# Patient Record
Sex: Female | Born: 1945 | Race: White | Hispanic: No | Marital: Married | Smoking: Never smoker
Health system: Southern US, Community
[De-identification: ages and names within clinical notes are randomized; demographics above are authoritative.]

## PROBLEM LIST (undated history)

## (undated) DIAGNOSIS — M810 Age-related osteoporosis without current pathological fracture: Secondary | ICD-10-CM

## (undated) DIAGNOSIS — E1165 Type 2 diabetes mellitus with hyperglycemia: Secondary | ICD-10-CM

## (undated) DIAGNOSIS — E559 Vitamin D deficiency, unspecified: Secondary | ICD-10-CM

## (undated) DIAGNOSIS — I272 Pulmonary hypertension, unspecified: Secondary | ICD-10-CM

## (undated) DIAGNOSIS — C679 Malignant neoplasm of bladder, unspecified: Secondary | ICD-10-CM

## (undated) DIAGNOSIS — S0990XA Unspecified injury of head, initial encounter: Secondary | ICD-10-CM

## (undated) DIAGNOSIS — I10 Essential (primary) hypertension: Secondary | ICD-10-CM

## (undated) DIAGNOSIS — IMO0001 Reserved for inherently not codable concepts without codable children: Secondary | ICD-10-CM

## (undated) DIAGNOSIS — R413 Other amnesia: Secondary | ICD-10-CM

## (undated) DIAGNOSIS — E538 Deficiency of other specified B group vitamins: Secondary | ICD-10-CM

## (undated) DIAGNOSIS — M87180 Osteonecrosis due to drugs, jaw: Secondary | ICD-10-CM

## (undated) DIAGNOSIS — I480 Paroxysmal atrial fibrillation: Secondary | ICD-10-CM

## (undated) DIAGNOSIS — K219 Gastro-esophageal reflux disease without esophagitis: Secondary | ICD-10-CM

## (undated) DIAGNOSIS — I5031 Acute diastolic (congestive) heart failure: Secondary | ICD-10-CM

## (undated) DIAGNOSIS — E7800 Pure hypercholesterolemia, unspecified: Secondary | ICD-10-CM

## (undated) HISTORY — DX: Reserved for inherently not codable concepts without codable children: IMO0001

## (undated) HISTORY — DX: Type 2 diabetes mellitus with hyperglycemia: E11.65

## (undated) HISTORY — PX: CARDIAC CATHETERIZATION: SHX172

## (undated) HISTORY — DX: Deficiency of other specified B group vitamins: E53.8

## (undated) HISTORY — DX: Vitamin D deficiency, unspecified: E55.9

## (undated) HISTORY — DX: Osteonecrosis due to drugs, jaw: M87.180

## (undated) HISTORY — DX: Unspecified injury of head, initial encounter: S09.90XA

## (undated) HISTORY — DX: Other amnesia: R41.3

## (undated) HISTORY — DX: Malignant neoplasm of bladder, unspecified: C67.9

## (undated) HISTORY — DX: Essential (primary) hypertension: I10

## (undated) HISTORY — DX: Pure hypercholesterolemia, unspecified: E78.00

## (undated) HISTORY — DX: Age-related osteoporosis without current pathological fracture: M81.0

## (undated) HISTORY — PX: MASTECTOMY PARTIAL / LUMPECTOMY: SUR851

## (undated) HISTORY — PX: TONSILLECTOMY: SUR1361

## (undated) HISTORY — DX: Gastro-esophageal reflux disease without esophagitis: K21.9

---

## 1979-04-13 HISTORY — PX: APPENDECTOMY: SHX54

## 1979-04-13 HISTORY — PX: TOTAL ABDOMINAL HYSTERECTOMY: SHX209

## 1998-09-27 ENCOUNTER — Emergency Department (HOSPITAL_COMMUNITY): Admission: EM | Admit: 1998-09-27 | Discharge: 1998-09-27 | Payer: Self-pay | Admitting: Emergency Medicine

## 2010-11-08 ENCOUNTER — Encounter: Payer: Self-pay | Admitting: Cardiovascular Disease

## 2010-11-11 ENCOUNTER — Encounter: Payer: Self-pay | Admitting: Cardiovascular Disease

## 2010-11-17 LAB — PROTIME-INR

## 2010-11-27 LAB — PROTIME-INR

## 2010-12-04 LAB — PROTIME-INR

## 2010-12-13 ENCOUNTER — Encounter: Payer: Self-pay | Admitting: Cardiovascular Disease

## 2010-12-16 ENCOUNTER — Encounter: Payer: Self-pay | Admitting: Cardiovascular Disease

## 2010-12-17 ENCOUNTER — Ambulatory Visit (INDEPENDENT_AMBULATORY_CARE_PROVIDER_SITE_OTHER): Payer: Medicare HMO | Admitting: Cardiovascular Disease

## 2010-12-17 ENCOUNTER — Encounter: Payer: Self-pay | Admitting: Cardiovascular Disease

## 2010-12-17 DIAGNOSIS — I480 Paroxysmal atrial fibrillation: Secondary | ICD-10-CM

## 2010-12-17 DIAGNOSIS — I4891 Unspecified atrial fibrillation: Secondary | ICD-10-CM

## 2010-12-17 DIAGNOSIS — I10 Essential (primary) hypertension: Secondary | ICD-10-CM

## 2010-12-17 DIAGNOSIS — E782 Mixed hyperlipidemia: Secondary | ICD-10-CM

## 2010-12-17 DIAGNOSIS — E119 Type 2 diabetes mellitus without complications: Secondary | ICD-10-CM

## 2010-12-17 NOTE — Patient Instructions (Signed)
Your physician recommends that you schedule a follow-up appointment in: AS NEEDED  Your physician recommends that you continue on your current medications as directed. Please refer to the Current Medication list given to you today.  

## 2010-12-18 DIAGNOSIS — I480 Paroxysmal atrial fibrillation: Secondary | ICD-10-CM | POA: Insufficient documentation

## 2010-12-18 DIAGNOSIS — E782 Mixed hyperlipidemia: Secondary | ICD-10-CM | POA: Insufficient documentation

## 2010-12-18 DIAGNOSIS — E119 Type 2 diabetes mellitus without complications: Secondary | ICD-10-CM | POA: Insufficient documentation

## 2010-12-18 NOTE — Assessment & Plan Note (Signed)
Low carb diet  Target A1c 6.5 or less

## 2010-12-18 NOTE — Assessment & Plan Note (Signed)
F/U labs with Dr Tomasa Blase  Target LDL under 130

## 2010-12-18 NOTE — Assessment & Plan Note (Signed)
Resolved  Continue ASA and beta blocker Stop coumadin

## 2010-12-18 NOTE — Assessment & Plan Note (Signed)
Well controlled.  Continue current medications and low sodium Dash type diet.    

## 2010-12-18 NOTE — Progress Notes (Signed)
65 yo referred from [facility] in [place].  Reviewed records from [facility]  Long hospitalization in July and August for urosepsis with respiratory failure requiring intubation.  Had self limited episode aftrial fibrillation during this time.  Not clear if she converted in hospital or post D/C.  She has been on coumadin since.  [place] score 1.  No palpitations dyspnea or syncope.  Prior to hospitalization had no cardiac history.  Wants to get off coumadin.  Given low [place] score and NSR I think she should be off coumadin.  Arrhythmia clearly ppt by svere sepsis and multisystem failure.    ROS: Denies fever, malais, weight loss, blurry vision, decreased visual acuity, cough, sputum, SOB, hemoptysis, pleuritic pain, palpitaitons, heartburn, abdominal pain, melena, lower extremity edema, claudication, or rash.  All other systems reviewed and negative   General: Affect appropriate Healthy:  appears stated age HEENT: normal Neck supple with no adenopathy JVP normal no bruits no thyromegaly Lungs clear with no wheezing and good diaphragmatic motion Heart:  S1/S2 no murmur,rub, gallop or click PMI normal Abdomen: benighn, BS positve, no tenderness, no AAA no bruit.  No HSM or HJR Distal pulses intact with no bruits No edema Neuro non-focal Skin warm and dry No muscular weakness  Medications Current Outpatient Prescriptions  Medication Sig Dispense Refill  . amLODipine (NORVASC) 10 MG tablet Take 10 mg by mouth daily.        Marland Kitchen atorvastatin (LIPITOR) 10 MG tablet Take 10 mg by mouth daily.        Marland Kitchen glimepiride (AMARYL) 4 MG tablet Take 4 mg by mouth daily before breakfast.        . losartan (COZAAR) 100 MG tablet Take 100 mg by mouth daily.        . metFORMIN (GLUCOPHAGE) 1000 MG tablet Take 1,000 mg by mouth 2 (two) times daily with a meal.        . metoprolol (LOPRESSOR) 50 MG tablet Take 50 mg by mouth as directed. 1 1/2 po bid       . warfarin (COUMADIN) 3 MG tablet Take 3 mg by mouth as directed.           Allergies Ace inhibitors; Codeine; and Januvia  Family History: Family History  Problem Relation Age of Onset  . Coronary artery disease Mother   . Heart disease Father     CHF  . Diabetes Sister   . Thyroid disease Sister   . Arthritis Sister   . Diabetes Sister   . Diabetes Sister     Social History: History   Social History  . Marital Status: Married    Spouse Name: N/A    Number of Children: N/A  . Years of Education: N/A   Occupational History  . Not on file.   Social History Main Topics  . Smoking status: Never Smoker   . Smokeless tobacco: Not on file  . Alcohol Use: No  . Drug Use: No  . Sexually Active:    Other Topics Concern  . Not on file   Social History Narrative   Married 3 sonsCurrent work status : Unemployed, not looking for [employer] illicit drug useAlcohol use : No alcohol useTobacco use: Never smoke    Electrocardiogram:  NSR normal ECG  Assessment and Plan

## 2011-12-29 LAB — PULMONARY FUNCTION TEST

## 2012-01-20 ENCOUNTER — Encounter: Payer: Self-pay | Admitting: *Deleted

## 2012-01-21 ENCOUNTER — Encounter: Payer: Self-pay | Admitting: Internal Medicine

## 2012-01-21 ENCOUNTER — Ambulatory Visit (INDEPENDENT_AMBULATORY_CARE_PROVIDER_SITE_OTHER): Payer: Medicare Other | Admitting: Internal Medicine

## 2012-01-21 VITALS — BP 140/82 | HR 73 | Temp 97.8°F | Ht 64.5 in | Wt 177.2 lb

## 2012-01-21 DIAGNOSIS — R0989 Other specified symptoms and signs involving the circulatory and respiratory systems: Secondary | ICD-10-CM

## 2012-01-21 DIAGNOSIS — R0609 Other forms of dyspnea: Secondary | ICD-10-CM

## 2012-01-21 DIAGNOSIS — R942 Abnormal results of pulmonary function studies: Secondary | ICD-10-CM

## 2012-01-21 DIAGNOSIS — R0600 Dyspnea, unspecified: Secondary | ICD-10-CM

## 2012-01-21 NOTE — Progress Notes (Signed)
Subjective:    Patient ID: Ann Hunter, female    DOB: 1945/04/14, 66 y.o.   MRN: [id number]  HPI  PCP is HAMRICK,MAURA L, MD  Estimated Body mass index is 29.95 kg/(m^2) .  reports that she has never smoked. She does not have any smokeless tobacco history on file.   IOV 01/21/2012  Referred for dyspnea. Hx by her and husband; both not great historian  A year ago was hopsitalized with Acute vent dependent resp failure due [facility] was on coumadin but ? Due to A Fib. Saw [facility] cards and coumadin dc'ed. Been doing well since then but husband thinks she might have had insidious onset mild baseline dyspea (she denies this). Then a month ago, she ate some food she was allergic to at [employer] several weeks ago. Rushed to ER, found to hav angioedema of tongue (they are not sure if she was on ace inhibitor at that time but they do not think so). BP was 200s sbp. She was discharged on 7 day pednisone taper. Since then dyspneic. Walking in a grocery story makes her dyspneic. When dyspneic, symptoms are severe and she holds her chest due to discomfort that they deny is pain. Symptoms certainly relieved by rest. However, past week started on hydralazine and they think since then dyspnea might be better but not sure. PFTs (below) at PMD office show restriction with low diffusion . CXR at [facility] reported as low lung volumes (N/A for my review). However, walking desat test here no desaturations    FVC fev1 ratio BD fev1 TLC DLCO comment  12/29/2011 2.1L/69% 1.6L/69% 77/100% Noone 3L/59% 14/25% Loop & effort ok     Past Medical History  Diagnosis Date  . Osteoporosis, unspecified   . Pure hypercholesterolemia   . Essential hypertension, benign   . Unspecified vitamin D deficiency   . Head injury, unspecified   . Type II or unspecified type diabetes mellitus without mention of complication, uncontrolled   . Atrial fibrillation   . B12 deficiency   . Closed  head injury   . Memory difficulty     d/t head injury     Family History  Problem Relation Age of Onset  . Coronary artery disease Mother   . Heart disease Father     CHF  . Diabetes Sister   . Thyroid disease Sister   . Arthritis Sister   . Diabetes Sister   . Diabetes Sister      History   Social History  . Marital Status: Married    Spouse Name: N/A    Number of Children: N/A  . Years of Education: N/A   Occupational History  . Not on file.   Social History Main Topics  . Smoking status: Never Smoker   . Smokeless tobacco: Not on file  . Alcohol Use: No  . Drug Use: No  . Sexually Active:    Other Topics Concern  . Not on file   Social History Narrative   Married 3 sonsCurrent work status : Unemployed, not looking for [employer] illicit drug useAlcohol use : No alcohol useTobacco use: Never smoke     Allergies  Allergen Reactions  . Codeine Shortness Of Breath  . Ace Inhibitors   . Januvia (Sitagliptin Phosphate)      Outpatient Prescriptions Prior to Visit  Medication Sig Dispense Refill  . glimepiride (AMARYL) 4 MG tablet Take 4 mg by mouth daily before breakfast.        .  losartan (COZAAR) 100 MG tablet Take 100 mg by mouth daily.        . metFORMIN (GLUCOPHAGE) 1000 MG tablet Take 1,000 mg by mouth 2 (two) times daily with a meal.        . amLODipine (NORVASC) 10 MG tablet Take 10 mg by mouth daily.        Marland Kitchen atorvastatin (LIPITOR) 10 MG tablet Take 10 mg by mouth daily.        . metoprolol (LOPRESSOR) 50 MG tablet Take 50 mg by mouth as directed. 1 1/2 po bid       . warfarin (COUMADIN) 3 MG tablet Take 3 mg by mouth as directed.             Review of Systems  Constitutional: Negative for fever and unexpected weight change.  HENT: Negative for ear pain, nosebleeds, congestion, sore throat, rhinorrhea, sneezing, trouble swallowing, dental problem, postnasal drip and sinus pressure.   Eyes: Negative for redness and itching.  Respiratory: Positive  for shortness of breath. Negative for cough, chest tightness and wheezing.   Cardiovascular: Negative for palpitations and leg swelling.  Gastrointestinal: Negative for nausea and vomiting.  Genitourinary: Negative for dysuria.  Musculoskeletal: Negative for joint swelling.  Skin: Negative for rash.  Neurological: Negative for headaches.  Hematological: Does not bruise/bleed easily.  Psychiatric/Behavioral: Negative for dysphoric mood. The patient is not nervous/anxious.    Filed Vitals:   01/21/12 1330  Height: 5' 4.5" (1.638 m)  Weight: 80.377 kg (177 lb 3.2 oz)   Filed Vitals:   01/21/12 1330  BP: 140/82  Pulse: 73  Temp: 97.8 F (36.6 C)  TempSrc: Oral  Height: 5' 4.5" (1.638 m)  Weight: 80.377 kg (177 lb 3.2 oz)  SpO2: 95%       Objective:   Physical Exam  Vitals reviewed. Constitutional: She is oriented to person, place, and time. She appears well-developed and well-nourished. No distress.       Body mass index is 29.95 kg/(m^2).   HENT:  Head: Normocephalic and atraumatic.  Right Ear: External ear normal.  Left Ear: External ear normal.  Mouth/Throat: Oropharynx is clear and moist. No oropharyngeal exudate.  Eyes: Conjunctivae normal and EOM are normal. Pupils are equal, round, and reactive to light. Right eye exhibits no discharge. Left eye exhibits no discharge. No scleral icterus.  Neck: Normal range of motion. Neck supple. No JVD present. No tracheal deviation present. No thyromegaly present.       Post nasal drip +  Cardiovascular: Normal rate, regular rhythm, normal heart sounds and intact distal pulses.  Exam reveals no gallop and no friction rub.   No murmur heard. Pulmonary/Chest: Effort normal and breath sounds normal. No respiratory distress. She has no wheezes. She has no rales. She exhibits no tenderness.  Abdominal: Soft. Bowel sounds are normal. She exhibits no distension and no mass. There is no tenderness. There is no rebound and no guarding.    Musculoskeletal: Normal range of motion. She exhibits no edema and no tenderness.       OA +  Lymphadenopathy:    She has no cervical adenopathy.  Neurological: She is alert and oriented to person, place, and time. She has normal reflexes. No cranial nerve deficit. She exhibits normal muscle tone. Coordination normal.  Skin: Skin is warm and dry. No rash noted. She is not diaphoretic. No erythema. No pallor.  Psychiatric: She has a normal mood and affect. Her behavior is normal. Judgment and thought content  normal.          Assessment & Plan:

## 2012-01-21 NOTE — Assessment & Plan Note (Signed)
Poor historian but has recent onset class 2 doe with ? Chest pain. PFTs show low dlco and restriction suggestive of ILD but she did not desaturate nor did I hear crackles. Will get CT chest ILD protocol. If negative, will ensure no coronar issues. IF that too is negative, will need pulmnary stress test with EIB challenge. Will call with CT results

## 2012-01-21 NOTE — Patient Instructions (Addendum)
pleaes have CT scan chest Will call you with results If these are negative, you might have to see cardiology or do pulmonary stress test

## 2012-01-27 ENCOUNTER — Ambulatory Visit (INDEPENDENT_AMBULATORY_CARE_PROVIDER_SITE_OTHER)
Admission: RE | Admit: 2012-01-27 | Discharge: 2012-01-27 | Disposition: A | Discharge status: Home / Self Care | Payer: Medicare Other | Source: Ambulatory Visit | Attending: Internal Medicine | Admitting: Internal Medicine

## 2012-01-27 DIAGNOSIS — R0600 Dyspnea, unspecified: Secondary | ICD-10-CM

## 2012-01-27 DIAGNOSIS — R942 Abnormal results of pulmonary function studies: Secondary | ICD-10-CM

## 2012-01-27 DIAGNOSIS — R0609 Other forms of dyspnea: Secondary | ICD-10-CM

## 2012-01-28 ENCOUNTER — Telehealth: Payer: Self-pay | Admitting: Internal Medicine

## 2012-01-28 DIAGNOSIS — R0600 Dyspnea, unspecified: Secondary | ICD-10-CM

## 2012-01-28 DIAGNOSIS — J849 Interstitial pulmonary disease, unspecified: Secondary | ICD-10-CM

## 2012-01-28 DIAGNOSIS — R079 Chest pain, unspecified: Secondary | ICD-10-CM

## 2012-01-28 NOTE — Telephone Encounter (Signed)
Ct chest 10/17.13 shows slight scarring in lungs  - so do autoimmine blood work; will call with results  Also, the ct and pft findings do not explain her symptoms completely. She has been clutching chest so refer [facility] cardiology  Thanks  MR (orders done)   Ct Chest Wo Contrast  01/27/2012  *RADIOLOGY REPORT*  Clinical Data: SOB, abnormal PFTs, evaluate for interstitial lung disease  CT CHEST WITHOUT CONTRAST  Technique:  Multidetector CT imaging of the chest was performed following the standard protocol without IV contrast.  Comparison: Chest radiographs dated 12/23/2011.  Findings: Very mild subpleural reticulation in the bilateral lower lobes.  No associated traction bronchiectasis or honeycombing.  No focal consolidation.  No suspicious pulmonary nodules. No pleural effusion or pneumothorax.  On expiratory / expiratory imaging, there is very mild air trapping.  Heart is normal in size.  No pericardial effusion.  Coronary atherosclerosis.  Atherosclerotic calcifications of the aortic arch.  No suspicious mediastinal or axillary lymphadenopathy.  Visualized upper abdomen is unremarkable.  Mild degenerative changes of the visualized thoracolumbar spine.  IMPRESSION: Very mild subpleural reticulation in the bilateral lower lobes, possibly reflecting very early chronic interstitial lung disease, nonspecific.  Associated mild air trapping.  No findings to suggest end-stage lung disease/advanced idiopathic pulmonary fibrosis.   Original Report Authenticated By: Charline Bills, M.D.

## 2012-02-01 NOTE — Telephone Encounter (Signed)
Pt advised. Jennifer Castillo, CMA  

## 2012-02-01 NOTE — Telephone Encounter (Signed)
[id number].Jennifer Castillo, CMA  

## 2012-02-02 ENCOUNTER — Other Ambulatory Visit (INDEPENDENT_AMBULATORY_CARE_PROVIDER_SITE_OTHER): Payer: Medicare Other

## 2012-02-02 DIAGNOSIS — R0600 Dyspnea, unspecified: Secondary | ICD-10-CM

## 2012-02-02 DIAGNOSIS — R0989 Other specified symptoms and signs involving the circulatory and respiratory systems: Secondary | ICD-10-CM

## 2012-02-02 DIAGNOSIS — R079 Chest pain, unspecified: Secondary | ICD-10-CM

## 2012-02-02 DIAGNOSIS — J8410 Pulmonary fibrosis, unspecified: Secondary | ICD-10-CM

## 2012-02-02 DIAGNOSIS — J849 Interstitial pulmonary disease, unspecified: Secondary | ICD-10-CM

## 2012-02-02 DIAGNOSIS — R0609 Other forms of dyspnea: Secondary | ICD-10-CM

## 2012-02-02 LAB — CK: Total CK: 35 U/L (ref 7–177)

## 2012-02-02 LAB — SEDIMENTATION RATE: Sed Rate: 21 mm/hr (ref 0–22)

## 2012-02-03 LAB — ANCA SCREEN W REFLEX TITER
Atypical p-ANCA Screen: NEGATIVE
p-ANCA Screen: NEGATIVE

## 2012-02-03 LAB — CYCLIC CITRUL PEPTIDE ANTIBODY, IGG: Cyclic Citrullin Peptide Ab: <2 U/mL (ref 0.0–5.0)

## 2012-02-03 LAB — SJOGRENS SYNDROME-B EXTRACTABLE NUCLEAR ANTIBODY: SSB (La) (ENA) Antibody, IgG: <1 AU/mL (ref <30)

## 2012-02-07 ENCOUNTER — Encounter: Payer: Self-pay | Admitting: Cardiology

## 2012-02-08 LAB — HYPERSENSITIVITY PNUEMONITIS PROFILE

## 2012-02-15 ENCOUNTER — Telehealth: Payer: Self-pay | Admitting: Internal Medicine

## 2012-02-15 NOTE — Telephone Encounter (Signed)
appt set for 03-07-12.Carron Curie, CMA

## 2012-02-15 NOTE — Telephone Encounter (Signed)
Autoimmune panel 02/02/12 normal. They should see me after they see cards on 02/25/12. Give them appt

## 2012-02-25 ENCOUNTER — Ambulatory Visit (INDEPENDENT_AMBULATORY_CARE_PROVIDER_SITE_OTHER): Payer: Medicare Other | Admitting: Cardiology

## 2012-02-25 ENCOUNTER — Encounter: Payer: Self-pay | Admitting: Cardiology

## 2012-02-25 VITALS — BP 134/62 | HR 78 | Ht 64.0 in | Wt 159.0 lb

## 2012-02-25 DIAGNOSIS — R0609 Other forms of dyspnea: Secondary | ICD-10-CM

## 2012-02-25 DIAGNOSIS — I480 Paroxysmal atrial fibrillation: Secondary | ICD-10-CM

## 2012-02-25 DIAGNOSIS — I4891 Unspecified atrial fibrillation: Secondary | ICD-10-CM

## 2012-02-25 DIAGNOSIS — R0600 Dyspnea, unspecified: Secondary | ICD-10-CM

## 2012-02-25 DIAGNOSIS — E782 Mixed hyperlipidemia: Secondary | ICD-10-CM

## 2012-02-25 DIAGNOSIS — I10 Essential (primary) hypertension: Secondary | ICD-10-CM

## 2012-02-25 NOTE — Progress Notes (Signed)
HPI: pleasant female previously seen by Dr. Eden Emms for followup of dyspnea. Patient apparently had an episode of atrial fibrillation in the setting of urosepsis previously. Dr. Eden Emms discontinued her Coumadin as he felt her atrial fibrillation was due to her acute illness and her CHADS score was 1. Chest CT in October 2013 showed possible early interstitial lung disease. Pulmonary function tests showed low DLCO and restriction suggestive of interstitial lung disease. Patient recently had episodes of dyspnea that she attributes to a blood pressure medication. Her symptoms have improved. She denies dyspnea on exertion unless extreme activities. No orthopnea, PND, pedal edema, palpitations, syncope or chest pain. We were asked to evaluate for her dyspnea.  Current Outpatient Prescriptions  Medication Sig Dispense Refill  . carvedilol (COREG) 25 MG tablet Take 25 mg by mouth 2 (two) times daily with a meal.      . glimepiride (AMARYL) 4 MG tablet Take 4 mg by mouth 2 (two) times daily.       . hydrALAZINE (APRESOLINE) 25 MG tablet Take 1 tablet by mouth Twice daily.      Marland Kitchen losartan (COZAAR) 100 MG tablet Take 100 mg by mouth daily.        . magnesium oxide (MAG-OX) 400 MG tablet Take 400 mg by mouth daily.      . metFORMIN (GLUCOPHAGE) 1000 MG tablet Take 1,000 mg by mouth 2 (two) times daily with a meal.        . pravastatin (PRAVACHOL) 20 MG tablet Take 1 tablet by mouth Daily.      . vitamin B-12 (CYANOCOBALAMIN) 1000 MCG tablet Take 1,000 mcg by mouth 2 (two) times daily.         Past Medical History  Diagnosis Date  . Osteoporosis, unspecified   . Pure hypercholesterolemia   . Essential hypertension, benign   . Unspecified vitamin D deficiency   . Head injury, unspecified   . Type II or unspecified type diabetes mellitus without mention of complication, uncontrolled   . Atrial fibrillation   . B12 deficiency   . Closed head injury   . Memory difficulty     d/t head injury    Past  Surgical History  Procedure Date  . Appendectomy 1981  . Tonsillectomy   . Total abdominal hysterectomy     History   Social History  . Marital Status: Married    Spouse Name: N/A    Number of Children: N/A  . Years of Education: N/A   Occupational History  . Not on file.   Social History Main Topics  . Smoking status: Never Smoker   . Smokeless tobacco: Not on file  . Alcohol Use: No  . Drug Use: No  . Sexually Active:    Other Topics Concern  . Not on file   Social History Narrative   Married 3 sonsCurrent work status : Unemployed, not looking for [employer] illicit drug useAlcohol use : No alcohol useTobacco use: Never smoke    ROS: no fevers or chills, productive cough, hemoptysis, dysphasia, odynophagia, melena, hematochezia, dysuria, hematuria, rash, seizure activity, orthopnea, PND, pedal edema, claudication. Remaining systems are negative.  Physical Exam: Well-developed well-nourished in no acute distress.  Skin is warm and dry.  HEENT is normal.  Neck is supple.  Chest is clear to auscultation with normal expansion.  Cardiovascular exam is regular rate and rhythm.  Abdominal exam nontender or distended. No masses palpated. Extremities show no edema. neuro grossly intact  ECG 12/22/2011-sinus rhythm with nonspecific  lateral T-wave changes.

## 2012-02-25 NOTE — Patient Instructions (Addendum)
Your physician recommends that you schedule a follow-up appointment in: AS NEEDED PENDING TEST RESULTS  Your physician has requested that you have en exercise stress myoview. For further information please visit [email or web address]. Please follow instruction sheet, as given.    

## 2012-02-25 NOTE — Assessment & Plan Note (Signed)
Patient had only one episode that occurred in the setting of urosepsis.

## 2012-02-25 NOTE — Assessment & Plan Note (Signed)
Blood pressure controlled. Continue present medications. 

## 2012-02-25 NOTE — Assessment & Plan Note (Signed)
Continue statin. Management per primary care. 

## 2012-02-25 NOTE — Assessment & Plan Note (Signed)
Symptoms appear to be improving after changing blood pressure medications. However she has 20 years of diabetes mellitus. Plan stress Myoview to quantify LV function and to exclude ischemia.

## 2012-02-29 ENCOUNTER — Telehealth: Payer: Self-pay | Admitting: Cardiology

## 2012-02-29 NOTE — Telephone Encounter (Signed)
Spoke with pt, not sure who called. Her questions answered.

## 2012-02-29 NOTE — Telephone Encounter (Signed)
New Problem:    Patient returned a call.  Please call back.

## 2012-03-07 ENCOUNTER — Ambulatory Visit: Payer: Medicare Other | Admitting: Internal Medicine

## 2012-03-13 ENCOUNTER — Ambulatory Visit (HOSPITAL_COMMUNITY): Payer: Medicare Other | Attending: Cardiology | Admitting: Radiology

## 2012-03-13 VITALS — BP 200/90 | Ht 64.0 in | Wt 157.0 lb

## 2012-03-13 DIAGNOSIS — Z8249 Family history of ischemic heart disease and other diseases of the circulatory system: Secondary | ICD-10-CM | POA: Insufficient documentation

## 2012-03-13 DIAGNOSIS — I10 Essential (primary) hypertension: Secondary | ICD-10-CM | POA: Insufficient documentation

## 2012-03-13 DIAGNOSIS — I4891 Unspecified atrial fibrillation: Secondary | ICD-10-CM | POA: Insufficient documentation

## 2012-03-13 DIAGNOSIS — R0600 Dyspnea, unspecified: Secondary | ICD-10-CM

## 2012-03-13 DIAGNOSIS — R0602 Shortness of breath: Secondary | ICD-10-CM

## 2012-03-13 DIAGNOSIS — E119 Type 2 diabetes mellitus without complications: Secondary | ICD-10-CM | POA: Insufficient documentation

## 2012-03-13 MED ORDER — TECHNETIUM TC 99M SESTAMIBI GENERIC - CARDIOLITE
33.0000 | Freq: Once | INTRAVENOUS | Status: AC | PRN
  Administered 2012-03-13: 33 via INTRAVENOUS

## 2012-03-13 MED ORDER — REGADENOSON 0.4 MG/5ML IV SOLN
0.4000 mg | Freq: Once | INTRAVENOUS | Status: AC
  Administered 2012-03-13: 0.4 mg via INTRAVENOUS

## 2012-03-13 MED ORDER — TECHNETIUM TC 99M SESTAMIBI GENERIC - CARDIOLITE
11.0000 | Freq: Once | INTRAVENOUS | Status: AC | PRN
  Administered 2012-03-13: 11 via INTRAVENOUS

## 2012-03-13 NOTE — Progress Notes (Signed)
[facility] SITE 3 NUCLEAR MED [id number] [place] [address] [phone]  Cardiology Nuclear Med Study  Ann Hunter is a 66 y.o. female     MRN : [id number]     DOB: 05-03-1945  Procedure Date: 03/13/2012  Nuclear Med Background Indication for Stress Test:  Evaluation for Ischemia and Abnormal EKG:12/22/11 with nonspecific lateral waves changes History:  AFIB Cardiac Risk Factors: Family History - CAD, Hypertension, Lipids and NIDDM  Symptoms:  SOB   Nuclear Pre-Procedure Caffeine/Decaff Intake:  None NPO After: 7:00pm   Lungs:  clear O2 Sat: 95% on room air. IV 0.9% NS with Angio Cath:  22g  IV Site: R Wrist  IV Started by:  Cathlyn Parsons, RN  Chest Size (in):  42 Cup Size: D  Height: 5\' 4"  (1.626 m)  Weight:  157 lb (71.215 kg)  BMI:  Body mass index is 26.95 kg/(m^2). Tech Comments:  Coreg held x 14 hrs    Nuclear Med Study 1 or 2 day study: 1 day  Stress Test Type:  Lexiscan  Reading MD: Willa Rough, MD  Order Authorizing Provider:  Ripley Fraise  Resting Radionuclide: Technetium 79m Sestamibi  Resting Radionuclide Dose: 11.0 mCi   Stress Radionuclide:  Technetium 26m Sestamibi  Stress Radionuclide Dose: 33.0 mCi           Stress Protocol Rest HR: 72 Stress HR: 94  Rest BP: 200/90 Stress BP: 180/76  Exercise Time (min): n/a METS: n/a   Predicted Max HR: 153 bpm % Max HR: 61.44 bpm Rate Pressure Product: 62952   Dose of Adenosine (mg):  n/a Dose of Lexiscan: 0.4 mg  Dose of Atropine (mg): n/a Dose of Dobutamine: n/a mcg/kg/min (at max HR)  Stress Test Technologist: Frederick Peers, EMT-P  Nuclear Technologist:  Domenic Polite, CNMT     Rest Procedure:  Myocardial perfusion imaging was performed at rest 45 minutes following the intravenous administration of Technetium 20m Sestamibi. Rest ECG: NSR - Normal EKG  Stress Procedure:  The patient received IV Lexiscan 0.4 mg over 15-seconds.  Technetium 69m  Tetrofosmin injected at 30-seconds.  There were no significant changes with Lexiscan.  Quantitative spect images were obtained after a 45 minute delay. Stress ECG: No significant change from baseline ECG  QPS Raw Data Images:  Normal; no motion artifact; normal heart/lung ratio. Stress Images:  Normal homogeneous uptake in all areas of the myocardium. Rest Images:  Normal homogeneous uptake in all areas of the myocardium. Subtraction (SDS):  No evidence of ischemia. Transient Ischemic Dilatation (Normal <1.22):  0.93 Lung/Heart Ratio (Normal <0.45):  0.20  Quantitative Gated Spect Images QGS EDV:  80 ml QGS ESV:  22 ml  Impression Exercise Capacity:  Lexiscan with no exercise. BP Response:  Normal blood pressure response. Clinical Symptoms:  nausea ECG Impression:  No significant ST segment change suggestive of ischemia. Comparison with Prior Nuclear Study: No images to compare  Overall Impression:  Normal stress nuclear study.  LV Ejection Fraction: 73%.  LV Wall Motion:  Normal Wall Motion.  Willa Rough, MD

## 2012-03-15 ENCOUNTER — Telehealth: Payer: Self-pay | Admitting: Cardiology

## 2012-03-15 NOTE — Telephone Encounter (Signed)
New Problem:    Patient returned your call about her test results.  Please call back.

## 2012-03-15 NOTE — Telephone Encounter (Signed)
Spoke with pt, aware of nuclear results 

## 2012-03-27 ENCOUNTER — Encounter: Payer: Self-pay | Admitting: Internal Medicine

## 2012-03-27 ENCOUNTER — Ambulatory Visit (INDEPENDENT_AMBULATORY_CARE_PROVIDER_SITE_OTHER): Payer: Medicare Other | Admitting: Internal Medicine

## 2012-03-27 VITALS — BP 140/80 | HR 70 | Temp 97.7°F | Ht 64.5 in | Wt 162.8 lb

## 2012-03-27 DIAGNOSIS — R0609 Other forms of dyspnea: Secondary | ICD-10-CM

## 2012-03-27 DIAGNOSIS — R0600 Dyspnea, unspecified: Secondary | ICD-10-CM

## 2012-03-27 NOTE — Assessment & Plan Note (Signed)
Was probably related to ace inhibitor allergy. Basal mild scarring on CT oct 2013 probably due to VDRF in 2012. Autoimmune negative. STress test negative cardiac in 2013.  Currently dyspnea resolved  Plan Dc from activ followup

## 2012-03-27 NOTE — Progress Notes (Signed)
Subjective:    Patient ID: Ann Hunter, female    DOB: 05/21/1945, 66 y.o.   MRN: [id number]  HPI PCP is HAMRICK,MAURA L, MD  Estimated Body mass index is 29.95 kg/(m^2) .  reports that she has never smoked. She does not have any smokeless tobacco history on file.   IOV 01/21/2012  Referred for dyspnea. Hx by her and husband; both not great historian  A year ago was hopsitalized with Acute vent dependent resp failure due to [facility]. Post dc was on coumadin but ? Due to A Fib. Saw [facility] cards and coumadin dc'ed. Been doing well since then but husband thinks she might have had insidious onset mild baseline dyspea (she denies this). Then a month ago, she ate some food she was allergic to at [employer] several weeks ago. Rushed to ER, found to hav angioedema of tongue (they are not sure if she was on ace inhibitor at that time but they do not think so). BP was 200s sbp. She was discharged on 7 day pednisone taper. Since then dyspneic. Walking in a grocery story makes her dyspneic. When dyspneic, symptoms are severe and she holds her chest due to discomfort that they deny is pain. Symptoms certainly relieved by rest. However, past week started on hydralazine and they think since then dyspnea might be better but not sure. PFTs (below) at PMD office show restriction with low diffusion . CXR at [place] reported as low lung volumes (N/A for my review). However, walking desat test here no desaturations    FVC fev1 ratio BD fev1 TLC DLCO comment  12/29/2011 2.1L/69% 1.6L/69% 77/100% Noone 3L/59% 14/25% Loop & effort ok   TElephone call 01/28/12 REC Ct chest 10/17.13 shows slight scarring in lungs  - so do autoimmine blood work; will call with results  Also, the ct and pft findings do not explain her symptoms completely. She has been clutching chest so refer [place] cardiology  Thanks  MR   OV 03/27/2012   FU dyspnea   Since last visit: had PFT - showed  isolated low diffusion. So we did CT- showed basal scarring. Autoimmune tests negative. So, scarring likely due to VDRF a year ago. Then referred to cardiology - had stress test earlier this month - reportedly normal "heart of a 66 year old".  Currently dyspnea is resolved.  She feels PMD stopping ace inibitor just before her first visit here in October made the difference. She denies associatd cough, wheeze, edema, orthopnea, paroxysmal nocturnal dyspnea.   Past, Family, Social reviewed: new right ankle strain - thanksgiving 2013  Review of Systems  Constitutional: Negative for fever and unexpected weight change.  HENT: Negative for ear pain, nosebleeds, congestion, sore throat, rhinorrhea, sneezing, trouble swallowing, dental problem, postnasal drip and sinus pressure.   Eyes: Negative for redness and itching.  Respiratory: Negative for cough, chest tightness, shortness of breath and wheezing.   Cardiovascular: Negative for palpitations and leg swelling.  Gastrointestinal: Negative for nausea and vomiting.  Genitourinary: Negative for dysuria.  Musculoskeletal: Negative for joint swelling.  Skin: Negative for rash.  Neurological: Negative for headaches.  Hematological: Does not bruise/bleed easily.  Psychiatric/Behavioral: Negative for dysphoric mood. The patient is not nervous/anxious.        Objective:   Physical Exam Vitals reviewed. Constitutional: She is oriented to person, place, and time. She appears well-developed and well-nourished. No distress.      Body mass index is 27.51 kg/(m^2).  HENT:  Head: Normocephalic and atraumatic.  Right Ear: External ear normal.  Left Ear: External ear normal.  Mouth/Throat: Oropharynx is clear and moist. No oropharyngeal exudate.  Eyes: Conjunctivae normal and EOM are normal. Pupils are equal, round, and reactive to light. Right eye exhibits no discharge. Left eye exhibits no discharge. No scleral icterus.  Neck: Normal range of motion.  Neck supple. No JVD present. No tracheal deviation present. No thyromegaly present.       Post nasal drip resolved +  Cardiovascular: Normal rate, regular rhythm, normal heart sounds and intact distal pulses.  Exam reveals no gallop and no friction rub.   No murmur heard. Pulmonary/Chest: Effort normal and breath sounds normal. No respiratory distress. She has no wheezes. She has no rales. She exhibits no tenderness.  Abdominal: Soft. Bowel sounds are normal. She exhibits no distension and no mass. There is no tenderness. There is no rebound and no guarding.  Musculoskeletal: Normal range of motion. She exhibits no edema and no tenderness.  RT ANKLE IN CAST +      OA +  Lymphadenopathy:    She has no cervical adenopathy.  Neurological: She is alert and oriented to person, place, and time. She has normal reflexes. No cranial nerve deficit. She exhibits normal muscle tone. Coordination normal.  Skin: Skin is warm and dry. No rash noted. She is not diaphoretic. No erythema. No pallor.  Psychiatric: She has a normal mood and affect. Her behavior is normal. Judgment and thought content normal.          Assessment & Plan:

## 2012-03-27 NOTE — Patient Instructions (Addendum)
Glad your shortness of breath resolved after stopping ace inhibitor medications CT chest shows some scarring probably related to the pneumonia you had in 2012 THere is no need for followup here unless you get breathing problems again

## 2013-10-14 ENCOUNTER — Inpatient Hospital Stay (HOSPITAL_COMMUNITY)
Admission: EM | Admit: 2013-10-14 | Discharge: 2013-10-16 | DRG: 872 | Disposition: A | Discharge status: Home / Self Care | Payer: Medicare Other | Attending: Internal Medicine | Admitting: Internal Medicine

## 2013-10-14 ENCOUNTER — Encounter (HOSPITAL_COMMUNITY): Payer: Self-pay | Admitting: Emergency Medicine

## 2013-10-14 ENCOUNTER — Emergency Department (HOSPITAL_COMMUNITY): Payer: Medicare Other

## 2013-10-14 DIAGNOSIS — Z833 Family history of diabetes mellitus: Secondary | ICD-10-CM

## 2013-10-14 DIAGNOSIS — E538 Deficiency of other specified B group vitamins: Secondary | ICD-10-CM | POA: Diagnosis present

## 2013-10-14 DIAGNOSIS — I4891 Unspecified atrial fibrillation: Secondary | ICD-10-CM | POA: Diagnosis present

## 2013-10-14 DIAGNOSIS — E559 Vitamin D deficiency, unspecified: Secondary | ICD-10-CM | POA: Diagnosis present

## 2013-10-14 DIAGNOSIS — Z888 Allergy status to other drugs, medicaments and biological substances status: Secondary | ICD-10-CM

## 2013-10-14 DIAGNOSIS — N10 Acute tubulo-interstitial nephritis: Secondary | ICD-10-CM | POA: Diagnosis present

## 2013-10-14 DIAGNOSIS — Z8249 Family history of ischemic heart disease and other diseases of the circulatory system: Secondary | ICD-10-CM

## 2013-10-14 DIAGNOSIS — M810 Age-related osteoporosis without current pathological fracture: Secondary | ICD-10-CM | POA: Diagnosis present

## 2013-10-14 DIAGNOSIS — K219 Gastro-esophageal reflux disease without esophagitis: Secondary | ICD-10-CM | POA: Diagnosis present

## 2013-10-14 DIAGNOSIS — I10 Essential (primary) hypertension: Secondary | ICD-10-CM

## 2013-10-14 DIAGNOSIS — Z880 Allergy status to penicillin: Secondary | ICD-10-CM

## 2013-10-14 DIAGNOSIS — Z8261 Family history of arthritis: Secondary | ICD-10-CM

## 2013-10-14 DIAGNOSIS — I480 Paroxysmal atrial fibrillation: Secondary | ICD-10-CM

## 2013-10-14 DIAGNOSIS — I129 Hypertensive chronic kidney disease with stage 1 through stage 4 chronic kidney disease, or unspecified chronic kidney disease: Secondary | ICD-10-CM | POA: Diagnosis present

## 2013-10-14 DIAGNOSIS — E7800 Pure hypercholesterolemia, unspecified: Secondary | ICD-10-CM | POA: Diagnosis present

## 2013-10-14 DIAGNOSIS — A419 Sepsis, unspecified organism: Secondary | ICD-10-CM | POA: Diagnosis present

## 2013-10-14 DIAGNOSIS — Z9089 Acquired absence of other organs: Secondary | ICD-10-CM

## 2013-10-14 DIAGNOSIS — A4151 Sepsis due to Escherichia coli [E. coli]: Principal | ICD-10-CM | POA: Diagnosis present

## 2013-10-14 DIAGNOSIS — E119 Type 2 diabetes mellitus without complications: Secondary | ICD-10-CM | POA: Diagnosis present

## 2013-10-14 DIAGNOSIS — E782 Mixed hyperlipidemia: Secondary | ICD-10-CM

## 2013-10-14 DIAGNOSIS — E871 Hypo-osmolality and hyponatremia: Secondary | ICD-10-CM | POA: Diagnosis present

## 2013-10-14 DIAGNOSIS — E11319 Type 2 diabetes mellitus with unspecified diabetic retinopathy without macular edema: Secondary | ICD-10-CM

## 2013-10-14 DIAGNOSIS — N182 Chronic kidney disease, stage 2 (mild): Secondary | ICD-10-CM | POA: Diagnosis present

## 2013-10-14 DIAGNOSIS — Q619 Cystic kidney disease, unspecified: Secondary | ICD-10-CM

## 2013-10-14 DIAGNOSIS — R0600 Dyspnea, unspecified: Secondary | ICD-10-CM

## 2013-10-14 DIAGNOSIS — E785 Hyperlipidemia, unspecified: Secondary | ICD-10-CM | POA: Diagnosis present

## 2013-10-14 DIAGNOSIS — Z79899 Other long term (current) drug therapy: Secondary | ICD-10-CM

## 2013-10-14 DIAGNOSIS — N179 Acute kidney failure, unspecified: Secondary | ICD-10-CM | POA: Diagnosis present

## 2013-10-14 DIAGNOSIS — N200 Calculus of kidney: Secondary | ICD-10-CM | POA: Diagnosis present

## 2013-10-14 DIAGNOSIS — R6510 Systemic inflammatory response syndrome (SIRS) of non-infectious origin without acute organ dysfunction: Secondary | ICD-10-CM | POA: Diagnosis present

## 2013-10-14 DIAGNOSIS — Z882 Allergy status to sulfonamides status: Secondary | ICD-10-CM

## 2013-10-14 DIAGNOSIS — N12 Tubulo-interstitial nephritis, not specified as acute or chronic: Secondary | ICD-10-CM | POA: Diagnosis present

## 2013-10-14 HISTORY — DX: Paroxysmal atrial fibrillation: I48.0

## 2013-10-14 LAB — URINE MICROSCOPIC-ADD ON

## 2013-10-14 LAB — COMPREHENSIVE METABOLIC PANEL
ALBUMIN: 3.3 g/dL — AB (ref 3.5–5.2)
ALT: 9 U/L (ref 0–35)
ANION GAP: 19 — AB (ref 5–15)
AST: 16 U/L (ref 0–37)
Alkaline Phosphatase: 63 U/L (ref 39–117)
BUN: 32 mg/dL — AB (ref 6–23)
CALCIUM: 9.6 mg/dL (ref 8.4–10.5)
CO2: 21 mEq/L (ref 19–32)
CREATININE: 1.51 mg/dL — AB (ref 0.50–1.10)
Chloride: 95 mEq/L — ABNORMAL LOW (ref 96–112)
GFR calc Af Amer: 40 mL/min — ABNORMAL LOW (ref >90)
GFR calc non Af Amer: 34 mL/min — ABNORMAL LOW (ref >90)
Glucose, Bld: 260 mg/dL — ABNORMAL HIGH (ref 70–99)
Potassium: 4.9 mEq/L (ref 3.7–5.3)
Sodium: 135 mEq/L — ABNORMAL LOW (ref 137–147)
TOTAL PROTEIN: 6.7 g/dL (ref 6.0–8.3)
Total Bilirubin: 0.6 mg/dL (ref 0.3–1.2)

## 2013-10-14 LAB — URINALYSIS, ROUTINE W REFLEX MICROSCOPIC
Bilirubin Urine: NEGATIVE
Hgb urine dipstick: NEGATIVE
KETONES UR: 15 mg/dL — AB
NITRITE: NEGATIVE
PH: 6 (ref 5.0–8.0)
Protein, ur: 30 mg/dL — AB
SPECIFIC GRAVITY, URINE: 1.023 (ref 1.005–1.030)
Urobilinogen, UA: 0.2 mg/dL (ref 0.0–1.0)

## 2013-10-14 LAB — I-STAT CG4 LACTIC ACID, ED: Lactic Acid, Venous: 1.82 mmol/L (ref 0.5–2.2)

## 2013-10-14 LAB — CBC WITH DIFFERENTIAL/PLATELET
BASOS ABS: 0 10*3/uL (ref 0.0–0.1)
BASOS PCT: 0 % (ref 0–1)
EOS ABS: 0 10*3/uL (ref 0.0–0.7)
EOS PCT: 0 % (ref 0–5)
HEMATOCRIT: 34.9 % — AB (ref 36.0–46.0)
Hemoglobin: 11.3 g/dL — ABNORMAL LOW (ref 12.0–15.0)
Lymphocytes Relative: 5 % — ABNORMAL LOW (ref 12–46)
Lymphs Abs: 0.7 10*3/uL (ref 0.7–4.0)
MCH: 26.8 pg (ref 26.0–34.0)
MCHC: 32.4 g/dL (ref 30.0–36.0)
MCV: 82.7 fL (ref 78.0–100.0)
MONO ABS: 2.2 10*3/uL — AB (ref 0.1–1.0)
Monocytes Relative: 17 % — ABNORMAL HIGH (ref 3–12)
Neutro Abs: 10 10*3/uL — ABNORMAL HIGH (ref 1.7–7.7)
Neutrophils Relative %: 78 % — ABNORMAL HIGH (ref 43–77)
PLATELETS: 270 10*3/uL (ref 150–400)
RBC: 4.22 MIL/uL (ref 3.87–5.11)
RDW: 15 % (ref 11.5–15.5)
WBC: 12.9 10*3/uL — AB (ref 4.0–10.5)

## 2013-10-14 LAB — GLUCOSE, CAPILLARY
GLUCOSE-CAPILLARY: 230 mg/dL — AB (ref 70–99)
Glucose-Capillary: 203 mg/dL — ABNORMAL HIGH (ref 70–99)

## 2013-10-14 MED ORDER — ENOXAPARIN SODIUM 40 MG/0.4ML ~~LOC~~ SOLN
40.0000 mg | SUBCUTANEOUS | Status: DC
  Administered 2013-10-14 – 2013-10-15 (×2): 40 mg via SUBCUTANEOUS
  Filled 2013-10-14 (×3): qty 0.4

## 2013-10-14 MED ORDER — SACCHAROMYCES BOULARDII 250 MG PO CAPS
250.0000 mg | ORAL_CAPSULE | Freq: Two times a day (BID) | ORAL | Status: DC
  Administered 2013-10-14 – 2013-10-16 (×4): 250 mg via ORAL
  Filled 2013-10-14 (×5): qty 1

## 2013-10-14 MED ORDER — ACETAMINOPHEN 325 MG PO TABS: 650.0000 mg | ORAL_TABLET | Freq: Four times a day (QID) | ORAL | Status: DC | PRN

## 2013-10-14 MED ORDER — SIMVASTATIN 10 MG PO TABS
10.0000 mg | ORAL_TABLET | Freq: Every day | ORAL | Status: DC
  Administered 2013-10-14 – 2013-10-15 (×2): 10 mg via ORAL
  Filled 2013-10-14 (×3): qty 1

## 2013-10-14 MED ORDER — ACETAMINOPHEN 650 MG RE SUPP: 650.0000 mg | Freq: Four times a day (QID) | RECTAL | Status: DC | PRN

## 2013-10-14 MED ORDER — INSULIN ASPART 100 UNIT/ML ~~LOC~~ SOLN
0.0000 [IU] | Freq: Every day | SUBCUTANEOUS | Status: DC
  Administered 2013-10-14: 2 [IU] via SUBCUTANEOUS
  Administered 2013-10-15: 3 [IU] via SUBCUTANEOUS

## 2013-10-14 MED ORDER — ONDANSETRON HCL 4 MG/2ML IJ SOLN: 4.0000 mg | Freq: Four times a day (QID) | INTRAMUSCULAR | Status: DC | PRN

## 2013-10-14 MED ORDER — ONDANSETRON HCL 4 MG PO TABS: 4.0000 mg | ORAL_TABLET | Freq: Four times a day (QID) | ORAL | Status: DC | PRN

## 2013-10-14 MED ORDER — SODIUM CHLORIDE 0.9 % IV SOLN
INTRAVENOUS | Status: DC
  Administered 2013-10-14 – 2013-10-16 (×3): via INTRAVENOUS

## 2013-10-14 MED ORDER — INSULIN ASPART 100 UNIT/ML ~~LOC~~ SOLN
0.0000 [IU] | Freq: Three times a day (TID) | SUBCUTANEOUS | Status: DC
  Administered 2013-10-14: 3 [IU] via SUBCUTANEOUS
  Administered 2013-10-15: 7 [IU] via SUBCUTANEOUS
  Administered 2013-10-15: 3 [IU] via SUBCUTANEOUS
  Administered 2013-10-15: 2 [IU] via SUBCUTANEOUS
  Administered 2013-10-16: 3 [IU] via SUBCUTANEOUS

## 2013-10-14 MED ORDER — DEXTROSE 5 % IV SOLN: 1.0000 g | INTRAVENOUS | Status: DC

## 2013-10-14 MED ORDER — SODIUM CHLORIDE 0.9 % IV BOLUS (SEPSIS)
1000.0000 mL | Freq: Once | INTRAVENOUS | Status: AC
  Administered 2013-10-14: 1000 mL via INTRAVENOUS

## 2013-10-14 MED ORDER — CIPROFLOXACIN IN D5W 400 MG/200ML IV SOLN
400.0000 mg | Freq: Two times a day (BID) | INTRAVENOUS | Status: DC
  Administered 2013-10-14 – 2013-10-16 (×4): 400 mg via INTRAVENOUS
  Filled 2013-10-14 (×5): qty 200

## 2013-10-14 MED ORDER — DEXTROSE 5 % IV SOLN
1.0000 g | Freq: Once | INTRAVENOUS | Status: AC
  Administered 2013-10-14: 1 g via INTRAVENOUS
  Filled 2013-10-14: qty 10

## 2013-10-14 MED ORDER — ONDANSETRON HCL 4 MG/2ML IJ SOLN
4.0000 mg | Freq: Once | INTRAMUSCULAR | Status: AC
  Administered 2013-10-14: 4 mg via INTRAVENOUS
  Filled 2013-10-14: qty 2

## 2013-10-14 MED ORDER — CARVEDILOL 25 MG PO TABS
25.0000 mg | ORAL_TABLET | Freq: Two times a day (BID) | ORAL | Status: DC
  Administered 2013-10-14 – 2013-10-16 (×4): 25 mg via ORAL
  Filled 2013-10-14 (×6): qty 1

## 2013-10-14 MED ORDER — GI COCKTAIL ~~LOC~~
30.0000 mL | Freq: Three times a day (TID) | ORAL | Status: DC | PRN
  Filled 2013-10-14: qty 30

## 2013-10-14 MED ORDER — PANTOPRAZOLE SODIUM 40 MG PO TBEC
40.0000 mg | DELAYED_RELEASE_TABLET | Freq: Two times a day (BID) | ORAL | Status: DC
  Administered 2013-10-14 – 2013-10-16 (×4): 40 mg via ORAL
  Filled 2013-10-14 (×4): qty 1

## 2013-10-14 NOTE — ED Notes (Signed)
Pt c/o right sided flank pain x 2 weeks, went to PCP last Wednesday and was dx with a UTI and put on doxycyline, pt sts she has taken a few doses but doesn't feel any better yet. sts that she went to the doctor because the pain had increased and she noticed urine in her blood. Pt sts the flank pain and dysuria is intermittent. sts she had a fever at home of 100.1 earlier, denies taking tylenol today, but sts she had taken 1 tylenol a day for the past couple of days for pain. Hx of urosepsis 2 years ago. Nad, skin warm and dry, resp e/u.

## 2013-10-14 NOTE — H&P (Addendum)
[facility] History and Physical  Ann Hunter TDV:[id number] DOB: [birth year] DOA: 10/14/2013  Referring physician:  Blanchie Dessert PCP:  Lillard Anes, MD   Chief Complaint:  Flank pain  HPI:  The patient is a 68 y.o. 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  The patient was last at their baseline health 5 days ago.  She developed hematuria with 8/10 lower abdominal pain which was constant and crampy and was seen the same day by her PCP.  In clinic her urine dipstick was positive and she was started on doxycycline which she started taking immediately.  Despite her antibiotic, she developed nausea, fevers to 101.27F (this AM) and chills, worsening right flank pain with radiation to the right side and RLQ, bladder pain.  Her hematuria resolved.  At baseline, she ambulates without difficulty but she has become progressively weak and was not able to walk without assistance today so her husband convinced her to come to the ER.  Today, she has had two episodes of watery diarrhea.  She continues to have intermittent urgency/frequency and dysuria. Two years ago, she was hospitalized at [facility] with septic shock secondary to pyelonephritis and almost died.  In the emergency department, her vital signs were notable for mildly low blood pressure of 109/48, temperature 99, other vital signs stable. Labs notable for white blood cell count of 12.9, hemoglobin 11.3, sodium 135, BUN 32, creatinine 1.51, glucose 260. Lactic acid 1.82.  Assistant is to count WBCs, moderate leukocyte esterase, few bacteria, greater than 1000 glucose, 15 ketones.  CT scan demonstrated bilateral nonobstructing renal calculi up to 12 mm on the left and 2 mm on the right without hydronephrosis. Chest had a 2.5 cm mildly hyperdense anterior interpolar left renal cysts that  may have some solid component. Radiologist is recommending followup ultrasound.  Review of Systems:  General:  + fevers, chills, denies weight loss or gain HEENT:  Denies changes to hearing and vision, rhinorrhea, sinus congestion, sore throat CV:  Denies chest pain and palpitations, lower extremity edema.  PULM:  Denies SOB, wheezing, cough.   GI:  Per HPI   GU:  Per HPI ENDO:  Denies polyuria, polydipsia.   HEME:  Denies hematemesis, blood in stools, melena, abnormal bruising or bleeding.  LYMPH:  Denies lymphadenopathy.   MSK:  Denies arthralgias, myalgias.   DERM:  Denies skin rash or ulcer.   NEURO:  Denies focal numbness, weakness, slurred speech, confusion, facial droop.  PSYCH:  Denies anxiety and depression.    Past Medical History  Diagnosis Date  . Osteoporosis, unspecified   . Pure hypercholesterolemia   . Essential hypertension, benign   . Unspecified vitamin D deficiency   . Head injury, unspecified   . Type II or unspecified type diabetes mellitus without mention of complication, uncontrolled   . Paroxysmal atrial fibrillation     during septic shock, none since  . B12 deficiency   . Closed head injury   . Memory difficulty     d/t head injury   Past Surgical History  Procedure Laterality Date  . Appendectomy  1981  . Tonsillectomy    . Total abdominal hysterectomy  1981  . Cardiac catheterization     Social History:  reports that she has never smoked. She does not have any smokeless tobacco history on file. She reports that she does not drink alcohol or use illicit drugs.  Lives with husband, no assist devices.   Allergies  Allergen Reactions  . Codeine Shortness Of Breath  . Ace Inhibitors   . Advil [Ibuprofen]   . Aleve [Naproxen Sodium]   . Boniva [Ibandronic Acid]     Swelling   . Clonidine Derivatives   . Januvia [Sitagliptin Phosphate]   . Penicillins     Swelling   . Sulfa Antibiotics     Throat swells    Family History  Problem  Relation Age of Onset  . Coronary artery disease Mother     MI  . Heart disease Father     CHF  . Diabetes Sister   . Thyroid disease Sister   . Arthritis Sister   . Diabetes Sister   . Diabetes Sister      Prior to Admission medications   Medication Sig Start Date End Date Taking? Authorizing Provider  Canagliflozin (INVOKANA) 100 MG TABS Take 100 mg by mouth daily.   Yes Historical Provider, MD  carvedilol (COREG) 25 MG tablet Take 25 mg by mouth 2 (two) times daily with a meal.   Yes Historical Provider, MD  doxycycline (VIBRAMYCIN) 100 MG capsule Take 100 mg by mouth 2 (two) times daily. For 10 days. Started 10/10/2013   Yes Historical Provider, MD  glimepiride (AMARYL) 4 MG tablet Take 4 mg by mouth 2 (two) times daily.    Yes Historical Provider, MD  hydrALAZINE (APRESOLINE) 25 MG tablet Take 1 tablet by mouth Twice daily. 01/18/12  Yes Historical Provider, MD  losartan (COZAAR) 100 MG tablet Take 100 mg by mouth daily.     Yes Historical Provider, MD  magnesium oxide (MAG-OX) 400 MG tablet Take 400 mg by mouth daily.   Yes Historical Provider, MD  metFORMIN (GLUCOPHAGE) 1000 MG tablet Take 1,000 mg by mouth 2 (two) times daily with a meal.     Yes Historical Provider, MD  omeprazole (PRILOSEC) 20 MG capsule Take 20 mg by mouth daily.   Yes Historical Provider, MD  pravastatin (PRAVACHOL) 20 MG tablet Take 1 tablet by mouth Daily. 11/29/11  Yes Historical Provider, MD   Physical Exam: Filed Vitals:   10/14/13 1515 10/14/13 1614 10/14/13 1615 10/14/13 1645  BP: 109/48 118/53 107/57   Pulse: 81 80 80 83  Temp:      TempSrc:      Resp: 20 17 12 17   SpO2: 95% 93%  90%     General:  CF, NAD  Eyes:  PERRL, anicteric, non-injected.  ENT:  Nares clear.  OP clear, non-erythematous without plaques or exudates.  MMM.  Neck:  Supple without TM or JVD.    Lymph:  No cervical, supraclavicular, or submandibular LAD.  Cardiovascular:  RRR, normal S1, S2, without m/r/g.  2+ pulses,  warm extremities  Respiratory:  CTA bilaterally without increased WOB.  Abdomen:  NABS.  Soft, nondistended, currently nontender, + right flank TTP.  Skin:  No rashes or focal lesions.  Musculoskeletal:  Normal bulk and tone.  No LE edema.  Psychiatric:  A & O x 4.  Appropriate affect although poor insight into medical conditions  Neurologic:  CN 3-12 intact.  5/5 strength.  Sensation intact.  Labs on Admission:  Basic Metabolic Panel:  Recent Labs Lab 10/14/13 1410  NA 135*  K 4.9  CL 95*  CO2 21  GLUCOSE 260*  BUN 32*  CREATININE 1.51*  CALCIUM 9.6   Liver Function Tests:  Recent Labs Lab 10/14/13 1410  AST 16  ALT 9  ALKPHOS 63  BILITOT 0.6  PROT 6.7  ALBUMIN 3.3*   No results found for this basename: LIPASE, AMYLASE,  in the last 168 hours No results found for this basename: AMMONIA,  in the last 168 hours CBC:  Recent Labs Lab 10/14/13 1410  WBC 12.9*  NEUTROABS 10.0*  HGB 11.3*  HCT 34.9*  MCV 82.7  PLT 270   Cardiac Enzymes: No results found for this basename: CKTOTAL, CKMB, CKMBINDEX, TROPONINI,  in the last 168 hours  BNP (last 3 results) No results found for this basename: PROBNP,  in the last 8760 hours CBG: No results found for this basename: GLUCAP,  in the last 168 hours  Radiological Exams on Admission: Ct Abdomen Pelvis Wo Contrast  10/14/2013   CLINICAL DATA:  Right flank pain  EXAM: CT ABDOMEN AND PELVIS WITHOUT CONTRAST  TECHNIQUE: Multidetector CT imaging of the abdomen and pelvis was performed following the standard protocol without IV contrast.  COMPARISON:  None.  FINDINGS: Lung bases are essentially clear.  Liver is notable for a 14 mm probable cyst in the posterior segment right hepatic lobe (series 201/image 27).  Spleen, pancreas, adrenal glands are within normal limits.  Gallbladder is unremarkable. No intrahepatic or extrahepatic ductal dilatation.  2 mm nonobstructing right lower pole renal calculus (series 201/ image 46).  12 mm nonobstructing left lower pole renal calculus (series 201/image 40). 2.5 cm mildly hyperdense anterior interpolar probable left renal cyst (series 201/ image 38), although technically indeterminate. No hydronephrosis.  No evidence of bowel obstruction. Prior appendectomy. Colonic diverticulosis, without associated inflammatory changes.  Atherosclerotic calcifications of the abdominal aorta and branch vessels.  No abdominopelvic ascites.  No suspicious abdominopelvic lymphadenopathy.  No ureteral or bladder calculi.  Bladder is within normal limits.  Mild degenerative changes of the lower thoracic spine.  IMPRESSION: Bilateral nonobstructing renal calculi, measuring 12 mm on the left and 2 mm on the right. No hydronephrosis.  2.5 cm mildly hyperdense anterior interpolar probable left renal cyst, technically indeterminate. Given size, consider renal ultrasound for initial evaluation to assess for cystic versus solid mass.   Electronically Signed   By: Julian Hy M.D.   On: 10/14/2013 16:08    EKG: pending  Assessment/Plan Active Problems:   Pyelonephritis   Acute pyelonephritis  ---  Acute pyelonephritis, already received ceftriaxone but blood cultures not obtained prior to antibiotics.  Given lack of response to doxy, may have MDR organism.   -  Ciprofloxacin  -  If not improving, escalate to aztreonam -  Start florastor -  Followup urine culture -  Blood cultures x 2 -  Hold invokana in part b/c of increased risk of UTI due to increased glucose excretion in urine  Acute vs. Chronic kidney disease, likely AKI from dehydration  -  IVF -  Hold ARB -  Repeat in AM  Nephroliths without obstruction, monitor clinically for sx of passing kidney stone  Left renal cyst with possible solid component -  Recommend MRI after infection treated  PAF, occurred during septic shock a few years ago and has not recurred.  Currently sinus rhythm.  Followed by cardiology who recommended against  long-term anticoagulation since a-fib was felt to be caused by acute severe illness.  HTN/HLD, blood pressures low normal -  Hold ARB and hydralazine -  Continue carvedilol  T2DM, stable -  Recent A1c from PCP a few weeks ago and was told "very well controlled" -  Hold metformin and glimeperide  due to kidney function -  Hold invokana due to UTI -  Start sliding scale insulin  GERD, uncontrolled but some nausea may be secondary to pyelonephritis or from doxy. -  Start BID protonix -  GI cocktail  Watery diarrhea -  C. Diff  -  Enteric precautions  Leukocytosis and mild normocytic anemia due to pyelo, repeat CBC in AM  Hyponatremia may be due to dehydration  -  IVF and repeat Na in AM  Diet:  diabetic Access:  PIV IVF:  yes Proph:  lovenox  Code Status: full Family Communication: patient and her husband Disposition Plan: Admit to med-surg  Time spent: 60 min Janece Canterbury [facility] Pager [phone]  If 7PM-7AM, please contact night-coverage [email or web address] Password [facility] 10/14/2013, 5:37 PM

## 2013-10-14 NOTE — Progress Notes (Signed)
RECEIVED PT TO ROOM [id number] FROM ED, DENIES NAUSEA/PAIN AT THIS TIME, INSTRUCTED ON USAGE OF CALL LIGHT AND ROOM SURROUNDINGS, SON AT BEDSIDE.

## 2013-10-14 NOTE — ED Provider Notes (Signed)
CSN: [id number]     Arrival date & time 10/14/13  1329 History   First MD Initiated Contact with Patient 10/14/13 1359     Chief Complaint  Patient presents with  . Flank Pain     (Consider location/radiation/quality/duration/timing/severity/associated sxs/prior Treatment) HPI Comments: Pt with painless hematuria and flank pain on Wednesday of this week and saw PCP and dx with UTI. Pt started on doxycycline and has been taking that for 5 days however no improvement in sx.  Hematuria stopped but now for the last 2 days pt is having fever and chills, nausea without vomiting, right flank and suprapubic pain and decreased appetite  Patient is a 68 y.o. female presenting with flank pain. The history is provided by the patient and the spouse.  Flank Pain This is a new problem. Episode onset: 5 days ago. The problem occurs constantly. The problem has not changed since onset.Associated symptoms include abdominal pain. Pertinent negatives include no chest pain and no shortness of breath. Associated symptoms comments: Fever, hematuria, nausea and decreased appetite. Nothing aggravates the symptoms. Nothing relieves the symptoms. She has tried nothing for the symptoms.    Past Medical History  Diagnosis Date  . Osteoporosis, unspecified   . Pure hypercholesterolemia   . Essential hypertension, benign   . Unspecified vitamin D deficiency   . Head injury, unspecified   . Type II or unspecified type diabetes mellitus without mention of complication, uncontrolled   . Atrial fibrillation   . B12 deficiency   . Closed head injury   . Memory difficulty     d/t head injury   Past Surgical History  Procedure Laterality Date  . Appendectomy  1981  . Tonsillectomy    . Total abdominal hysterectomy     Family History  Problem Relation Age of Onset  . Coronary artery disease Mother   . Heart disease Father     CHF  . Diabetes Sister   . Thyroid disease Sister   . Arthritis Sister   . Diabetes  Sister   . Diabetes Sister    History  Substance Use Topics  . Smoking status: Never Smoker   . Smokeless tobacco: Not on file  . Alcohol Use: No   OB History   Grav Para Term Preterm Abortions TAB SAB Ect Mult Living                 Review of Systems  Constitutional: Positive for fever, chills and appetite change.  Respiratory: Negative for cough and shortness of breath.   Cardiovascular: Negative for chest pain.  Gastrointestinal: Positive for nausea and abdominal pain. Negative for vomiting and diarrhea.  Genitourinary: Positive for frequency and flank pain. Negative for dysuria.  All other systems reviewed and are negative.     Allergies  Codeine; Ace inhibitors; Advil; Aleve; Boniva; Clonidine derivatives; Januvia; and Penicillins  Home Medications   Prior to Admission medications   Medication Sig Start Date End Date Taking? Authorizing Provider  carvedilol (COREG) 25 MG tablet Take 25 mg by mouth 2 (two) times daily with a meal.    Historical Provider, MD  glimepiride (AMARYL) 4 MG tablet Take 4 mg by mouth 2 (two) times daily.     Historical Provider, MD  hydrALAZINE (APRESOLINE) 25 MG tablet Take 1 tablet by mouth Twice daily. 01/18/12   Historical Provider, MD  losartan (COZAAR) 100 MG tablet Take 100 mg by mouth daily.      Historical Provider, MD  magnesium oxide (MAG-OX) 400  MG tablet Take 400 mg by mouth daily.    Historical Provider, MD  metFORMIN (GLUCOPHAGE) 1000 MG tablet Take 1,000 mg by mouth 2 (two) times daily with a meal.      Historical Provider, MD  pravastatin (PRAVACHOL) 20 MG tablet Take 1 tablet by mouth Daily. 11/29/11   Historical Provider, MD  vitamin B-12 (CYANOCOBALAMIN) 1000 MCG tablet Take 1,000 mcg by mouth daily.     Historical Provider, MD   BP 105/47  Pulse 88  Temp(Src) 99 F (37.2 C) (Oral)  Resp 17  SpO2 92% Physical Exam  Nursing note and vitals reviewed. Constitutional: She is oriented to person, place, and time. She appears  well-developed and well-nourished. No distress.  HENT:  Head: Normocephalic and atraumatic.  Mouth/Throat: Oropharynx is clear and moist.  Eyes: Conjunctivae and EOM are normal. Pupils are equal, round, and reactive to light.  Neck: Normal range of motion. Neck supple.  Cardiovascular: Normal rate, regular rhythm and intact distal pulses.   No murmur heard. Pulmonary/Chest: Effort normal and breath sounds normal. No respiratory distress. She has no wheezes. She has no rales.  Abdominal: Soft. She exhibits no distension. There is no tenderness. There is CVA tenderness. There is no rebound and no guarding.  Right flank tenderness  Musculoskeletal: Normal range of motion. She exhibits no edema and no tenderness.  Neurological: She is alert and oriented to person, place, and time.  Skin: Skin is warm and dry. No rash noted. No erythema.  Psychiatric: She has a normal mood and affect. Her behavior is normal.    ED Course  Procedures (including critical care time) Labs Review Labs Reviewed  CBC WITH DIFFERENTIAL - Abnormal; Notable for the following:    WBC 12.9 (*)    Hemoglobin 11.3 (*)    HCT 34.9 (*)    Neutrophils Relative % 78 (*)    Neutro Abs 10.0 (*)    Lymphocytes Relative 5 (*)    Monocytes Relative 17 (*)    Monocytes Absolute 2.2 (*)    All other components within normal limits  COMPREHENSIVE METABOLIC PANEL - Abnormal; Notable for the following:    Sodium 135 (*)    Chloride 95 (*)    Glucose, Bld 260 (*)    BUN 32 (*)    Creatinine, Ser 1.51 (*)    Albumin 3.3 (*)    GFR calc non Af Amer 34 (*)    GFR calc Af Amer 40 (*)    Anion gap 19 (*)    All other components within normal limits  URINALYSIS, ROUTINE W REFLEX MICROSCOPIC - Abnormal; Notable for the following:    APPearance CLOUDY (*)    Glucose, UA >1000 (*)    Ketones, ur 15 (*)    Protein, ur 30 (*)    Leukocytes, UA MODERATE (*)    All other components within normal limits  URINE MICROSCOPIC-ADD ON -  Abnormal; Notable for the following:    Bacteria, UA FEW (*)    All other components within normal limits  URINE CULTURE  I-STAT CG4 LACTIC ACID, ED    Imaging Review Ct Abdomen Pelvis Wo Contrast  10/14/2013   CLINICAL DATA:  Right flank pain  EXAM: CT ABDOMEN AND PELVIS WITHOUT CONTRAST  TECHNIQUE: Multidetector CT imaging of the abdomen and pelvis was performed following the standard protocol without IV contrast.  COMPARISON:  None.  FINDINGS: Lung bases are essentially clear.  Liver is notable for a 14 mm probable cyst in  the posterior segment right hepatic lobe (series 201/image 27).  Spleen, pancreas, adrenal glands are within normal limits.  Gallbladder is unremarkable. No intrahepatic or extrahepatic ductal dilatation.  2 mm nonobstructing right lower pole renal calculus (series 201/ image 46). 12 mm nonobstructing left lower pole renal calculus (series 201/image 40). 2.5 cm mildly hyperdense anterior interpolar probable left renal cyst (series 201/ image 38), although technically indeterminate. No hydronephrosis.  No evidence of bowel obstruction. Prior appendectomy. Colonic diverticulosis, without associated inflammatory changes.  Atherosclerotic calcifications of the abdominal aorta and branch vessels.  No abdominopelvic ascites.  No suspicious abdominopelvic lymphadenopathy.  No ureteral or bladder calculi.  Bladder is within normal limits.  Mild degenerative changes of the lower thoracic spine.  IMPRESSION: Bilateral nonobstructing renal calculi, measuring 12 mm on the left and 2 mm on the right. No hydronephrosis.  2.5 cm mildly hyperdense anterior interpolar probable left renal cyst, technically indeterminate. Given size, consider renal ultrasound for initial evaluation to assess for cystic versus solid mass.   Electronically Signed   By: Julian Hy M.D.   On: 10/14/2013 16:08     EKG Interpretation None      MDM   Final diagnoses:  Pyelonephritis    Patient diagnosed with  a urinary tract infection on Wednesday and started on doxycycline. Since that time she's had progressive symptoms of fever, nausea and right flank pain.  Initially patient had blood in her urine which has improved her other symptoms are getting worse. She does have a significant history for urosepsis several years ago but does not get frequent urinary tract infections. She is hemodynamically stable on exam with right flank tenderness. No prior history of kidney stones.  Concern for ongoing UTI with pyelonephritis versus infected stone. No symptoms to suggest appendicitis, diverticulitis, pancreatitis or cholecystitis.  CBC, CMP, UA, lactate, CT without contrast of the abdomen and pelvis pending for further evaluation. Patient given IV fluids and nausea medication.  3:34 PM Leukocytosis and normal lactate.  Pt started on rocephin and will admit for pyelo.  CT neg for obstructing stones.  Will admit for further care.  Blanchie Dessert, MD 10/14/13 [phone]

## 2013-10-14 NOTE — ED Notes (Signed)
Pt. Stated,  I've had rt. Back pain.  i waas here on Wednesday with the same symptoms.

## 2013-10-14 NOTE — ED Notes (Signed)
Pt's bed switch back to [facility] not [facility].

## 2013-10-14 NOTE — ED Notes (Signed)
Attempted report to 5W. 

## 2013-10-15 DIAGNOSIS — R6510 Systemic inflammatory response syndrome (SIRS) of non-infectious origin without acute organ dysfunction: Secondary | ICD-10-CM

## 2013-10-15 DIAGNOSIS — E1139 Type 2 diabetes mellitus with other diabetic ophthalmic complication: Secondary | ICD-10-CM

## 2013-10-15 DIAGNOSIS — E11319 Type 2 diabetes mellitus with unspecified diabetic retinopathy without macular edema: Secondary | ICD-10-CM

## 2013-10-15 LAB — BASIC METABOLIC PANEL
ANION GAP: 13 (ref 5–15)
BUN: 29 mg/dL — ABNORMAL HIGH (ref 6–23)
CALCIUM: 9.2 mg/dL (ref 8.4–10.5)
CO2: 24 mEq/L (ref 19–32)
Chloride: 98 mEq/L (ref 96–112)
Creatinine, Ser: 1.59 mg/dL — ABNORMAL HIGH (ref 0.50–1.10)
GFR, EST AFRICAN AMERICAN: 37 mL/min — AB (ref >90)
GFR, EST NON AFRICAN AMERICAN: 32 mL/min — AB (ref >90)
GLUCOSE: 145 mg/dL — AB (ref 70–99)
POTASSIUM: 4 meq/L (ref 3.7–5.3)
SODIUM: 135 meq/L — AB (ref 137–147)

## 2013-10-15 LAB — CBC
HCT: 29.4 % — ABNORMAL LOW (ref 36.0–46.0)
Hemoglobin: 9.3 g/dL — ABNORMAL LOW (ref 12.0–15.0)
MCH: 26.5 pg (ref 26.0–34.0)
MCHC: 31.6 g/dL (ref 30.0–36.0)
MCV: 83.8 fL (ref 78.0–100.0)
PLATELETS: 241 10*3/uL (ref 150–400)
RBC: 3.51 MIL/uL — AB (ref 3.87–5.11)
RDW: 15.3 % (ref 11.5–15.5)
WBC: 9.4 10*3/uL (ref 4.0–10.5)

## 2013-10-15 LAB — GLUCOSE, CAPILLARY
Glucose-Capillary: 185 mg/dL — ABNORMAL HIGH (ref 70–99)
Glucose-Capillary: 327 mg/dL — ABNORMAL HIGH (ref 70–99)
Glucose-Capillary: 225 mg/dL — ABNORMAL HIGH (ref 70–99)
Glucose-Capillary: 297 mg/dL — ABNORMAL HIGH (ref 70–99)

## 2013-10-15 NOTE — Progress Notes (Signed)
Utilization review completed.  

## 2013-10-15 NOTE — Progress Notes (Signed)
PROGRESS NOTE  Ann Hunter FBP:[id number] DOB: 07-Aug-1945 DOA: 10/14/2013 PCP: Lillard Anes, MD  HPI/Subjective: Ann Hunter, 68 year old female, with past medical history significant for HTN, hyperlipidemia, DM type 2, AFib presented to the ED with right sided flank pain. Patient had hematuria and was diagnosed with a UTI 5 days prior by her PCP and was started on Doxycycline. Patient was not getting better and had two bouts of diarrhea yesterday so came to the ED. Two years ago, she was hospitalized at [facility] with septic shock secondary to pyelonephritis and almost died.  Today, patient states she is feeling much better. Still has right sided flank pain, but pain is improved. Denies nausea, vomiting, diarrhea, abdominal pain, chest pain, dyspnea.   Assessment/Plan: Principle Problem Acute Pyelonephritis -UA positive for UTI, has right sided CVA tenderness, admitted and started on Cipro on 7/5. Was on Doxycycline as outpatient -clinically improved, afebrile, leukocytosis decreasing. Continue, await Urine/Blood Cultures  Active Problems SIR's -secondary to above -better with IVF and IV Cipro  Acute Renal Failure -increase IVF to 125 cc/hr -continue to hold ARB -etiology likely secondary to ARB/Dehydration  Diarrhea  -resolved, no need to rule out C Diff  as diarrhea has completely resolved -c/w Florastor  HTN -Hold ARB and hydralazine -BP currently controlled with carvedilol  Type 2 DM -CBG's moderately controlled, c/w SSI while inpatient. Hold all oral hypoglycemic agents for now  GERD -c/w  BID protonix  -GI cocktail prn  Hyponatremia -Most likely due to low po intake and dehydration -Given IVF - continue to monitor   Nephroliths and Renal Cyst  -CT shows bilateral renal calculus and renal cyst  With possible solid component -Recommend MRI after infection treated  DVT Prophylaxis:  Lovenox   Code Status: Full Family Communication: Patient is alert  and oriented, a lot of family members at bedside Disposition Plan: Home when medically appropriate    Consultants:  None  Procedures:  None  Antibiotics: Antibiotics Given (last 72 hours)   Date/Time Action Medication Dose Rate   10/14/13 2123 Given   ciprofloxacin (CIPRO) IVPB 400 mg 400 mg 200 mL/hr   10/15/13 0843 Given   ciprofloxacin (CIPRO) IVPB 400 mg 400 mg 200 mL/hr      Objective: Filed Vitals:   10/14/13 1615 10/14/13 1645 10/14/13 1801 10/14/13 2032  BP: 107/57  105/60 136/74  Pulse: 80 83 77 84  Temp:   98.4 F (36.9 C) 99 F (37.2 C)  TempSrc:   Oral Oral  Resp: 12 17 20 20   Height:   5' 4.5" (1.638 m)   Weight:   68.448 kg (150 lb 14.4 oz)   SpO2:  90% 94% 93%   No intake or output data in the 24 hours ending 10/15/13 0840 Filed Weights   10/14/13 1801  Weight: 68.448 kg (150 lb 14.4 oz)    Exam: General: Well developed, well nourished, NAD, appears stated age  79:  Anicteic Sclera, MMM. No pharyngeal erythema or exudates  Neck: Supple, no masses  Cardiovascular:S1 S2 auscultated, no rubs, murmurs or gallops.   Respiratory: Clear to auscultation bilaterally with equal chest rise  Abdomen: Soft, nontender, nondistended, + bowel sounds, + Right CVA tenderness  Extremities: warm dry without cyanosis or edema.  Skin: Without rashes exudates or nodules.   Psych: Normal affect and demeanor with intact judgement and insight   Data Reviewed: Basic Metabolic Panel:  Recent Labs Lab 10/14/13 1410 10/15/13 0450  NA 135* 135*  K 4.9 4.0  CL 95* 98  CO2 21 24  GLUCOSE 260* 145*  BUN 32* 29*  CREATININE 1.51* 1.59*  CALCIUM 9.6 9.2   Liver Function Tests:  Recent Labs Lab 10/14/13 1410  AST 16  ALT 9  ALKPHOS 63  BILITOT 0.6  PROT 6.7  ALBUMIN 3.3*   No results found for this basename: LIPASE, AMYLASE,  in the last 168 hours No results found for this basename: AMMONIA,  in the last 168 hours CBC:  Recent Labs Lab  10/14/13 1410 10/15/13 0450  WBC 12.9* 9.4  NEUTROABS 10.0*  --   HGB 11.3* 9.3*  HCT 34.9* 29.4*  MCV 82.7 83.8  PLT 270 241   Cardiac Enzymes: No results found for this basename: CKTOTAL, CKMB, CKMBINDEX, TROPONINI,  in the last 168 hours BNP (last 3 results) No results found for this basename: PROBNP,  in the last 8760 hours CBG:  Recent Labs Lab 10/14/13 1809 10/14/13 2201 10/15/13 0800  GLUCAP 203* 230* 185*    No results found for this or any previous visit (from the past 240 hour(s)).   Studies: Ct Abdomen Pelvis Wo Contrast  10/14/2013   CLINICAL DATA:  Right flank pain  EXAM: CT ABDOMEN AND PELVIS WITHOUT CONTRAST  TECHNIQUE: Multidetector CT imaging of the abdomen and pelvis was performed following the standard protocol without IV contrast.  COMPARISON:  None.  FINDINGS: Lung bases are essentially clear.  Liver is notable for a 14 mm probable cyst in the posterior segment right hepatic lobe (series 201/image 27).  Spleen, pancreas, adrenal glands are within normal limits.  Gallbladder is unremarkable. No intrahepatic or extrahepatic ductal dilatation.  2 mm nonobstructing right lower pole renal calculus (series 201/ image 46). 12 mm nonobstructing left lower pole renal calculus (series 201/image 40). 2.5 cm mildly hyperdense anterior interpolar probable left renal cyst (series 201/ image 38), although technically indeterminate. No hydronephrosis.  No evidence of bowel obstruction. Prior appendectomy. Colonic diverticulosis, without associated inflammatory changes.  Atherosclerotic calcifications of the abdominal aorta and branch vessels.  No abdominopelvic ascites.  No suspicious abdominopelvic lymphadenopathy.  No ureteral or bladder calculi.  Bladder is within normal limits.  Mild degenerative changes of the lower thoracic spine.  IMPRESSION: Bilateral nonobstructing renal calculi, measuring 12 mm on the left and 2 mm on the right. No hydronephrosis.  2.5 cm mildly hyperdense  anterior interpolar probable left renal cyst, technically indeterminate. Given size, consider renal ultrasound for initial evaluation to assess for cystic versus solid mass.   Electronically Signed   By: Julian Hy M.D.   On: 10/14/2013 16:08    Scheduled Meds: . carvedilol  25 mg Oral BID WC  . ciprofloxacin  400 mg Intravenous Q12H  . enoxaparin (LOVENOX) injection  40 mg Subcutaneous Q24H  . insulin aspart  0-5 Units Subcutaneous QHS  . insulin aspart  0-9 Units Subcutaneous TID WC  . pantoprazole  40 mg Oral BID AC  . saccharomyces boulardii  250 mg Oral BID  . simvastatin  10 mg Oral q1800   Continuous Infusions: . sodium chloride 75 mL/hr at 10/14/13 1827    Active Problems:   Pyelonephritis   Acute pyelonephritis   Ferne Reus, PA-S   [facility] Pager [phone]. If 7PM-7AM, please contact night-coverage at [email or web address], password [facility] 10/15/2013, 8:40 AM  LOS: 1 day   Attending Patient was seen, examined,treatment plan was discussed with the Physician extender. I have directly reviewed the clinical findings, lab, imaging studies and management of this patient  in detail. I have made the necessary changes to the above noted documentation, and agree with the documentation, as recorded by the Physician extender.  Nena Alexander MD [facility].

## 2013-10-15 NOTE — Progress Notes (Signed)
Inpatient Diabetes Program Recommendations  AACE/ADA: New Consensus Statement on Inpatient Glycemic Control (2013)  Target Ranges:  Prepandial:   less than 140 mg/dL      Peak postprandial:   less than 180 mg/dL (1-2 hours)      Critically ill patients:  140 - 180 mg/dL   Reason for Assessment:  Results for Ann Hunter, Ann Hunter (MRN [id number]) as of 10/15/2013 13:37  Ref. Range 10/14/2013 18:09 10/14/2013 22:01 10/15/2013 08:00 10/15/2013 11:58  Glucose-Capillary Latest Range: 70-99 mg/dL 203 (H) 230 (H) 185 (H) 327 (H)   Diabetes history: Diabetes Outpatient Diabetes medications: Invokana 100 mg daily, Amaryl 4 mg bid, Metformin 1000 mg bid Current orders for Inpatient glycemic control:  Novolog sensitive tid with meals  Note CBG's elevated.  Oral medications currently on hold in the hospital.   Consider adding basal/bolus in the hospital while oral medications are on hold.  May consider Levemir 10 units daily and Novolog meal coverage 3 units tid with meals.    Thanks, Adah Perl, RN, BC-ADM Inpatient Diabetes Coordinator Pager [phone]

## 2013-10-16 DIAGNOSIS — K219 Gastro-esophageal reflux disease without esophagitis: Secondary | ICD-10-CM | POA: Diagnosis present

## 2013-10-16 DIAGNOSIS — E871 Hypo-osmolality and hyponatremia: Secondary | ICD-10-CM | POA: Diagnosis present

## 2013-10-16 DIAGNOSIS — N179 Acute kidney failure, unspecified: Secondary | ICD-10-CM | POA: Diagnosis present

## 2013-10-16 DIAGNOSIS — R6510 Systemic inflammatory response syndrome (SIRS) of non-infectious origin without acute organ dysfunction: Secondary | ICD-10-CM | POA: Diagnosis present

## 2013-10-16 LAB — BASIC METABOLIC PANEL
Anion gap: 14 (ref 5–15)
BUN: 19 mg/dL (ref 6–23)
CO2: 23 mEq/L (ref 19–32)
CREATININE: 1.34 mg/dL — AB (ref 0.50–1.10)
Calcium: 9.5 mg/dL (ref 8.4–10.5)
Chloride: 104 mEq/L (ref 96–112)
GFR calc Af Amer: 46 mL/min — ABNORMAL LOW (ref >90)
GFR, EST NON AFRICAN AMERICAN: 39 mL/min — AB (ref >90)
GLUCOSE: 219 mg/dL — AB (ref 70–99)
POTASSIUM: 4.8 meq/L (ref 3.7–5.3)
Sodium: 141 mEq/L (ref 137–147)

## 2013-10-16 LAB — URINE CULTURE

## 2013-10-16 LAB — GLUCOSE, CAPILLARY: GLUCOSE-CAPILLARY: 206 mg/dL — AB (ref 70–99)

## 2013-10-16 MED ORDER — SACCHAROMYCES BOULARDII 250 MG PO CAPS: 250.0000 mg | ORAL_CAPSULE | Freq: Two times a day (BID) | ORAL | Status: DC

## 2013-10-16 MED ORDER — CIPROFLOXACIN HCL 500 MG PO TABS: 500.0000 mg | ORAL_TABLET | Freq: Two times a day (BID) | ORAL | Status: DC

## 2013-10-16 NOTE — Plan of Care (Signed)
Problem: Phase III Progression Outcomes Goal: Foley discontinued Outcome: Not Applicable Date Met:  02/04/47 Pt voids.

## 2013-10-16 NOTE — Progress Notes (Signed)
Patient discharge teaching given, including activity, diet, follow-up appoints, and medications. Patient verbalized understanding of all discharge instructions. IV access was d/c'd. Vitals are stable. Skin is intact except as charted in most recent assessments. Pt to be escorted out by NT, to be driven home by family. 

## 2013-10-16 NOTE — Discharge Summary (Signed)
Physician Discharge Summary  Ann Hunter UQJ:[id number] DOB: 13-Apr-1945 DOA: 10/14/2013  PCP: Lillard Anes, MD  Admit date: 10/14/2013 Discharge date: 10/16/2013  Time spent: 60 minutes  Recommendations for Outpatient Follow-up:  1. Please check BMET in 1 week 2. Blood cultures negative so far, please follow till final 3. CT Abd-shows a 2.5 cm hyperdense left renal cyst, please arrangefor outpatient renal ultrasound or MRI kidneys   Discharge Diagnoses:  Active Problems:   HTN (hypertension)   DM (diabetes mellitus)   Pyelonephritis   Hyponatremia   GERD (gastroesophageal reflux disease)   SIRS (systemic inflammatory response syndrome)   Acute renal failure   Discharge Condition: Stable  Diet recommendation: Heart Healthy   Filed Weights   10/14/13 1801  Weight: 68.448 kg (150 lb 14.4 oz)    History of present illness:  Ann Hunter, 68 year old female, with past medical history significant for HTN, hyperlipidemia, DM type 2, AFib presented to the ED with right sided flank pain. Patient had hematuria and was diagnosed with a UTI 5 days prior by her PCP and was started on Doxycycline. Patient was not getting better and had two bouts of diarrhea yesterday so came to the ED. Two years ago, she was hospitalized at [facility] with septic shock secondary to pyelonephritis and almost died.   Today, patient states she is feeling much better. Denies flank pain. Denies nausea, vomiting, diarrhea, abdominal pain, chest pain, dyspnea. Patient is eager to walk and go home.    Hospital Course:  Principle Problem  Acute Pyelonephritis  - UA positive for UTI, had right sided CVA tenderness, admitted and started on Cipro on 7/5. Was on Doxycycline as outpatient  - clinically improved no further right sided CVA tenderness at day of discharge, afebrile, leukocytosis resolved - Switched to po antibiotics on 7/7, will continue with Cipro for 10 more days on discharge - blood cultures no  growth to date at time of discharge - urine cultures positive for E. Coli -sensitive to Cipro   Active Problems  SIR's  -secondary to above  -With IVF and IV Cipro - clinically improved, SIR's has resolved -Continue with po Cipro outpatient  Acute Renal Failure  -etiology likely secondary to ARB/Dehydration  -Started IVF  -Held ARB at admission, will resume on discharge. Creatinine at discharge decreased to 1.34, suspect may have underlying stage 2 CKD.  Diarrhea  -resolved, no need to rule out C Diff as diarrhea has completely resolved  -Started on florastor - continued at time of discharge  HTN  -BP controlled with carvedilol, resume Losartan on discharge  Type 2 DM  -CBG's moderately controlled, c/w SSI while inpatient. Held all oral hypoglycemic agents -Started back on oral medications at time of discharge   GERD  -c/w BID protonix  -GI cocktail given prn -Stable at discharge  Hyponatremia  -Most likely due to low po intake and dehydration  -Given IVF - hyponatremia has resolved  Nephroliths and Renal Cyst  -CT shows bilateral renal calculus and renal cyst With possible solid component, recommend outpatient renal Ultrasound or MRI Kidney's. Discussed with patient, made her aware, she will address with her PCP  Procedures:  None  Consultations:  None  Discharge Exam: Filed Vitals:   10/16/13 0820  BP: 165/75  Pulse: 66  Temp:   Resp:     Exam:  General: Pleasant, Well developed, well nourished, NAD, appears stated age  41: Anicteic Sclera, MMM. No pharyngeal erythema or exudates  Neck: Supple, no masses  Cardiovascular:S1 S2 auscultated, no rubs, murmurs or gallops.  Respiratory: Clear to auscultation bilaterally with equal chest rise  Abdomen: Soft, nontender, nondistended, + bowel sounds, No Right CVA tenderness  Extremities: warm dry without cyanosis or edema.  Skin: Without rashes exudates or nodules.  Psych: Normal affect and demeanor with  intact judgement and insight   Discharge Instructions     Discharge Instructions   Diet - low sodium heart healthy    Complete by:  As directed      Increase activity slowly    Complete by:  As directed             Medication List    STOP taking these medications       doxycycline 100 MG capsule  Commonly known as:  VIBRAMYCIN      TAKE these medications       carvedilol 25 MG tablet  Commonly known as:  COREG  Take 25 mg by mouth 2 (two) times daily with a meal.     ciprofloxacin 500 MG tablet  Commonly known as:  CIPRO  Take 1 tablet (500 mg total) by mouth 2 (two) times daily.     glimepiride 4 MG tablet  Commonly known as:  AMARYL  Take 4 mg by mouth 2 (two) times daily.     hydrALAZINE 25 MG tablet  Commonly known as:  APRESOLINE  Take 1 tablet by mouth Twice daily.     INVOKANA 100 MG Tabs  Generic drug:  Canagliflozin  Take 100 mg by mouth daily.     losartan 100 MG tablet  Commonly known as:  COZAAR  Take 100 mg by mouth daily.     metFORMIN 1000 MG tablet  Commonly known as:  GLUCOPHAGE  Take 1,000 mg by mouth 2 (two) times daily with a meal.     omeprazole 20 MG capsule  Commonly known as:  PRILOSEC  Take 20 mg by mouth daily.     pravastatin 20 MG tablet  Commonly known as:  PRAVACHOL  Take 1 tablet by mouth Daily.     saccharomyces boulardii 250 MG capsule  Commonly known as:  FLORASTOR  Take 1 capsule (250 mg total) by mouth 2 (two) times daily.       Allergies  Allergen Reactions  . Codeine Shortness Of Breath  . Ace Inhibitors   . Advil [Ibuprofen]   . Aleve [Naproxen Sodium]   . Boniva [Ibandronic Acid]     Swelling   . Clonidine Derivatives   . Januvia [Sitagliptin Phosphate]   . Penicillins     Swelling   . Sulfa Antibiotics     Throat swells   Follow-up Information   Follow up with PERRY,LAWRENCE EDWARD, MD. Schedule an appointment as soon as possible for a visit in 1 week.   Specialty:  Family Medicine    Contact information:   [address] [place] [address] [phone]        The results of significant diagnostics from this hospitalization (including imaging, microbiology, ancillary and laboratory) are listed below for reference.    Significant Diagnostic Studies: Ct Abdomen Pelvis Wo Contrast  10/14/2013   CLINICAL DATA:  Right flank pain  EXAM: CT ABDOMEN AND PELVIS WITHOUT CONTRAST  TECHNIQUE: Multidetector CT imaging of the abdomen and pelvis was performed following the standard protocol without IV contrast.  COMPARISON:  None.  FINDINGS: Lung bases are essentially clear.  Liver is notable for a 14 mm probable cyst in  the posterior segment right hepatic lobe (series 201/image 27).  Spleen, pancreas, adrenal glands are within normal limits.  Gallbladder is unremarkable. No intrahepatic or extrahepatic ductal dilatation.  2 mm nonobstructing right lower pole renal calculus (series 201/ image 46). 12 mm nonobstructing left lower pole renal calculus (series 201/image 40). 2.5 cm mildly hyperdense anterior interpolar probable left renal cyst (series 201/ image 38), although technically indeterminate. No hydronephrosis.  No evidence of bowel obstruction. Prior appendectomy. Colonic diverticulosis, without associated inflammatory changes.  Atherosclerotic calcifications of the abdominal aorta and branch vessels.  No abdominopelvic ascites.  No suspicious abdominopelvic lymphadenopathy.  No ureteral or bladder calculi.  Bladder is within normal limits.  Mild degenerative changes of the lower thoracic spine.  IMPRESSION: Bilateral nonobstructing renal calculi, measuring 12 mm on the left and 2 mm on the right. No hydronephrosis.  2.5 cm mildly hyperdense anterior interpolar probable left renal cyst, technically indeterminate. Given size, consider renal ultrasound for initial evaluation to assess for cystic versus solid mass.   Electronically Signed   By: Julian Hy M.D.   On: 10/14/2013 16:08     Microbiology: Recent Results (from the past 240 hour(s))  URINE CULTURE     Status: None   Collection Time    10/14/13  2:16 PM      Result Value Ref Range Status   Specimen Description URINE, RANDOM   Final   Special Requests NONE   Final   Culture  Setup Time     Final   Value: 10/14/2013 21:17     Performed at [facility]     Final   Value: >=100,000 COLONIES/ML     Performed at [facility]   Culture     Final   Value: ESCHERICHIA COLI     Performed at [facility]   Report Status 10/16/2013 FINAL   Final   Organism ID, Bacteria ESCHERICHIA COLI   Final  CULTURE, BLOOD (ROUTINE X 2)     Status: None   Collection Time    10/14/13  6:25 PM      Result Value Ref Range Status   Specimen Description BLOOD RIGHT HAND   Final   Special Requests BOTTLES DRAWN AEROBIC AND ANAEROBIC 5CC EA   Final   Culture  Setup Time     Final   Value: 10/15/2013 00:52     Performed at [facility]   Culture     Final   Value:        BLOOD CULTURE RECEIVED NO GROWTH TO DATE CULTURE WILL BE HELD FOR 5 DAYS BEFORE ISSUING A FINAL NEGATIVE REPORT     Performed at [facility]   Report Status PENDING   Incomplete  CULTURE, BLOOD (ROUTINE X 2)     Status: None   Collection Time    10/14/13  6:30 PM      Result Value Ref Range Status   Specimen Description BLOOD RIGHT ANTECUBITAL   Final   Special Requests BOTTLES DRAWN AEROBIC AND ANAEROBIC 5CC EA   Final   Culture  Setup Time     Final   Value: 10/15/2013 00:52     Performed at [facility]   Culture     Final   Value:        BLOOD CULTURE RECEIVED NO GROWTH TO DATE CULTURE WILL BE HELD FOR 5 DAYS BEFORE ISSUING A FINAL NEGATIVE REPORT     Performed at  [facility]   Report Status PENDING   Incomplete     Labs: Basic Metabolic Panel:  Recent Labs Lab 10/14/13 1410 10/15/13 0450 10/16/13 0832  NA 135* 135* 141  K 4.9 4.0 4.8  CL 95* 98 104  CO2 21 24 23    GLUCOSE 260* 145* 219*  BUN 32* 29* 19  CREATININE 1.51* 1.59* 1.34*  CALCIUM 9.6 9.2 9.5   Liver Function Tests:  Recent Labs Lab 10/14/13 1410  AST 16  ALT 9  ALKPHOS 63  BILITOT 0.6  PROT 6.7  ALBUMIN 3.3*   No results found for this basename: LIPASE, AMYLASE,  in the last 168 hours No results found for this basename: AMMONIA,  in the last 168 hours CBC:  Recent Labs Lab 10/14/13 1410 10/15/13 0450  WBC 12.9* 9.4  NEUTROABS 10.0*  --   HGB 11.3* 9.3*  HCT 34.9* 29.4*  MCV 82.7 83.8  PLT 270 241   Cardiac Enzymes: No results found for this basename: CKTOTAL, CKMB, CKMBINDEX, TROPONINI,  in the last 168 hours BNP: BNP (last 3 results) No results found for this basename: PROBNP,  in the last 8760 hours CBG:  Recent Labs Lab 10/15/13 0800 10/15/13 1158 10/15/13 1652 10/15/13 2110 10/16/13 0745  GLUCAP 185* 327* 225* 297* 206*   Signed:  Ferne Reus, PA-S   [facility] 10/16/2013, 10:37 AM  Attending  Patient was seen, examined,treatment plan was discussed with the Physician extender. I have directly reviewed the clinical findings, lab, imaging studies and management of this patient in detail. I have made the necessary changes to the above noted documentation, and agree with the documentation, as recorded by the Physician extender.  Nena Alexander MD [facility].

## 2013-10-21 LAB — CULTURE, BLOOD (ROUTINE X 2)
CULTURE: NO GROWTH
Culture: NO GROWTH

## 2015-02-12 ENCOUNTER — Encounter: Payer: Self-pay | Admitting: Internal Medicine

## 2015-02-12 ENCOUNTER — Ambulatory Visit (INDEPENDENT_AMBULATORY_CARE_PROVIDER_SITE_OTHER): Payer: Medicare Other | Admitting: Internal Medicine

## 2015-02-12 VITALS — BP 138/62 | HR 65 | Ht 64.5 in | Wt 154.2 lb

## 2015-02-12 DIAGNOSIS — R0600 Dyspnea, unspecified: Secondary | ICD-10-CM | POA: Diagnosis not present

## 2015-02-12 DIAGNOSIS — R0989 Other specified symptoms and signs involving the circulatory and respiratory systems: Secondary | ICD-10-CM | POA: Diagnosis not present

## 2015-02-12 DIAGNOSIS — R0689 Other abnormalities of breathing: Secondary | ICD-10-CM | POA: Diagnosis not present

## 2015-02-12 NOTE — Patient Instructions (Signed)
ICD-9-CM ICD-10-CM   1. Dyspnea and respiratory abnormality 786.09 R06.00     R06.89   2. Bibasilar crackles 786.7 R09.89     Do high resolution CT chest Do full pulmonary function test  Follow-up - He can do the above tests at any time at your convenience we will call you with results-   - Return to see me after the above; first available is fine

## 2015-02-12 NOTE — Addendum Note (Signed)
Addended by: Maurice March on: 02/12/2015 04:06 PM   Modules accepted: Orders

## 2015-02-12 NOTE — Progress Notes (Addendum)
Subjective:     Patient ID: Ann Hunter, female   DOB: 1945/08/28, 69 y.o.   MRN: [id number]  HPI     OV 02/12/2015  Chief Complaint  Patient presents with  . Follow-up    Pt c/o of occasional dyspnea with exertion that most of the time improves after coffee and eating.    Follow-up dyspnea  Last seen December 2013. This was nearly 3 years ago. At that time diagnosis was that she had some nonspecific scarring in the lung tissue based on the CT chest October 2013. She had autoimmune workup which was negative. Her pulmonary function tests in September 2013 showed an FVC 2.1 L/69%, FEV1 1.6 L/69% with a normal ratio. Total lung capacity was low at 3 L/59%. DLCO is low at 14/25%. I personally visualized this trace At that time she was beginning to feel better. Findings the attribute it to Escherichia coli related septic shock related respiratory failure from a year earlier. And she was discharged from follow-up.  She returns now for follow-up. She says overall that her health is been stable. Some few weeks ago she did have her atypical chest pain [review of the old note records from 2013 reported the scan of symptoms even back then]. Apparently she was taken back and was to [facility]. She had extensive cardiac workup. She was told that the heart was normal. Review of the chart shows the same kind of conversation from 3 years ago. It is still unclear to me whether she has ongoing dyspnea or not. At one point she says he has one point she says no. Nevertheless she says that her doctors have asked her to come and evaluate herself with pulmonary.  Also medical records from Dr. Lillard Anes R 11/12/2014 shows hemoglobin of 12 g percent, creatinine of 1.07 mg percent and hemoglobin A1c that was elevated at 7.6 and an office visit with him on 12/03/2014 that addresses her well visit  Walking desaturation test 185 feet 3 laps on room air did not desaturate  Of note she does not want  to follow with the nurse practitioner only wants to see me for follow-up.   Review of Systems  Positive atypical chest pain and possibly for dyspnea but otherwise negative     Objective:   Physical Exam  Constitutional: She is oriented to person, place, and time. She appears well-developed and well-nourished. No distress.  HENT:  Head: Normocephalic and atraumatic.  Right Ear: External ear normal.  Left Ear: External ear normal.  Mouth/Throat: Oropharynx is clear and moist. No oropharyngeal exudate.  Eyes: Conjunctivae and EOM are normal. Pupils are equal, round, and reactive to light. Right eye exhibits no discharge. Left eye exhibits no discharge. No scleral icterus.  Neck: Normal range of motion. Neck supple. No JVD present. No tracheal deviation present. No thyromegaly present.  Cardiovascular: Normal rate, regular rhythm, normal heart sounds and intact distal pulses.  Exam reveals no gallop and no friction rub.   No murmur heard. Pulmonary/Chest: Effort normal. No respiratory distress. She has no wheezes. She has rales. She exhibits no tenderness.  Crackles in the right base  Abdominal: Soft. Bowel sounds are normal. She exhibits no distension and no mass. There is no tenderness. There is no rebound and no guarding.  Musculoskeletal: Normal range of motion. She exhibits no edema or tenderness.  Lymphadenopathy:    She has no cervical adenopathy.  Neurological: She is alert and oriented to person, place, and time. She has  normal reflexes. No cranial nerve deficit. She exhibits normal muscle tone. Coordination normal.  Skin: Skin is warm and dry. No rash noted. She is not diaphoretic. No erythema. No pallor.  Psychiatric: She has a normal mood and affect. Her behavior is normal. Judgment and thought content normal.  Vitals reviewed.   Filed Vitals:   02/12/15 1523  BP: 138/62  Pulse: 65  Height: 5' 4.5" (1.638 m)  Weight: 154 lb 3.2 oz (69.945 kg)  SpO2: 98%         Assessment:       ICD-9-CM ICD-10-CM   1. Dyspnea and respiratory abnormality 786.09 R06.00     R06.89   2. Bibasilar crackles 786.7 R09.89         Plan:      Unclear why she is back. I presume this is because of crackles and persistent dyspnea that is atypical. She does have crackles on her chest especially the right base. We attribute it 3 years ago that her nonspecific scarring findings on CT chest was due to respiratory failure from 4 years ago. We will make sure that she's in the interim she has not developed progressive interstitial lung disease. Therefore plan  Do high resolution CT chest Do full pulmonary function test  Follow-up - He can do the above tests at any time at your convenience we will call you with results-   - Return to see me after the above; first available is fine   (She does not want to follow-up with nurse practitioner)    Dr. Brand Males, M.D., [facility].C.P Pulmonary and Critical Care Medicine Staff Physician [place] [facility] Pager: [phone], If no answer or between  15:00h - 7:00h: call [phone]  02/12/2015 3:55 PM

## 2015-02-12 NOTE — Addendum Note (Signed)
Addended by: Beckie Busing on: 02/12/2015 04:08 PM   Modules accepted: Orders, Medications

## 2015-02-17 ENCOUNTER — Ambulatory Visit (INDEPENDENT_AMBULATORY_CARE_PROVIDER_SITE_OTHER)
Admission: RE | Admit: 2015-02-17 | Discharge: 2015-02-17 | Disposition: A | Discharge status: Home / Self Care | Payer: Medicare Other | Source: Ambulatory Visit | Attending: Internal Medicine | Admitting: Internal Medicine

## 2015-02-17 DIAGNOSIS — R0600 Dyspnea, unspecified: Secondary | ICD-10-CM | POA: Diagnosis not present

## 2015-02-17 DIAGNOSIS — R0989 Other specified symptoms and signs involving the circulatory and respiratory systems: Secondary | ICD-10-CM

## 2015-02-17 DIAGNOSIS — R0689 Other abnormalities of breathing: Secondary | ICD-10-CM | POA: Diagnosis not present

## 2015-02-21 ENCOUNTER — Telehealth: Payer: Self-pay | Admitting: Internal Medicine

## 2015-02-21 NOTE — Telephone Encounter (Signed)
Spoke with pt, aware of results/recs.  Nothing further needed.  

## 2015-02-21 NOTE — Telephone Encounter (Signed)
Let Ann Hunter know that no ILD on CT chest. Keep up PFT appt in dec and ov in jan 2017  Dr. Brand Males, M.D., [facility].C.P Pulmonary and Critical Care Medicine Staff Physician [place] [facility] Pager: [phone], If no answer or between  15:00h - 7:00h: call [phone]  02/21/2015 1:39 PM

## 2015-03-18 ENCOUNTER — Ambulatory Visit (INDEPENDENT_AMBULATORY_CARE_PROVIDER_SITE_OTHER): Payer: Medicare Other | Admitting: Internal Medicine

## 2015-03-18 DIAGNOSIS — R0689 Other abnormalities of breathing: Secondary | ICD-10-CM | POA: Diagnosis not present

## 2015-03-18 DIAGNOSIS — R0600 Dyspnea, unspecified: Secondary | ICD-10-CM

## 2015-03-18 DIAGNOSIS — R0989 Other specified symptoms and signs involving the circulatory and respiratory systems: Secondary | ICD-10-CM

## 2015-03-18 LAB — PULMONARY FUNCTION TEST
DL/VA % pred: 79 %
DL/VA: 3.83 ml/min/mmHg/L
DLCO UNC % PRED: 60 %
DLCO UNC: 14.64 ml/min/mmHg
FEF 25-75 Post: 1.15 L/sec
FEF 25-75 Pre: 1.19 L/sec
FEF2575-%CHANGE-POST: -3 %
FEF2575-%PRED-POST: 61 %
FEF2575-%Pred-Pre: 63 %
FEV1-%CHANGE-POST: 0 %
FEV1-%PRED-POST: 74 %
FEV1-%Pred-Pre: 74 %
FEV1-POST: 1.67 L
FEV1-PRE: 1.68 L
FEV1FVC-%CHANGE-POST: 4 %
FEV1FVC-%PRED-PRE: 97 %
FEV6-%Change-Post: -4 %
FEV6-%Pred-Post: 76 %
FEV6-%Pred-Pre: 79 %
FEV6-PRE: 2.27 L
FEV6-Post: 2.17 L
FEV6FVC-%PRED-PRE: 104 %
FEV6FVC-%Pred-Post: 104 %
FVC-%CHANGE-POST: -4 %
FVC-%Pred-Post: 73 %
FVC-%Pred-Pre: 76 %
FVC-PRE: 2.27 L
FVC-Post: 2.17 L
POST FEV1/FVC RATIO: 77 %
PRE FEV6/FVC RATIO: 100 %
Post FEV6/FVC ratio: 100 %
Pre FEV1/FVC ratio: 74 %

## 2015-03-18 NOTE — Progress Notes (Signed)
PFT done today. 

## 2015-04-23 ENCOUNTER — Ambulatory Visit (INDEPENDENT_AMBULATORY_CARE_PROVIDER_SITE_OTHER): Payer: Medicare Other | Admitting: Internal Medicine

## 2015-04-23 ENCOUNTER — Encounter: Payer: Self-pay | Admitting: Internal Medicine

## 2015-04-23 VITALS — BP 138/70 | HR 68 | Ht 64.5 in | Wt 155.6 lb

## 2015-04-23 DIAGNOSIS — R0689 Other abnormalities of breathing: Secondary | ICD-10-CM | POA: Diagnosis not present

## 2015-04-23 DIAGNOSIS — R0600 Dyspnea, unspecified: Secondary | ICD-10-CM

## 2015-04-23 NOTE — Progress Notes (Signed)
Subjective:     Patient ID: Ann Hunter, female   DOB: [birth year], 70 y.o.   MRN: [id number]  HPI    OV 02/12/2015  Chief Complaint  Patient presents with  . Follow-up    Pt c/o of occasional dyspnea with exertion that most of the time improves after coffee and eating.    Follow-up dyspnea  Last seen December 2013. This was nearly 3 years ago. At that time diagnosis was that she had some nonspecific scarring in the lung tissue based on the CT chest October 2013. She had autoimmune workup which was negative. Her pulmonary function tests in September 2013 showed an FVC 2.1 L/69%, FEV1 1.6 L/69% with a normal ratio. Total lung capacity was low at 3 L/59%. DLCO is low at 14/25%. I personally visualized this trace At that time she was beginning to feel better. Findings the attribute it to Escherichia coli related septic shock related respiratory failure from a year earlier. And she was discharged from follow-up.  She returns now for follow-up. She says overall that her health is been stable. Some few weeks ago she did have her atypical chest pain [review of the old note records from 2013 reported the scan of symptoms even back then]. Apparently she was taken back and was to [facility]. She had extensive cardiac workup. She was told that the heart was normal. Review of the chart shows the same kind of conversation from 3 years ago. It is still unclear to me whether she has ongoing dyspnea or not. At one point she says he has one point she says no. Nevertheless she says that her doctors have asked her to come and evaluate herself with pulmonary.  Also medical records from Dr. Lillard Anes R 11/12/2014 shows hemoglobin of 12 g percent, creatinine of 1.07 mg percent and hemoglobin A1c that was elevated at 7.6 and an office visit with him on 12/03/2014 that addresses her well visit  Walking desaturation test 185 feet 3 laps on room air did not desaturate  Of note she does not want  to follow with the nurse practitioner only wants to see me for follow-up.   Review of Systems  Positive atypical chest pain and possibly for dyspnea but otherwise negative   OV 04/23/2015  Chief Complaint  Patient presents with  . Shortness of Breath    Still having occ SOB. Denies any wheezing, cp/tightness.   Follow-up for dyspnea with finding of crackles on exam. Concern was for interstitial lung disease. Therefore she had pulmonary function test 03/18/2015 that shows FVC of 2.3 L/76%, FEV1 of 1.68 L/74% with a ratio of 97 suggesting restriction. DLCO 14.6/60% again consistent with reduced diffusion and raising the possibility of interstitial lung disease   CT chest high resolution 2016 personally visualized the image done 02/17/2015: is no clear-cut evidence of interstitial lung disease according to thoracic radiologist dr. Varney Biles I agree with the findings. There might be some basal atelectasis which could account for the crackles  She still continues to have on and off dyspnea. She wants to go to the bottom of this. She's undergoing presumably BCG therapy for bladder cancer which will end in ferry 2017  Review of Systems     Objective:   Physical Exam  Filed Vitals:   04/23/15 1117  BP: 138/70  Pulse: 68  Height: 5' 4.5" (1.638 m)  Weight: 155 lb 9.6 oz (70.58 kg)  SpO2: 97%   Body mass index is 26.31 kg/(m^2).  Assessment:       ICD-9-CM ICD-10-CM   1. Dyspnea and respiratory abnormality 786.09 R06.00     R06.89    Cause of dyspnea is unclear but differential diagnoses includes diastolic dysfunction coronary artery disease, being overweight and physical deconditioning. She wants to get to the bottom of her etiologies and is interested in pulmonary stress test. However she wants to wait until she completes a BCG vaccination treatment for bladder cancer which should be completed in February 2017.    Plan:       Do cardiopulmonary stress test with  exercise-induced broncho-challenge sometime in March 2017. This will be done by Landis Martins at [facility]. It is an outpatient procedure. Pleased to tell her about it particular desire as to how we want the nose clip inserted  Follow-up April 2017 after pulmonary stress test in March 2017   (> 50% of this 15 min visit spent in face to face counseling or/and coordination of care)   Dr. Brand Males, M.D., [facility].C.P Pulmonary and Critical Care Medicine Staff Physician [place] [facility] Pager: [phone], If no answer or between  15:00h - 7:00h: call [phone]  04/23/2015 11:39 AM

## 2015-04-23 NOTE — Patient Instructions (Signed)
ICD-9-CM ICD-10-CM   1. Dyspnea and respiratory abnormality 786.09 R06.00     R06.89     Do cardiopulmonary stress test with exercise-induced broncho-challenge sometime in March 2017. This will be done by Landis Martins at [facility]. It is an outpatient procedure. Pleased to tell her about it particular desire as to how we want the nose clip inserted  Follow-up April 2017 after pulmonary stress test in March 2017

## 2015-06-18 ENCOUNTER — Ambulatory Visit (HOSPITAL_COMMUNITY): Payer: Medicare Other | Attending: Internal Medicine

## 2015-06-18 DIAGNOSIS — R0689 Other abnormalities of breathing: Secondary | ICD-10-CM

## 2015-06-18 DIAGNOSIS — R0600 Dyspnea, unspecified: Secondary | ICD-10-CM | POA: Diagnosis not present

## 2015-07-07 DIAGNOSIS — R0600 Dyspnea, unspecified: Secondary | ICD-10-CM | POA: Diagnosis not present

## 2015-07-07 NOTE — Progress Notes (Signed)
Quick Note:  Called and spoke to pt. Informed her of the recs per MR. Pt has an appt on 4/3 with MR. Pt verbalized understanding and denied any further questions or concerns at this time.   ______

## 2015-07-14 ENCOUNTER — Ambulatory Visit (INDEPENDENT_AMBULATORY_CARE_PROVIDER_SITE_OTHER): Payer: Medicare Other | Admitting: Internal Medicine

## 2015-07-14 ENCOUNTER — Encounter: Payer: Self-pay | Admitting: Internal Medicine

## 2015-07-14 VITALS — BP 144/62 | HR 64 | Ht 64.5 in | Wt 150.8 lb

## 2015-07-14 DIAGNOSIS — R0600 Dyspnea, unspecified: Secondary | ICD-10-CM

## 2015-07-14 DIAGNOSIS — R0689 Other abnormalities of breathing: Secondary | ICD-10-CM

## 2015-07-14 NOTE — Patient Instructions (Signed)
ICD-9-CM ICD-10-CM   1. Dyspnea and respiratory abnormality 786.09 R06.00     R06.89     - likely heart related - diastolic dysfunction  PLAN  --re-refer Dr Kirk Ruths of cardiology - refer pulmonary rehab at [facility]  Followup  6 months or sooner if needed

## 2015-07-14 NOTE — Progress Notes (Signed)
Subjective:     Patient ID: Ann Hunter, female   DOB: July 29, 1945, 70 y.o.   MRN: [id number]  HPI    OV 02/12/2015  Chief Complaint  Patient presents with  . Follow-up    Pt c/o of occasional dyspnea with exertion that most of the time improves after coffee and eating.    Follow-up dyspnea  Last seen December 2013. This was nearly 3 years ago. At that time diagnosis was that she had some nonspecific scarring in the lung tissue based on the CT chest October 2013. She had autoimmune workup which was negative. Her pulmonary function tests in September 2013 showed an FVC 2.1 L/69%, FEV1 1.6 L/69% with a normal ratio. Total lung capacity was low at 3 L/59%. DLCO is low at 14/25%. I personally visualized this trace At that time she was beginning to feel better. Findings the attribute it to Escherichia coli related septic shock related respiratory failure from a year earlier. And she was discharged from follow-up.  She returns now for follow-up. She says overall that her health is been stable. Some few weeks ago she did have her atypical chest pain [review of the old note records from 2013 reported the scan of symptoms even back then]. Apparently she was taken back and was to [facility]. She had extensive cardiac workup. She was told that the heart was normal. Review of the chart shows the same kind of conversation from 3 years ago. It is still unclear to me whether she has ongoing dyspnea or not. At one point she says he has one point she says no. Nevertheless she says that her doctors have asked her to come and evaluate herself with pulmonary.  Also medical records from Dr. Lillard Anes R 11/12/2014 shows hemoglobin of 12 g percent, creatinine of 1.07 mg percent and hemoglobin A1c that was elevated at 7.6 and an office visit with him on 12/03/2014 that addresses her well visit  Walking desaturation test 185 feet 3 laps on room air did not desaturate  Of note she does not want  to follow with the nurse practitioner only wants to see me for follow-up.   Review of Systems  Positive atypical chest pain and possibly for dyspnea but otherwise negative   OV 04/23/2015  Chief Complaint  Patient presents with  . Shortness of Breath    Still having occ SOB. Denies any wheezing, cp/tightness.   Follow-up for dyspnea with finding of crackles on exam. Concern was for interstitial lung disease. Therefore she had pulmonary function test 03/18/2015 that shows FVC of 2.3 L/76%, FEV1 of 1.68 L/74% with a ratio of 97 suggesting restriction. DLCO 14.6/60% again consistent with reduced diffusion and raising the possibility of interstitial lung disease   CT chest high resolution 2016 personally visualized the image done 02/17/2015: is no clear-cut evidence of interstitial lung disease according to thoracic radiologist dr. Varney Biles I agree with the findings. There might be some basal atelectasis which could account for the crackles  She still continues to have on and off dyspnea. She wants to go to the bottom of this. She's undergoing presumably BCG therapy for bladder cancer which will end in [place] 2017    OV 07/14/2015  Chief Complaint  Patient presents with  . Follow-up    Pt here after CPST. Pt states her breathing is unchanged since last OV in 04/2015. Pt denies cough, wheezing, and CP/tightness.     fu cpst report   Had CPST 06/18/15 -  shows good effort. Despite good effort has low Vo2 max and borderline low AT. No pulmonary limitation. Has high Heart rate reserve. Stroke volume low. Not on beat blocker. 9 panel plit suggests flattening of STroke volume curve - suggests diast dysfunction  Again spoke to her about cards workup - she says inpatient workup at [facility] few years ago was normal. Does not know what test and details. Per record last OV here was with Dr Stanford Breed 2013 - he had planned nuc med stress test but no evidence was done. She has agreed to see him again   has  a past medical history of Osteoporosis, unspecified; Pure hypercholesterolemia; Essential hypertension, benign; Unspecified vitamin D deficiency; Head injury, unspecified; Type II or unspecified type diabetes mellitus without mention of complication, uncontrolled; Paroxysmal atrial fibrillation ([place]); B12 deficiency; Closed head injury; and Memory difficulty.   reports that she has never smoked. She does not have any smokeless tobacco history on file.  Past Surgical History  Procedure Laterality Date  . Appendectomy  1981  . Tonsillectomy    . Total abdominal hysterectomy  1981  . Cardiac catheterization      Allergies  Allergen Reactions  . Codeine Shortness Of Breath    Throat Swelling  . Ranitidine Anaphylaxis  . Ace Inhibitors   . Advil [Ibuprofen]   . Aleve [Naproxen Sodium]   . Boniva [Ibandronic Acid]     Swelling   . Clonidine Derivatives   . Januvia [Sitagliptin Phosphate]   . Penicillins     Swelling   . Sulfa Antibiotics     Throat swells    There is no immunization history for the selected administration types on file for this patient.  Family History  Problem Relation Age of Onset  . Coronary artery disease Mother     MI  . Heart disease Father     CHF  . Diabetes Sister   . Thyroid disease Sister   . Arthritis Sister   . Diabetes Sister   . Diabetes Sister      Current outpatient prescriptions:  .  glimepiride (AMARYL) 4 MG tablet, Take 4 mg by mouth 2 (two) times daily. , Disp: , Rfl:  .  hydrALAZINE (APRESOLINE) 50 MG tablet, Take 50 mg by mouth 2 (two) times daily., Disp: , Rfl: 3 .  losartan (COZAAR) 100 MG tablet, Take 100 mg by mouth daily.  , Disp: , Rfl:  .  metFORMIN (GLUCOPHAGE) 1000 MG tablet, Take 1,000 mg by mouth 2 (two) times daily with a meal.  , Disp: , Rfl:  .  pravastatin (PRAVACHOL) 20 MG tablet, Take 1 tablet by mouth Daily., Disp: , Rfl:      Review of Systems     Objective:   Physical Exam  Filed Vitals:   07/14/15  1101  BP: 144/62  Pulse: 64  Height: 5' 4.5" (1.638 m)  Weight: 150 lb 12.8 oz (68.402 kg)  SpO2: 97%   Estimated body mass index is 25.49 kg/(m^2) as calculated from the following:   Height as of this encounter: 5' 4.5" (1.638 m).   Weight as of this encounter: 150 lb 12.8 oz (68.402 kg).      Assessment:       ICD-9-CM ICD-10-CM   1. Dyspnea and respiratory abnormality 786.09 R06.00     R06.89        Plan:       - likely heart related - diastolic dysfunction  PLAN  --re-refer Dr Kirk Ruths of  cardiology - refer pulmonary rehab at [facility]  Followup  6 months or sooner if needed  (> 50% of this 15 min visit spent in face to face counseling or/and coordination of care)   Dr. Brand Males, M.D., [facility].C.P Pulmonary and Critical Care Medicine Staff Physician [facility] Pager: [phone], If no answer or between  15:00h - 7:00h: call [phone]  07/14/2015 11:43 AM

## 2015-08-03 NOTE — Progress Notes (Signed)
HPI: 70 year old female for evaluation of dyspnea. Previously seen in this office but not since November 2013. Patient apparently had an episode of atrial fibrillation in the setting of urosepsis previously. Echocardiogram [place] 2012 showed normal LV systolic function and grade 1 diastolic dysfunction. Mild left atrial enlargement. Nuclear study December 2013 showed ejection fraction 73% and normal perfusion. Stress echocardiogram September 2016 [place] normal.  High-resolution CT November 2016 did not show interstitial lung disease. There was note of three-vessel coronary artery calcification. CPX March 2017 suggested functional limitation due to circulatory limitation. Findings suggestive of diastolic dysfunction and pulmonary hypertension. Patient states she has had dyspnea on exertion for over 6 months.It is slowly worsening. She does not have orthopnea, PND, pedal edema, palpitations, syncope or chest pain.  Current Outpatient Prescriptions  Medication Sig Dispense Refill  . glimepiride (AMARYL) 4 MG tablet Take 4 mg by mouth 2 (two) times daily.     . hydrALAZINE (APRESOLINE) 50 MG tablet Take 50 mg by mouth 2 (two) times daily.  3  . losartan (COZAAR) 100 MG tablet Take 100 mg by mouth daily.      . metFORMIN (GLUCOPHAGE) 1000 MG tablet Take 1,000 mg by mouth 2 (two) times daily with a meal.      . pravastatin (PRAVACHOL) 20 MG tablet Take 1 tablet by mouth Daily.     No current facility-administered medications for this visit.    Allergies  Allergen Reactions  . Codeine Shortness Of Breath    Throat Swelling  . Ranitidine Anaphylaxis  . Ace Inhibitors   . Advil [Ibuprofen]   . Aleve [Naproxen Sodium]   . Boniva [Ibandronic Acid]     Swelling   . Clonidine Derivatives   . Januvia [Sitagliptin Phosphate]   . Penicillins     Swelling   . Sulfa Antibiotics     Throat swells     Past Medical History  Diagnosis Date  . Osteoporosis, unspecified   . Pure  hypercholesterolemia   . Essential hypertension, benign   . Unspecified vitamin D deficiency   . Head injury, unspecified   . Type II or unspecified type diabetes mellitus without mention of complication, uncontrolled   . Paroxysmal atrial fibrillation (HCC)     during septic shock, none since  . B12 deficiency   . Closed head injury   . Memory difficulty     d/t head injury  . GERD (gastroesophageal reflux disease)   . Bladder cancer [facility])     Past Surgical History  Procedure Laterality Date  . Appendectomy  1981  . Tonsillectomy    . Total abdominal hysterectomy  1981  . Cardiac catheterization      Social History   Social History  . Marital Status: Married    Spouse Name: N/A  . Number of Children: 3  . Years of Education: N/A   Occupational History  . Not on file.   Social History Main Topics  . Smoking status: Never Smoker   . Smokeless tobacco: Not on file  . Alcohol Use: No  . Drug Use: No  . Sexual Activity: Not on file   Other Topics Concern  . Not on file   Social History Narrative   Married 3 sons   Current work status : Unemployed, not looking for work   No illicit drug use   Alcohol use : No alcohol use   Tobacco use: Never smoke  Family History  Problem Relation Age of Onset  . Coronary artery disease Mother     MI  . Heart disease Father     CHF  . Diabetes Sister   . Thyroid disease Sister   . Arthritis Sister   . Diabetes Sister   . Diabetes Sister     ROS: no fevers or chills, productive cough, hemoptysis, dysphasia, odynophagia, melena, hematochezia, dysuria, hematuria, rash, seizure activity, orthopnea, PND, pedal edema, claudication. Remaining systems are negative.  Physical Exam:   Blood pressure 140/60, pulse 71, height 5\' 4"  (1.626 m), weight 149 lb (67.586 kg).  General:  Well developed/well nourished in NAD Skin warm/dry Patient not depressed No peripheral clubbing Back-normal HEENT-normal/normal  eyelids Neck supple/normal carotid upstroke bilaterally; no bruits; no JVD; no thyromegaly chest - CTA/ normal expansion CV - RRR/normal S1 and S2; no murmurs, rubs or gallops;  PMI nondisplaced Abdomen -NT/ND, no HSM, no mass, + bowel sounds, no bruit 2+ femoral pulses, no bruits Ext-no edema, chords, 2+ DP Neuro-grossly nonfocal  ECG Normal sinus rhythm at a rate of 71. Nonspecific ST changes.

## 2015-08-07 ENCOUNTER — Other Ambulatory Visit: Payer: Self-pay | Admitting: Cardiology

## 2015-08-07 ENCOUNTER — Encounter: Payer: Self-pay | Admitting: *Deleted

## 2015-08-07 ENCOUNTER — Ambulatory Visit (INDEPENDENT_AMBULATORY_CARE_PROVIDER_SITE_OTHER): Payer: Medicare Other | Admitting: Cardiology

## 2015-08-07 ENCOUNTER — Encounter: Payer: Self-pay | Admitting: Cardiology

## 2015-08-07 VITALS — BP 140/60 | HR 71 | Ht 64.0 in | Wt 149.0 lb

## 2015-08-07 DIAGNOSIS — E782 Mixed hyperlipidemia: Secondary | ICD-10-CM

## 2015-08-07 DIAGNOSIS — Z01818 Encounter for other preprocedural examination: Secondary | ICD-10-CM | POA: Diagnosis not present

## 2015-08-07 DIAGNOSIS — R0600 Dyspnea, unspecified: Secondary | ICD-10-CM

## 2015-08-07 DIAGNOSIS — I10 Essential (primary) hypertension: Secondary | ICD-10-CM | POA: Diagnosis not present

## 2015-08-07 DIAGNOSIS — I480 Paroxysmal atrial fibrillation: Secondary | ICD-10-CM

## 2015-08-07 LAB — BASIC METABOLIC PANEL
BUN: 25 mg/dL (ref 7–25)
CALCIUM: 10.7 mg/dL — AB (ref 8.6–10.4)
CO2: 23 mmol/L (ref 20–31)
Chloride: 102 mmol/L (ref 98–110)
Creat: 1.18 mg/dL — ABNORMAL HIGH (ref 0.60–0.93)
Glucose, Bld: 170 mg/dL — ABNORMAL HIGH (ref 65–99)
POTASSIUM: 5.1 mmol/L (ref 3.5–5.3)
SODIUM: 138 mmol/L (ref 135–146)

## 2015-08-07 LAB — CBC
HEMATOCRIT: 36 % (ref 35.0–45.0)
Hemoglobin: 11.3 g/dL — ABNORMAL LOW (ref 11.7–15.5)
MCH: 26.6 pg — AB (ref 27.0–33.0)
MCHC: 31.4 g/dL — AB (ref 32.0–36.0)
MCV: 84.7 fL (ref 80.0–100.0)
MPV: 9.7 fL (ref 7.5–12.5)
Platelets: 304 10*3/uL (ref 140–400)
RBC: 4.25 MIL/uL (ref 3.80–5.10)
RDW: 15.3 % — AB (ref 11.0–15.0)
WBC: 6.5 10*3/uL (ref 3.8–10.8)

## 2015-08-07 LAB — PROTIME-INR
INR: 0.92 (ref <1.50)
Prothrombin Time: 12.5 seconds (ref 11.6–15.2)

## 2015-08-07 NOTE — Assessment & Plan Note (Addendum)
Patient complains of progressive dyspnea on exertion. CPX suggests circulatory limit including pulmonary hypertension. She is noted to have coronary calcification on her CT as well. She also has diabetes mellitus. We will plan to proceed with definitive evaluation with right and left cardiac catheterization both to exclude coronary disease and to evaluate pulmonary pressures. The risks and benefits were discussed and she agrees to proceed. This includes myocardial infarction, CVA and death. Check renal function prior to procedure. Hold Glucophage for 48 hours afterwards. Add aspirin 81 mg daily prior to procedure.

## 2015-08-07 NOTE — Assessment & Plan Note (Signed)
Continue statin. 

## 2015-08-07 NOTE — Assessment & Plan Note (Signed)
Blood pressure controlled. Continue present medications. 

## 2015-08-07 NOTE — Patient Instructions (Signed)
Medication Instructions:   START ASPIRIN 81 MG ONCE DAILY  Labwork:  Your physician recommends that you HAVE LAB WORK TODAY  Testing/Procedures:  Your physician has requested that you have a cardiac catheterization. Cardiac catheterization is used to diagnose and/or treat various heart conditions. Doctors may recommend this procedure for a number of different reasons. The most common reason is to evaluate chest pain. Chest pain can be a symptom of coronary artery disease (CAD), and cardiac catheterization can show whether plaque is narrowing or blocking your heart's arteries. This procedure is also used to evaluate the valves, as well as measure the blood flow and oxygen levels in different parts of your heart. For further information please visit [email or web address]. Please follow instruction sheet, as given.    Coronary Angiogram A coronary angiogram, also called coronary angiography, is an X-ray procedure used to look at the arteries in the heart. In this procedure, a dye (contrast dye) is injected through a long, hollow tube (catheter). The catheter is about the size of a piece of cooked spaghetti and is inserted through your groin, wrist, or arm. The dye is injected into each artery, and X-rays are then taken to show if there is a blockage in the arteries of your heart. LET [facility] CARE PROVIDER KNOW ABOUT:  Any allergies you have, including allergies to shellfish or contrast dye.   All medicines you are taking, including vitamins, herbs, eye drops, creams, and over-the-counter medicines.   Previous problems you or members of your family have had with the use of anesthetics.   Any blood disorders you have.   Previous surgeries you have had.  History of kidney problems or failure.   Other medical conditions you have. RISKS AND COMPLICATIONS  Generally, a coronary angiogram is a safe procedure. However, problems can occur and include:  Allergic reaction to the  dye.  Bleeding from the access site or other locations.  Kidney injury, especially in people with impaired kidney function.  Stroke (rare).  Heart attack (rare). BEFORE THE PROCEDURE   Do not eat or drink anything after midnight the night before the procedure or as directed by your health care provider.   Ask your health care provider about changing or stopping your regular medicines. This is especially important if you are taking diabetes medicines or blood thinners. PROCEDURE  You may be given a medicine to help you relax (sedative) before the procedure. This medicine is given through an intravenous (IV) access tube that is inserted into one of your veins.   The area where the catheter will be inserted will be washed and shaved. This is usually done in the groin but may be done in the fold of your arm (near your elbow) or in the wrist.   A medicine will be given to numb the area where the catheter will be inserted (local anesthetic).   The health care provider will insert the catheter into an artery. The catheter will be guided by using a special type of X-ray (fluoroscopy) of the blood vessel being examined.   A special dye will then be injected into the catheter, and X-rays will be taken. The dye will help to show where any narrowing or blockages are located in the heart arteries.  AFTER THE PROCEDURE   If the procedure is done through the leg, you will be kept in bed lying flat for several hours. You will be instructed to not bend or cross your legs.  The insertion site will be checked frequently.  The pulse in your feet or wrist will be checked frequently.   Additional blood tests, X-rays, and an electrocardiogram may be done.    This information is not intended to replace advice given to you by your health care provider. Make sure you discuss any questions you have with your health care provider.   Document Released: 10/03/2002 Document Revised: 04/19/2014  Document Reviewed: 08/21/2012 Elsevier Interactive Patient Education [employer].

## 2015-08-13 ENCOUNTER — Encounter (HOSPITAL_COMMUNITY)
Admission: RE | Disposition: A | Discharge status: Home / Self Care | Payer: Self-pay | Source: Ambulatory Visit | Attending: Cardiology

## 2015-08-13 ENCOUNTER — Encounter (HOSPITAL_COMMUNITY): Payer: Self-pay | Admitting: Cardiology

## 2015-08-13 ENCOUNTER — Ambulatory Visit (HOSPITAL_COMMUNITY)
Admission: RE | Admit: 2015-08-13 | Discharge: 2015-08-13 | Disposition: A | Discharge status: Home / Self Care | Payer: Medicare Other | Source: Ambulatory Visit | Attending: Cardiology | Admitting: Cardiology

## 2015-08-13 DIAGNOSIS — I272 Other secondary pulmonary hypertension: Secondary | ICD-10-CM | POA: Insufficient documentation

## 2015-08-13 DIAGNOSIS — I2510 Atherosclerotic heart disease of native coronary artery without angina pectoris: Secondary | ICD-10-CM | POA: Insufficient documentation

## 2015-08-13 DIAGNOSIS — E119 Type 2 diabetes mellitus without complications: Secondary | ICD-10-CM | POA: Diagnosis not present

## 2015-08-13 DIAGNOSIS — I10 Essential (primary) hypertension: Secondary | ICD-10-CM | POA: Insufficient documentation

## 2015-08-13 DIAGNOSIS — Z880 Allergy status to penicillin: Secondary | ICD-10-CM | POA: Diagnosis not present

## 2015-08-13 DIAGNOSIS — R0600 Dyspnea, unspecified: Secondary | ICD-10-CM | POA: Diagnosis present

## 2015-08-13 DIAGNOSIS — E538 Deficiency of other specified B group vitamins: Secondary | ICD-10-CM | POA: Diagnosis not present

## 2015-08-13 DIAGNOSIS — E7800 Pure hypercholesterolemia, unspecified: Secondary | ICD-10-CM | POA: Diagnosis not present

## 2015-08-13 DIAGNOSIS — E559 Vitamin D deficiency, unspecified: Secondary | ICD-10-CM | POA: Diagnosis not present

## 2015-08-13 DIAGNOSIS — Z885 Allergy status to narcotic agent status: Secondary | ICD-10-CM | POA: Insufficient documentation

## 2015-08-13 DIAGNOSIS — I2584 Coronary atherosclerosis due to calcified coronary lesion: Secondary | ICD-10-CM | POA: Diagnosis not present

## 2015-08-13 DIAGNOSIS — Z7984 Long term (current) use of oral hypoglycemic drugs: Secondary | ICD-10-CM | POA: Diagnosis not present

## 2015-08-13 DIAGNOSIS — I480 Paroxysmal atrial fibrillation: Secondary | ICD-10-CM | POA: Insufficient documentation

## 2015-08-13 DIAGNOSIS — Z882 Allergy status to sulfonamides status: Secondary | ICD-10-CM | POA: Insufficient documentation

## 2015-08-13 DIAGNOSIS — R9439 Abnormal result of other cardiovascular function study: Secondary | ICD-10-CM | POA: Diagnosis present

## 2015-08-13 DIAGNOSIS — E782 Mixed hyperlipidemia: Secondary | ICD-10-CM | POA: Diagnosis present

## 2015-08-13 DIAGNOSIS — K219 Gastro-esophageal reflux disease without esophagitis: Secondary | ICD-10-CM | POA: Diagnosis not present

## 2015-08-13 DIAGNOSIS — Z8249 Family history of ischemic heart disease and other diseases of the circulatory system: Secondary | ICD-10-CM | POA: Diagnosis not present

## 2015-08-13 DIAGNOSIS — Z8551 Personal history of malignant neoplasm of bladder: Secondary | ICD-10-CM | POA: Diagnosis not present

## 2015-08-13 HISTORY — PX: CARDIAC CATHETERIZATION: SHX172

## 2015-08-13 LAB — POCT I-STAT 3, ART BLOOD GAS (G3+)
Acid-base deficit: 2 mmol/L (ref 0.0–2.0)
BICARBONATE: 25.8 meq/L — AB (ref 20.0–24.0)
Bicarbonate: 23.5 mEq/L (ref 20.0–24.0)
O2 Saturation: 86 %
O2 Saturation: 95 %
PCO2 ART: 43.8 mmHg (ref 35.0–45.0)
PH ART: 7.35 (ref 7.350–7.450)
PH ART: 7.378 (ref 7.350–7.450)
PO2 ART: 77 mmHg — AB (ref 80.0–100.0)
TCO2: 25 mmol/L (ref 0–100)
TCO2: 27 mmol/L (ref 0–100)
pCO2 arterial: 42.7 mmHg (ref 35.0–45.0)
pO2, Arterial: 54 mmHg — ABNORMAL LOW (ref 80.0–100.0)

## 2015-08-13 LAB — POCT I-STAT 3, VENOUS BLOOD GAS (G3P V)
ACID-BASE DEFICIT: 1 mmol/L (ref 0.0–2.0)
Bicarbonate: 24.1 mEq/L — ABNORMAL HIGH (ref 20.0–24.0)
O2 SAT: 65 %
TCO2: 25 mmol/L (ref 0–100)
pCO2, Ven: 43 mmHg — ABNORMAL LOW (ref 45.0–50.0)
pH, Ven: 7.357 — ABNORMAL HIGH (ref 7.250–7.300)
pO2, Ven: 35 mmHg (ref 31.0–45.0)

## 2015-08-13 LAB — GLUCOSE, CAPILLARY
GLUCOSE-CAPILLARY: 185 mg/dL — AB (ref 65–99)
GLUCOSE-CAPILLARY: 175 mg/dL — AB (ref 65–99)

## 2015-08-13 SURGERY — RIGHT/LEFT HEART CATH AND CORONARY ANGIOGRAPHY
Anesthesia: LOCAL

## 2015-08-13 MED ORDER — SODIUM CHLORIDE 0.9 % WEIGHT BASED INFUSION
3.0000 mL/kg/h | INTRAVENOUS | Status: AC
  Administered 2015-08-13: 3 mL/kg/h via INTRAVENOUS

## 2015-08-13 MED ORDER — SODIUM CHLORIDE 0.9 % WEIGHT BASED INFUSION: 3.0000 mL/kg/h | INTRAVENOUS | Status: DC

## 2015-08-13 MED ORDER — IOPAMIDOL (ISOVUE-370) INJECTION 76%
INTRAVENOUS | Status: DC | PRN
  Administered 2015-08-13: 75 mL via INTRA_ARTERIAL

## 2015-08-13 MED ORDER — SODIUM CHLORIDE 0.9 % WEIGHT BASED INFUSION: 1.0000 mL/kg/h | INTRAVENOUS | Status: DC

## 2015-08-13 MED ORDER — SODIUM CHLORIDE 0.9 % IV SOLN: 250.0000 mL | INTRAVENOUS | Status: DC | PRN

## 2015-08-13 MED ORDER — METFORMIN HCL 500 MG PO TABS
1000.0000 mg | ORAL_TABLET | Freq: Two times a day (BID) | ORAL | Status: DC
  Filled 2015-08-13: qty 2

## 2015-08-13 MED ORDER — MIDAZOLAM HCL 2 MG/2ML IJ SOLN
INTRAMUSCULAR | Status: AC
  Filled 2015-08-13: qty 2

## 2015-08-13 MED ORDER — LIDOCAINE HCL (PF) 1 % IJ SOLN
INTRAMUSCULAR | Status: DC | PRN
  Administered 2015-08-13 (×2): 2 mL via INTRADERMAL

## 2015-08-13 MED ORDER — SODIUM CHLORIDE 0.9% FLUSH: 3.0000 mL | INTRAVENOUS | Status: DC | PRN

## 2015-08-13 MED ORDER — HEPARIN (PORCINE) IN NACL 2-0.9 UNIT/ML-% IJ SOLN
INTRAMUSCULAR | Status: DC | PRN
  Administered 2015-08-13: 10 mL via INTRA_ARTERIAL

## 2015-08-13 MED ORDER — HEPARIN SODIUM (PORCINE) 1000 UNIT/ML IJ SOLN
INTRAMUSCULAR | Status: AC
  Filled 2015-08-13: qty 1

## 2015-08-13 MED ORDER — HEPARIN (PORCINE) IN NACL 2-0.9 UNIT/ML-% IJ SOLN
INTRAMUSCULAR | Status: AC
  Filled 2015-08-13: qty 1000

## 2015-08-13 MED ORDER — FENTANYL CITRATE (PF) 100 MCG/2ML IJ SOLN
INTRAMUSCULAR | Status: AC
  Filled 2015-08-13: qty 2

## 2015-08-13 MED ORDER — MIDAZOLAM HCL 2 MG/2ML IJ SOLN
INTRAMUSCULAR | Status: DC | PRN
  Administered 2015-08-13 (×2): 1 mg via INTRAVENOUS

## 2015-08-13 MED ORDER — FENTANYL CITRATE (PF) 100 MCG/2ML IJ SOLN
INTRAMUSCULAR | Status: DC | PRN
  Administered 2015-08-13 (×2): 25 ug via INTRAVENOUS

## 2015-08-13 MED ORDER — HEPARIN (PORCINE) IN NACL 2-0.9 UNIT/ML-% IJ SOLN
INTRAMUSCULAR | Status: DC | PRN
  Administered 2015-08-13: 1000 mL

## 2015-08-13 MED ORDER — SODIUM CHLORIDE 0.9% FLUSH: 3.0000 mL | Freq: Two times a day (BID) | INTRAVENOUS | Status: DC

## 2015-08-13 MED ORDER — LIDOCAINE HCL (PF) 1 % IJ SOLN
INTRAMUSCULAR | Status: AC
  Filled 2015-08-13: qty 30

## 2015-08-13 MED ORDER — NITROGLYCERIN 1 MG/10 ML FOR IR/CATH LAB
INTRA_ARTERIAL | Status: AC
  Filled 2015-08-13: qty 10

## 2015-08-13 MED ORDER — HEPARIN SODIUM (PORCINE) 1000 UNIT/ML IJ SOLN
INTRAMUSCULAR | Status: DC | PRN
  Administered 2015-08-13: 3000 [IU] via INTRAVENOUS

## 2015-08-13 MED ORDER — ASPIRIN 81 MG PO CHEW: 81.0000 mg | CHEWABLE_TABLET | ORAL | Status: DC

## 2015-08-13 MED ORDER — VERAPAMIL HCL 2.5 MG/ML IV SOLN
INTRAVENOUS | Status: AC
  Filled 2015-08-13: qty 2

## 2015-08-13 SURGICAL SUPPLY — 13 items
CATH BALLN WEDGE 5F 110CM (CATHETERS) ×2 IMPLANT
CATH INFINITI 5 FR JL3.5 (CATHETERS) ×2 IMPLANT
CATH INFINITI 5FR ANG PIGTAIL (CATHETERS) ×2 IMPLANT
CATH INFINITI JR4 5F (CATHETERS) ×2 IMPLANT
GLIDESHEATH SLEND SS 6F .021 (SHEATH) ×2 IMPLANT
KIT HEART LEFT (KITS) ×2 IMPLANT
PACK CARDIAC CATHETERIZATION (CUSTOM PROCEDURE TRAY) ×2 IMPLANT
SHEATH FAST CATH BRACH 5F 5CM (SHEATH) ×2 IMPLANT
SYR MEDRAD MARK V 150ML (SYRINGE) ×2 IMPLANT
TRANSDUCER W/STOPCOCK (MISCELLANEOUS) ×2 IMPLANT
TUBING CIL FLEX 10 FLL-RA (TUBING) ×2 IMPLANT
WIRE HI TORQ VERSACORE-J 145CM (WIRE) ×2 IMPLANT
WIRE SAFE-T 1.5MM-J .035X260CM (WIRE) ×2 IMPLANT

## 2015-08-13 NOTE — H&P (View-Only) (Signed)
HPI: 70 year old female for evaluation of dyspnea. Previously seen in this office but not since November 2013. Patient apparently had an episode of atrial fibrillation in the setting of urosepsis previously. Echocardiogram [place] 2012 showed normal LV systolic function and grade 1 diastolic dysfunction. Mild left atrial enlargement. Nuclear study December 2013 showed ejection fraction 73% and normal perfusion. Stress echocardiogram September 2016 [place] normal.  High-resolution CT November 2016 did not show interstitial lung disease. There was note of three-vessel coronary artery calcification. CPX March 2017 suggested functional limitation due to circulatory limitation. Findings suggestive of diastolic dysfunction and pulmonary hypertension. Patient states she has had dyspnea on exertion for over 6 months.It is slowly worsening. She does not have orthopnea, PND, pedal edema, palpitations, syncope or chest pain.  Current Outpatient Prescriptions  Medication Sig Dispense Refill  . glimepiride (AMARYL) 4 MG tablet Take 4 mg by mouth 2 (two) times daily.     . hydrALAZINE (APRESOLINE) 50 MG tablet Take 50 mg by mouth 2 (two) times daily.  3  . losartan (COZAAR) 100 MG tablet Take 100 mg by mouth daily.      . metFORMIN (GLUCOPHAGE) 1000 MG tablet Take 1,000 mg by mouth 2 (two) times daily with a meal.      . pravastatin (PRAVACHOL) 20 MG tablet Take 1 tablet by mouth Daily.     No current facility-administered medications for this visit.    Allergies  Allergen Reactions  . Codeine Shortness Of Breath    Throat Swelling  . Ranitidine Anaphylaxis  . Ace Inhibitors   . Advil [Ibuprofen]   . Aleve [Naproxen Sodium]   . Boniva [Ibandronic Acid]     Swelling   . Clonidine Derivatives   . Januvia [Sitagliptin Phosphate]   . Penicillins     Swelling   . Sulfa Antibiotics     Throat swells     Past Medical History  Diagnosis Date  . Osteoporosis, unspecified   . Pure  hypercholesterolemia   . Essential hypertension, benign   . Unspecified vitamin D deficiency   . Head injury, unspecified   . Type II or unspecified type diabetes mellitus without mention of complication, uncontrolled   . Paroxysmal atrial fibrillation (HCC)     during septic shock, none since  . B12 deficiency   . Closed head injury   . Memory difficulty     d/t head injury  . GERD (gastroesophageal reflux disease)   . Bladder cancer [facility])     Past Surgical History  Procedure Laterality Date  . Appendectomy  1981  . Tonsillectomy    . Total abdominal hysterectomy  1981  . Cardiac catheterization      Social History   Social History  . Marital Status: Married    Spouse Name: N/A  . Number of Children: 3  . Years of Education: N/A   Occupational History  . Not on file.   Social History Main Topics  . Smoking status: Never Smoker   . Smokeless tobacco: Not on file  . Alcohol Use: No  . Drug Use: No  . Sexual Activity: Not on file   Other Topics Concern  . Not on file   Social History Narrative   Married 3 sons   Current work status : Unemployed, not looking for work   No illicit drug use   Alcohol use : No alcohol use   Tobacco use: Never smoke  Family History  Problem Relation Age of Onset  . Coronary artery disease Mother     MI  . Heart disease Father     CHF  . Diabetes Sister   . Thyroid disease Sister   . Arthritis Sister   . Diabetes Sister   . Diabetes Sister     ROS: no fevers or chills, productive cough, hemoptysis, dysphasia, odynophagia, melena, hematochezia, dysuria, hematuria, rash, seizure activity, orthopnea, PND, pedal edema, claudication. Remaining systems are negative.  Physical Exam:   Blood pressure 140/60, pulse 71, height 5\' 4"  (1.626 m), weight 149 lb (67.586 kg).  General:  Well developed/well nourished in NAD Skin warm/dry Patient not depressed No peripheral clubbing Back-normal HEENT-normal/normal  eyelids Neck supple/normal carotid upstroke bilaterally; no bruits; no JVD; no thyromegaly chest - CTA/ normal expansion CV - RRR/normal S1 and S2; no murmurs, rubs or gallops;  PMI nondisplaced Abdomen -NT/ND, no HSM, no mass, + bowel sounds, no bruit 2+ femoral pulses, no bruits Ext-no edema, chords, 2+ DP Neuro-grossly nonfocal  ECG Normal sinus rhythm at a rate of 71. Nonspecific ST changes.

## 2015-08-13 NOTE — Interval H&P Note (Signed)
History and Physical Interval Note:  08/13/2015 9:31 AM  Ann Hunter  has presented today for surgery, with the diagnosis of dyspnea  The various methods of treatment have been discussed with the patient and family. After consideration of risks, benefits and other options for treatment, the patient has consented to  Procedure(s): Right/Left Heart Cath and Coronary Angiography (N/A) as a surgical intervention .  The patient's history has been reviewed, patient examined, no change in status, stable for surgery.  I have reviewed the patient's chart and labs.  Questions were answered to the patient's satisfaction.   Cath Lab Visit (complete for each Cath Lab visit)  Clinical Evaluation Leading to the Procedure:   ACS: No.  Non-ACS:    Anginal Classification: CCS II  Anti-ischemic medical therapy: Minimal Therapy (1 class of medications)  Non-Invasive Test Results: Intermediate-risk stress test findings: cardiac mortality 1-3%/year  Prior CABG: No previous CABG        [name] 08/13/2015 9:31 AM

## 2015-08-13 NOTE — Discharge Instructions (Signed)
HOLD METFORMIN FOR 48 HOURS. MAY RESUME SAT. MORNING. Radial Site Care Refer to this sheet in the next few weeks. These instructions provide you with information about caring for yourself after your procedure. Your health care provider may also give you more specific instructions. Your treatment has been planned according to current medical practices, but problems sometimes occur. Call your health care provider if you have any problems or questions after your procedure. WHAT TO EXPECT AFTER THE PROCEDURE After your procedure, it is typical to have the following:  Bruising at the radial site that usually fades within 1-2 weeks.  Blood collecting in the tissue (hematoma) that may be painful to the touch. It should usually decrease in size and tenderness within 1-2 weeks. HOME CARE INSTRUCTIONS  Take medicines only as directed by your health care provider.  You may shower 24-48 hours after the procedure or as directed by your health care provider. Remove the bandage (dressing) and gently wash the site with plain soap and water. Pat the area dry with a clean towel. Do not rub the site, because this may cause bleeding.  Do not take baths, swim, or use a hot tub until your health care provider approves.  Check your insertion site every day for redness, swelling, or drainage.  Do not apply powder or lotion to the site.  Do not flex or bend the affected arm for 24 hours or as directed by your health care provider.  Do not push or pull heavy objects with the affected arm for 24 hours or as directed by your health care provider.  Do not lift over 10 lb (4.5 kg) for 5 days after your procedure or as directed by your health care provider.  Ask your health care provider when it is okay to:  Return to work or school.  Resume usual physical activities or sports.  Resume sexual activity.  Do not drive home if you are discharged the same day as the procedure. Have someone else drive you.  You may  drive 24 hours after the procedure unless otherwise instructed by your health care provider.  Do not operate machinery or power tools for 24 hours after the procedure.  If your procedure was done as an outpatient procedure, which means that you went home the same day as your procedure, a responsible adult should be with you for the first 24 hours after you arrive home.  Keep all follow-up visits as directed by your health care provider. This is important. SEEK MEDICAL CARE IF:  You have a fever.  You have chills.  You have increased bleeding from the radial site. Hold pressure on the site. CALL 911 SEEK IMMEDIATE MEDICAL CARE IF:  You have unusual pain at the radial site.  You have redness, warmth, or swelling at the radial site.  You have drainage (other than a small amount of blood on the dressing) from the radial site.  The radial site is bleeding, and the bleeding does not stop after 30 minutes of holding steady pressure on the site.  Your arm or hand becomes pale, cool, tingly, or numb.   This information is not intended to replace advice given to you by your health care provider. Make sure you discuss any questions you have with your health care provider.   Document Released: 05/01/2010 Document Revised: 04/19/2014 Document Reviewed: 10/15/2013 Elsevier Interactive Patient Education [employer].

## 2015-08-13 NOTE — Progress Notes (Signed)
Site area:  Rt ac brachial venous sheath Site Prior to Removal:  Level 0 Pressure Applied For:  10 minutes Manual:   yes Patient Status During Pull:  stable Post Pull Site:  Level   0 Post Pull Instructions Given:  yes Post Pull Pulses Present:  yes Dressing Applied:  tegaderm Bedrest begins @  1050 Comments:

## 2015-08-26 ENCOUNTER — Telehealth: Payer: Self-pay | Admitting: Internal Medicine

## 2015-08-26 ENCOUNTER — Telehealth: Payer: Self-pay | Admitting: Cardiology

## 2015-08-26 NOTE — Telephone Encounter (Signed)
Spoke with pt husband, they are aware of the cath results but were told the next step would be up to [facility] and [facility]aswamy regarding her pulmonary htn. Currently they have no follow up and not heard anything and they are wondering what the next step is. They also want to know if the pt needs to continue the aspirin she was placed on prior to cath. Will forward for dr Stanford Breed review

## 2015-08-26 NOTE — Telephone Encounter (Signed)
Spoke with dr Golden Pop office, his next follow up is in July. Will forward to dr Dillard Essex to help advise with plan. Pt aware to follow up with pulmonary office if have not heard from them by next week.

## 2015-08-26 NOTE — Telephone Encounter (Signed)
Pt's husband is calling in wanting to find out the results from the Catheterization. Please f/u with the pt.   Thanks

## 2015-08-26 NOTE — Telephone Encounter (Signed)
Patient should FU with pulmonary for pulm hypertension and dyspnea. No CAD and normal filling pressure. Kirk Ruths

## 2015-08-28 ENCOUNTER — Telehealth: Payer: Self-pay | Admitting: Internal Medicine

## 2015-08-28 DIAGNOSIS — I272 Pulmonary hypertension, unspecified: Secondary | ICD-10-CM

## 2015-08-28 DIAGNOSIS — R0600 Dyspnea, unspecified: Secondary | ICD-10-CM

## 2015-08-28 NOTE — Telephone Encounter (Signed)
HIV tes is normal part of pulmonary hypertension workup but if epix medicare connection will not work then leave it for Dr Lake Bells to make an assessment on that at fu.  Given fact she is making tests tmorrow you should just go ahead and make appt with Dr Lake Bells - once done let me know and I will send a referral message to him  Thanks much  Dr. Brand Males, M.D., [facility].C.P Pulmonary and Critical Care Medicine Staff Physician [place] [facility] Pager: [phone], If no answer or between  15:00h - 7:00h: call [phone]  08/28/2015 4:26 PM

## 2015-08-28 NOTE — Telephone Encounter (Signed)
OK- will hold off on HIV testing  Still unsure when VQ scan will be done  [facility] for the pt to go ahead and make appt with BQ

## 2015-08-28 NOTE — Telephone Encounter (Signed)
Refe t Dr Lake Bells. However, before that  1. Do autoimmune - ANA, DS DNA, RF, CCP, SSA, SSB and scl-70 2. Do blood HIV - let her knowt his is routine for this disease process 3. Do VQ scan but write specificanlly indication is pulmonary hypertension and in instructions write to rule out chronic PE  She needs to finish all of above alteast 3-5 days before seeing [facility]  Dr. Brand Males, M.D., [facility].C.P Pulmonary and Critical Care Medicine Staff Physician [place] [facility] Pager: [phone], If no answer or between  15:00h - 7:00h: call [phone]  08/28/2015 3:39 PM

## 2015-08-28 NOTE — Telephone Encounter (Signed)
Spoke with pt's husband.  They are wanting to know what next step is since cardiac cath by Dr Stanford Breed showed Pulm Hypertension.  Dr Stanford Breed told pt to f/u with you to see how to proceed.  Please advise.

## 2015-08-28 NOTE — Telephone Encounter (Signed)
I have called and spoke with the pt  She is agreeable to have all of this testing done  I have placed orders for everything except HIV  and after results come in we can schedule appt with Fayne Mediate  Pt hopes to come in for labs tomorrow  MR- please advise on HIV test dx, I have tried everything and nothing is working Administrator, [employer]

## 2015-08-29 ENCOUNTER — Other Ambulatory Visit: Payer: Medicare Other

## 2015-08-29 DIAGNOSIS — R0600 Dyspnea, unspecified: Secondary | ICD-10-CM

## 2015-08-29 LAB — RHEUMATOID FACTOR: Rhuematoid fact SerPl-aCnc: <10 IU/mL (ref <=14)

## 2015-08-29 NOTE — Telephone Encounter (Signed)
Spoke with pt. She has been scheduled with BQ on 09/23/15 at 4:15pm. Nothing further was needed.

## 2015-08-29 NOTE — Telephone Encounter (Signed)
There isn't any documentation in this encounter. Message will be closed.

## 2015-09-01 LAB — ANTI-NUCLEAR AB-TITER (ANA TITER): ANA Titer 1: 1:40 {titer} — ABNORMAL HIGH

## 2015-09-01 LAB — CYCLIC CITRUL PEPTIDE ANTIBODY, IGG

## 2015-09-01 LAB — SJOGRENS SYNDROME-B EXTRACTABLE NUCLEAR ANTIBODY: SSB (La) (ENA) Antibody, IgG: <1

## 2015-09-01 LAB — SJOGRENS SYNDROME-A EXTRACTABLE NUCLEAR ANTIBODY: SSA (Ro) (ENA) Antibody, IgG: <1

## 2015-09-01 LAB — ANTI-SCLERODERMA ANTIBODY: Scleroderma (Scl-70) (ENA) Antibody, IgG: <1

## 2015-09-01 LAB — ANA: Anti Nuclear Antibody(ANA): POSITIVE — AB

## 2015-09-01 LAB — ANTI-DNA ANTIBODY, DOUBLE-STRANDED: ds DNA Ab: <1 IU/mL

## 2015-09-03 NOTE — Telephone Encounter (Signed)
Referred to in-house [facility] expert DR Lake Bells seeing him  Soon.No need to reply

## 2015-09-04 ENCOUNTER — Ambulatory Visit (HOSPITAL_COMMUNITY)
Admission: RE | Admit: 2015-09-04 | Discharge: 2015-09-04 | Disposition: A | Discharge status: Home / Self Care | Payer: Medicare Other | Source: Ambulatory Visit | Attending: Internal Medicine | Admitting: Internal Medicine

## 2015-09-04 DIAGNOSIS — R0600 Dyspnea, unspecified: Secondary | ICD-10-CM

## 2015-09-04 DIAGNOSIS — I272 Other secondary pulmonary hypertension: Secondary | ICD-10-CM | POA: Insufficient documentation

## 2015-09-04 MED ORDER — TECHNETIUM TC 99M DIETHYLENETRIAME-PENTAACETIC ACID: 29.8000 | Freq: Once | INTRAVENOUS | Status: DC | PRN

## 2015-09-04 MED ORDER — TECHNETIUM TO 99M ALBUMIN AGGREGATED
4.1000 | Freq: Once | INTRAVENOUS | Status: AC | PRN
  Administered 2015-09-04: 4 via INTRAVENOUS

## 2015-09-23 ENCOUNTER — Ambulatory Visit (INDEPENDENT_AMBULATORY_CARE_PROVIDER_SITE_OTHER): Payer: Medicare Other | Admitting: Pulmonary Disease

## 2015-09-23 ENCOUNTER — Other Ambulatory Visit (INDEPENDENT_AMBULATORY_CARE_PROVIDER_SITE_OTHER): Payer: Medicare Other

## 2015-09-23 ENCOUNTER — Encounter: Payer: Self-pay | Admitting: Pulmonary Disease

## 2015-09-23 DIAGNOSIS — R0989 Other specified symptoms and signs involving the circulatory and respiratory systems: Secondary | ICD-10-CM | POA: Diagnosis not present

## 2015-09-23 DIAGNOSIS — R6510 Systemic inflammatory response syndrome (SIRS) of non-infectious origin without acute organ dysfunction: Secondary | ICD-10-CM

## 2015-09-23 DIAGNOSIS — Z114 Encounter for screening for human immunodeficiency virus [HIV]: Secondary | ICD-10-CM

## 2015-09-23 DIAGNOSIS — Z0000 Encounter for general adult medical examination without abnormal findings: Secondary | ICD-10-CM

## 2015-09-23 DIAGNOSIS — R0600 Dyspnea, unspecified: Secondary | ICD-10-CM | POA: Diagnosis not present

## 2015-09-23 DIAGNOSIS — Z5181 Encounter for therapeutic drug level monitoring: Secondary | ICD-10-CM

## 2015-09-23 DIAGNOSIS — J984 Other disorders of lung: Secondary | ICD-10-CM

## 2015-09-23 DIAGNOSIS — R9439 Abnormal result of other cardiovascular function study: Secondary | ICD-10-CM | POA: Diagnosis not present

## 2015-09-23 DIAGNOSIS — I272 Pulmonary hypertension, unspecified: Secondary | ICD-10-CM | POA: Insufficient documentation

## 2015-09-23 NOTE — Progress Notes (Signed)
Subjective:    Patient ID: Ann [facility], female    DOB: 1945-10-13, 70 y.o.   MRN: [id number]  Synopsis: referred by Dr. Guadalupe Hunter in 2017 for pulmonary hypertension.  HPI Chief Complaint  Patient presents with  . Follow-up    rov per MR for pulmonary htn. Pt c/o sob with exertion.      Ann Hunter started having shortness of breath several years ago and it has slowly progressed since then.  She says she thinks that her symptoms started around 2012 after she was critically ill and on mechanical ventilation briefly.  Her symptoms improved initially after hostpial discharge but she started feeling dyspneic again about a year or two ago.  Dyspnea: > has been progressing over the last year > worse with bending over > worse with carrying things, lifting heavy objects > carrying groceries is difficult  She feels that her muscles are getting weaker, but she feels like she is just not using them enough.  Dr. Chase Hunter has referred her to pulmonary hypertension.    She reports that she never smoked, never worked with chemicals or fumes, etc.  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  No family history of lung problems.  She was born prematurely and reports having pneumonia as a baby.    She never has had thyroid problems. She she denies taking diet pills but she does recall taking injections of a "sterile horse urine" for a month in the 1970's (HCG?). This was part of a weight loss center that was ultimately shut down by the state medical board.  She doesn't believe that she received a medication with this.  She has    Past Medical History  Diagnosis Date  . Osteoporosis, unspecified   . Pure hypercholesterolemia   . Essential hypertension, benign   . Unspecified vitamin D deficiency   . Head injury, unspecified   . Type II or unspecified type diabetes mellitus without mention of complication, uncontrolled   . Paroxysmal atrial fibrillation (HCC)     during septic shock, none since  . B12  deficiency   . Closed head injury   . Memory difficulty     d/t head injury  . GERD (gastroesophageal reflux disease)   . Bladder cancer ([place])       Review of Systems  Constitutional: Negative for fever, chills and fatigue.  HENT: Negative for rhinorrhea, sinus pressure and sneezing.   Respiratory: Positive for shortness of breath. Negative for cough and wheezing.   Cardiovascular: Negative for chest pain, palpitations and leg swelling.       Objective:   Physical Exam  Filed Vitals:   09/23/15 1609  BP: 138/82  Pulse: 64  Height: 5\' 4"  (1.626 m)  Weight: 149 lb 3.2 oz (67.677 kg)  SpO2: 95%  RA  Gen: well appearing, no acute distress HENT: NCAT, OP clear, neck supple without masses Eyes: PERRL, EOMi Lymph: no cervical lymphadenopathy PULM: CTA B but limited excursion CV: RRR, physiologic split S2, notable JVD GI: BS+, soft, nontender, no hsm Derm: no rash or skin breakdown MSK: normal bulk and tone Neuro: A&Ox4, CN II-XII intact, strength 5/5 in all 4 extremities Psyche: normal mood and affect    02/2015 HRCT > personally reviewed, mild atelectasis in the bases, no ILD; mosaicism noted  December 2016 pulmonary function testing ratio 77%, FEV1 1.67 L, FVC 2.17 L (73% predicted), total lung capacity 0.34 L (66% protected), ERV 10% predicted DLCO 14.64 (60% predicted).  May 2017 right heart catheterization right  atrial mean pressure 2 mmHg, right ventricular systolic pressure 56, pulmonary artery mean pressure 42 mmHg, pulmonary capillary wedge pressure mean 13 mmHg, left ventricular end-diastolic pressure 14.  No coronary artery disease.  Blood work performed by Dr. Chase Hunter in May 2017 shows a slightly elevated antineutrophilic antibody panel at 1-40, CCP negative, double-stranded DNA negative, rheumatoid factor negative, SSA and SSB both negative. Scleroderma antibody negative.  09/04/2015 VQ scan no evidence of pulmonary embolism.     Assessment & Plan:    Pulmonary hypertension ([place]) [name] has pulmonary hypertension based on her right heart catheterization from earlier this year. She's had a very good workup up until this point and I cannot find evidence of an underlying lung disease. However, to complete the workup I think that she needs to have a test of respiratory muscle strength, see discussion below, polysomnogram to screen for obstructive sleep apnea, TSH, HIV testing.  Assuming all of those are negative then she would have a diagnosis of World Health Organization class I pulmonary hypertension. These patients do well with early combination therapy.  Plan: TSH HIV Liver function testing Polysomnogram See below If all blood work negative then start Letairis 5 mg daily with plans to start Adcirca in one month.  Restrictive lung disease She had restrictive lung disease seen on her lung function testing from December without clear evidence of an underlying parenchymal problem. I have personally the images from her CT chest and I cannot appreciate an underlying interstitial lung disease.  Given the restriction with lack of pulmonary parenchymal or obvious pleural disease, we need to evaluate for neuromuscular weakness.  Plan: MIP/MEP Follow serial spirometry testing     Current outpatient prescriptions:  .  glimepiride (AMARYL) 4 MG tablet, Take 4 mg by mouth 2 (two) times daily. , Disp: , Rfl:  .  hydrALAZINE (APRESOLINE) 50 MG tablet, Take 50 mg by mouth 2 (two) times daily., Disp: , Rfl: 3 .  losartan (COZAAR) 100 MG tablet, Take 100 mg by mouth daily.  , Disp: , Rfl:  .  pravastatin (PRAVACHOL) 20 MG tablet, Take 20 mg by mouth daily. , Disp: , Rfl:

## 2015-09-23 NOTE — Assessment & Plan Note (Signed)
She had restrictive lung disease seen on her lung function testing from December without clear evidence of an underlying parenchymal problem. I have personally the images from her CT chest and I cannot appreciate an underlying interstitial lung disease.  Given the restriction with lack of pulmonary parenchymal or obvious pleural disease, we need to evaluate for neuromuscular weakness.  Plan: MIP/MEP Follow serial spirometry testing

## 2015-09-23 NOTE — Addendum Note (Signed)
Addended by: Len Blalock on: 09/23/2015 05:26 PM   Modules accepted: Orders

## 2015-09-23 NOTE — Patient Instructions (Signed)
We are going to order a workup that includes the following for your shortness of breath and pulmonary hypertension: HIV test TSH (thyroid test) Liver function testing Overnight sleep study MIP/MEP (pulmonary "strength" test)  Keep participating in pulmonary rehabilitation  Assuming your blood work is OK, we are going to start you on a medication called Letairis (Ambrisentan) or your pulmonary hypertension.  We will start the medication at 5 mg daily and if you are doing well we will increase it to 10 mg daily.  We will not ensure your liver function tests every 2 weeks for the first month followed by monthly testing for the first 3 months. If those tests are okay check your liver function tests every 3 months as long as you take the medication.  You should not take this medication if you are pregnant.  Follow up with [place] in 2 weeks

## 2015-09-23 NOTE — Assessment & Plan Note (Signed)
Ann Hunter has pulmonary hypertension based on her right heart catheterization from earlier this year. She's had a very good workup up until this point and I cannot find evidence of an underlying lung disease. However, to complete the workup I think that she needs to have a test of respiratory muscle strength, see discussion below, polysomnogram to screen for obstructive sleep apnea, TSH, HIV testing.  Assuming all of those are negative then she would have a diagnosis of World Health Organization class I pulmonary hypertension. These patients do well with early combination therapy.  Plan: TSH HIV Liver function testing Polysomnogram See below If all blood work negative then start Letairis 5 mg daily with plans to start Adcirca in one month.

## 2015-09-24 LAB — HEPATIC FUNCTION PANEL
ALBUMIN: 4.1 g/dL (ref 3.5–5.2)
ALT: 11 U/L (ref 0–35)
AST: 14 U/L (ref 0–37)
Alkaline Phosphatase: 37 U/L — ABNORMAL LOW (ref 39–117)
BILIRUBIN DIRECT: 0 mg/dL (ref 0.0–0.3)
BILIRUBIN TOTAL: 0.3 mg/dL (ref 0.2–1.2)
TOTAL PROTEIN: 6.8 g/dL (ref 6.0–8.3)

## 2015-09-24 LAB — TSH: TSH: 1.27 u[IU]/mL (ref 0.35–4.50)

## 2015-09-24 LAB — HIV ANTIBODY (ROUTINE TESTING W REFLEX): HIV: NONREACTIVE

## 2015-10-03 ENCOUNTER — Ambulatory Visit (HOSPITAL_COMMUNITY)
Admission: RE | Admit: 2015-10-03 | Discharge: 2015-10-03 | Disposition: A | Discharge status: Home / Self Care | Payer: Medicare Other | Source: Ambulatory Visit | Attending: Pulmonary Disease | Admitting: Pulmonary Disease

## 2015-10-03 DIAGNOSIS — Z029 Encounter for administrative examinations, unspecified: Secondary | ICD-10-CM | POA: Insufficient documentation

## 2015-10-03 DIAGNOSIS — J984 Other disorders of lung: Secondary | ICD-10-CM

## 2015-10-13 ENCOUNTER — Other Ambulatory Visit: Payer: Self-pay | Admitting: Pulmonary Disease

## 2015-10-13 DIAGNOSIS — R29898 Other symptoms and signs involving the musculoskeletal system: Secondary | ICD-10-CM

## 2015-10-15 ENCOUNTER — Ambulatory Visit (INDEPENDENT_AMBULATORY_CARE_PROVIDER_SITE_OTHER): Payer: Medicare Other | Admitting: Pulmonary Disease

## 2015-10-15 ENCOUNTER — Other Ambulatory Visit: Payer: Self-pay | Admitting: Pulmonary Disease

## 2015-10-15 ENCOUNTER — Encounter: Payer: Self-pay | Admitting: Pulmonary Disease

## 2015-10-15 ENCOUNTER — Other Ambulatory Visit: Payer: Medicare Other

## 2015-10-15 VITALS — BP 152/60 | HR 65 | Ht 64.0 in | Wt 150.8 lb

## 2015-10-15 DIAGNOSIS — I272 Other secondary pulmonary hypertension: Secondary | ICD-10-CM

## 2015-10-15 DIAGNOSIS — J984 Other disorders of lung: Secondary | ICD-10-CM | POA: Diagnosis not present

## 2015-10-15 DIAGNOSIS — Z114 Encounter for screening for human immunodeficiency virus [HIV]: Secondary | ICD-10-CM

## 2015-10-15 NOTE — Patient Instructions (Signed)
We will see you back after the sleep study and neurology evaluation in August

## 2015-10-15 NOTE — Assessment & Plan Note (Signed)
So far we are waiting for the work up for her condition to be completed.  As of now, we are awaiting the following: Sleep study Neurology evaluation for possible restrictive lung disease HIV testing  Plan: Await the results of these tests, if they reveal no evidence of a concomitant disease then we will start Letairis Follow-up late August

## 2015-10-15 NOTE — Assessment & Plan Note (Signed)
Respiratory muscle strength testing revealed possibility of respiratory muscle weakness which would be an explanation for her restrictive lung disease.  Plan: Neurology evaluation  > 50% of this 30 minute visit spent explaining things to her and her husband

## 2015-10-15 NOTE — Progress Notes (Signed)
Subjective:    Patient ID: Ann [facility], female    DOB: 1945/07/27, 70 y.o.   MRN: [id number]  Synopsis: referred by Dr. Guadalupe Dawn in 2017 for pulmonary hypertension.  02/2015 HRCT > personally reviewed, mild atelectasis in the bases, no ILD; mosaicism noted  December 2016 pulmonary function testing ratio 77%, FEV1 1.67 L, FVC 2.17 L (73% predicted), total lung capacity 0.34 L (66% protected), ERV 10% predicted DLCO 14.64 (60% predicted).  May 2017 right heart catheterization right atrial mean pressure 2 mmHg, right ventricular systolic pressure 56, pulmonary artery mean pressure 42 mmHg, pulmonary capillary wedge pressure mean 13 mmHg, left ventricular end-diastolic pressure 14.  No coronary artery disease.  Blood work performed by Dr. Chase Caller in May 2017 shows a slightly elevated antineutrophilic antibody panel at 1-40, CCP negative, double-stranded DNA negative, rheumatoid factor negative, SSA and SSB both negative. Scleroderma antibody negative.  09/04/2015 VQ scan no evidence of pulmonary embolism.  June 2017 respiratory muscle strength testing: MIP 49 cm H20 (37% pred), MEP -35 cm H20, 53% pred   HPI Chief Complaint  Patient presents with  . Follow-up    review labs and PFT.  Pt scheduled for NPSG on 11/14/15.     [name] says she has good days and bad days, sometimes the dyspnea is a little worse when she bends over to pick up something.  Specifically getting something out of a lower cabinet will make her short of breath for a few seconds.  This happens pretty consistently. She does have some exertional dyspnea with walking on level ground.    No new leg swelling.  She feels a lot of fatigue with any trip outside the house.    She is accompanied by her husband today.   Past Medical History  Diagnosis Date  . Osteoporosis, unspecified   . Pure hypercholesterolemia   . Essential hypertension, benign   . Unspecified vitamin D deficiency   . Head injury, unspecified   .  Type II or unspecified type diabetes mellitus without mention of complication, uncontrolled   . Paroxysmal atrial fibrillation (HCC)     during septic shock, none since  . B12 deficiency   . Closed head injury   . Memory difficulty     d/t head injury  . GERD (gastroesophageal reflux disease)   . Bladder cancer ([place])       Review of Systems  Constitutional: Negative for fever, chills and fatigue.  HENT: Negative for rhinorrhea, sinus pressure and sneezing.   Respiratory: Positive for shortness of breath. Negative for cough and wheezing.   Cardiovascular: Negative for chest pain, palpitations and leg swelling.       Objective:   Physical Exam  Filed Vitals:   10/15/15 1332  BP: 152/60  Pulse: 65  Height: 5\' 4"  (1.626 m)  Weight: 150 lb 12.8 oz (68.402 kg)  SpO2: 96%  RA  Gen: well appearing HENT: OP clear, TM's clear, neck supple PULM: CTA B, normal percussion CV: RRR, no mgr, trace edema, JVD noted GI: BS+, soft, nontender Derm: no cyanosis or rash Psyche: normal mood and affect         Assessment & Plan:  Pulmonary hypertension (HCC) So far we are waiting for the work up for her condition to be completed.  As of now, we are awaiting the following: Sleep study Neurology evaluation for possible restrictive lung disease HIV testing  Plan: Await the results of these tests, if they reveal no evidence of a concomitant disease then  we will start Letairis Follow-up late August  Restrictive lung disease Respiratory muscle strength testing revealed possibility of respiratory muscle weakness which would be an explanation for her restrictive lung disease.  Plan: Neurology evaluation  > 50% of this 30 minute visit spent explaining things to her and her husband     Current outpatient prescriptions:  .  glimepiride (AMARYL) 4 MG tablet, Take 4 mg by mouth 2 (two) times daily. , Disp: , Rfl:  .  hydrALAZINE (APRESOLINE) 50 MG tablet, Take 50 mg by mouth 2  (two) times daily., Disp: , Rfl: 3 .  losartan (COZAAR) 100 MG tablet, Take 100 mg by mouth daily.  , Disp: , Rfl:  .  metFORMIN (GLUCOPHAGE) 1000 MG tablet, Take 1,000 mg by mouth 2 (two) times daily with a meal., Disp: , Rfl:  .  pravastatin (PRAVACHOL) 20 MG tablet, Take 20 mg by mouth daily. , Disp: , Rfl:

## 2015-10-16 LAB — HIV ANTIBODY (ROUTINE TESTING W REFLEX): HIV: NONREACTIVE

## 2015-10-27 ENCOUNTER — Ambulatory Visit (HOSPITAL_BASED_OUTPATIENT_CLINIC_OR_DEPARTMENT_OTHER): Payer: Medicare Other | Attending: Pulmonary Disease | Admitting: Pulmonary Disease

## 2015-10-27 VITALS — Ht 64.5 in | Wt 148.0 lb

## 2015-10-27 DIAGNOSIS — J984 Other disorders of lung: Secondary | ICD-10-CM

## 2015-10-27 DIAGNOSIS — I4891 Unspecified atrial fibrillation: Secondary | ICD-10-CM | POA: Insufficient documentation

## 2015-10-27 DIAGNOSIS — Z7984 Long term (current) use of oral hypoglycemic drugs: Secondary | ICD-10-CM | POA: Diagnosis not present

## 2015-10-27 DIAGNOSIS — G4734 Idiopathic sleep related nonobstructive alveolar hypoventilation: Secondary | ICD-10-CM | POA: Diagnosis not present

## 2015-10-27 DIAGNOSIS — Z79899 Other long term (current) drug therapy: Secondary | ICD-10-CM | POA: Insufficient documentation

## 2015-10-27 DIAGNOSIS — I272 Other secondary pulmonary hypertension: Secondary | ICD-10-CM | POA: Insufficient documentation

## 2015-10-27 DIAGNOSIS — R0683 Snoring: Secondary | ICD-10-CM | POA: Diagnosis not present

## 2015-11-12 DIAGNOSIS — I272 Other secondary pulmonary hypertension: Secondary | ICD-10-CM | POA: Diagnosis not present

## 2015-11-12 DIAGNOSIS — J984 Other disorders of lung: Secondary | ICD-10-CM | POA: Diagnosis not present

## 2015-11-12 DIAGNOSIS — G4734 Idiopathic sleep related nonobstructive alveolar hypoventilation: Secondary | ICD-10-CM

## 2015-11-12 NOTE — Procedures (Signed)
    Patient Name: Ann Hunter, Ann Hunter Date: 10/27/2015 Gender: Female D.O.B: 11-10-1945 Age (years): 33 Referring Provider: Simonne Maffucci Height (inches): 66 Interpreting Physician: Chesley Mires MD, ABSM Weight (lbs): 148 RPSGT: Madelon Lips BMI: 25 MRN: [id number] Neck Size: 14.00  CLINICAL INFORMATION Sleep Study Type: NPSG   Indication for sleep study: 71 yr old female with pulmonary hypertension.  She presents for evaluation of sleep disordered breathing as contributor to pulmonary hypertension.   Epworth Sleepiness Score: 3   SLEEP STUDY TECHNIQUE As per the AASM Manual for the Scoring of Sleep and Associated Events v2.3 (April 2016) with a hypopnea requiring 4% desaturations. The channels recorded and monitored were frontal, central and occipital EEG, electrooculogram (EOG), submentalis EMG (chin), nasal and oral airflow, thoracic and abdominal wall motion, anterior tibialis EMG, snore microphone, electrocardiogram, and pulse oximetry.  MEDICATIONS Patient's medications include: GLIMEPIRIDE, HYDRALAZINE, METFORMIN, PRAVASTATIN. Medications self-administered by patient during sleep study : No sleep medicine administered.  SLEEP ARCHITECTURE The study was initiated at 10:51:17 PM and ended at 4:54:23 AM. Sleep onset time was 29.5 minutes and the sleep efficiency was 50.3%. The total sleep time was 182.5 minutes. Stage REM latency was N/A minutes. The patient spent 13.70% of the night in stage N1 sleep, 71.51% in stage N2 sleep, 14.79% in stage N3 and 0.00% in REM. Alpha intrusion was absent. Supine sleep was 13.22%.  RESPIRATORY PARAMETERS The overall apnea/hypopnea index (AHI) was 0.7 per hour. There were 0 total apneas, including 0 obstructive, 0 central and 0 mixed apneas. There were 2 hypopneas and 6 RERAs. The AHI during Stage REM sleep was N/A per hour. AHI while supine was 2.5 per hour. The mean oxygen saturation was 88.44%. The minimum SpO2 during sleep was  79.00%. Moderate snoring was noted during this study.  CARDIAC DATA The 2 lead EKG demonstrated sinus rhythm. The mean heart rate was 77.54 beats per minute. Other EKG findings include: Atrial Fibrillation.  LEG MOVEMENT DATA The total PLMS were 52 with a resulting PLMS index of 17.10. Associated arousal with leg movement index was 2.3 .  IMPRESSIONS - No significant obstructive sleep apnea occurred during this study (AHI = 0.7/h). - No significant central sleep apnea occurred during this study (CAI = 0.0/h). - Moderate oxygen desaturation was noted during this study (Min O2 = 79.00%).  She spent 79.4 minutes of test time with an SpO2 < 88%.  The study was performed without the use of supplemental oxygen. - The patient snored with Moderate snoring volume. - EKG findings include Atrial Fibrillation. - Mild periodic limb movements of sleep occurred during the study. No significant associated arousals.  DIAGNOSIS - Nocturnal Hypoxemia (327.26 [G47.36 ICD-10])  RECOMMENDATIONS - She should be evaluated for nocturnal oxygen therapy.  [Electronically signed] 11/12/2015 01:01 PM  Chesley Mires MD, [place], American Board of Sleep Medicine   NPI: [id number]

## 2015-11-13 ENCOUNTER — Telehealth: Payer: Self-pay

## 2015-11-13 DIAGNOSIS — R0902 Hypoxemia: Secondary | ICD-10-CM

## 2015-11-13 DIAGNOSIS — I4891 Unspecified atrial fibrillation: Secondary | ICD-10-CM

## 2015-11-13 NOTE — Telephone Encounter (Signed)
-----   Message from Juanito Doom, MD sent at 11/12/2015  8:13 PM EDT ----- A, Her polysomnogram showed: 1) No sleep apnea, good 2) Afib, not good, needs to see cardiology for this 3) Nocturnal hypoxemia, not good, please find out if she sleeps with oxygen, if not she needs 2L O2 and a repeat ONO. Thanks B

## 2015-11-13 NOTE — Addendum Note (Signed)
Addended by: Len Blalock on: 11/13/2015 12:25 PM   Modules accepted: Orders

## 2015-11-13 NOTE — Telephone Encounter (Signed)
Spoke with pt, aware of recs.  Cards referral and ono on 2lpm qhs ordered.  Nothing further needed.

## 2015-11-14 ENCOUNTER — Encounter (HOSPITAL_BASED_OUTPATIENT_CLINIC_OR_DEPARTMENT_OTHER): Payer: Medicare Other

## 2015-11-17 ENCOUNTER — Telehealth: Payer: Self-pay | Admitting: Pulmonary Disease

## 2015-11-17 NOTE — Telephone Encounter (Signed)
Called and spoke to [place] with [facility]. A "One Step" form has been faxed for BQ to sign for pt's O2. Form is not in BQ's look-ats, Claiborne Billings is to re-fax form to front fax to Ashley's attention. Kelly aware form will be faxed once received.   Will send message to Caryl Pina to follow.

## 2015-11-18 NOTE — Telephone Encounter (Signed)
Form has been received and is in BQ's look-at folder.  Will hold until this is returned back to me.

## 2015-11-24 NOTE — Telephone Encounter (Signed)
[facility] representative came by [place] office on 11/21/15 with form in hand. Rexene Edison, NP, signed form on 11/21/2015 in BQ's absence as pt was waiting on O2 until form was signed. Nothing further needed at this time.

## 2015-11-24 NOTE — Telephone Encounter (Signed)
Forms signed and faxed back.  Nothing further needed.

## 2015-11-28 ENCOUNTER — Encounter: Payer: Self-pay | Admitting: Neurology

## 2015-11-28 ENCOUNTER — Other Ambulatory Visit (INDEPENDENT_AMBULATORY_CARE_PROVIDER_SITE_OTHER): Payer: Medicare Other

## 2015-11-28 ENCOUNTER — Ambulatory Visit (INDEPENDENT_AMBULATORY_CARE_PROVIDER_SITE_OTHER): Payer: Medicare Other | Admitting: Neurology

## 2015-11-28 VITALS — BP 130/64 | HR 68 | Ht 64.0 in | Wt 148.6 lb

## 2015-11-28 DIAGNOSIS — M6281 Muscle weakness (generalized): Secondary | ICD-10-CM

## 2015-11-28 DIAGNOSIS — R0602 Shortness of breath: Secondary | ICD-10-CM

## 2015-11-28 LAB — CK: CK TOTAL: 47 U/L (ref 7–177)

## 2015-11-28 NOTE — Progress Notes (Signed)
Ann Hunter   Date: 11/28/15  Ann Hunter MRN: [id number] DOB: January 20, 1946   Dear Dr. Lake Bells:  Thank you for your kind referral of Ann Hunter for consultation of respiratory weakness. Although her history is well known to you, please allow [place] to reiterate it for the purpose of our medical record. The patient was accompanied to the clinic by self.    History of Present Illness: Ann Hunter is a 70 y.o. left-handed Caucasian female with history of bladder cancer, diabetes mellitus, hypertension, and hyperlipidemia presenting for evaluation of respiratory muscle weakness.    Starting about 5 years ago, following hospitalization for urosepsis which required brief ventilatory support.  Symptoms have slowly been progressive since this time.  It is worse with truncal flexion, such as reaching for a something in a cabinet.  She does not get short of breath with exertion, stating, that she is able to do her household chores without difficulty and able to go to the grocery store without having to rest.  She walks independently and has not had any falls.  She denies orthopnea and sleeps on two pillows for many years.  She denies dysphagia, dysarthria, vision changes, limb weakness, muscle twitches, cramps, or dark colored urine.  Her pulmonary function studies from December showed pattern consistent with restrictive lung disease, but there was no evidence of interstitial lung disease. In June, she had respiratory muscle testing which showed MIP 49 cm H20 and MEP -65 mc H20.   Therefore, she was referred for evaluation of possible neuromuscular weakness.  Recent sleep study did not show sleep apnea, but there was moderate oxygen desaturation (79 min of test time < 88%) and EKG showed new findings of atrial fibrillation.    No family history of neuromuscular disease.   She complains of left 4th and 5th digit tingling which is triggered by  elbow flexion, such as when she rests her elbow on her recliner to watch TV.  Out-side paper records, electronic medical record, and images have been reviewed where available and summarized as:  Labs 08/29/2015:  ANA 1:40 (homogeneous), Scl-70 neg, TSH 1.27, SSA/B neg, CCP <16, dsDNA <1, RF neg,  Labs 10/15/2015:  HIV neg  CT chest 02/17/2015:  1. Image quality is degraded by respiratory motion. No definitive evidence of interstitial lung disease.  2. Three-vessel coronary artery calcification.  December 2016 pulmonary function testing ratio 77%, FEV1 1.67 L, FVC 2.17 L (73% predicted), total lung capacity 0.34 L (66% protected), ERV 10% predicted DLCO 14.64 (60% predicted).  Past Medical History:  Diagnosis Date  . B12 deficiency   . Bladder cancer ([place])   . Closed head injury   . Essential hypertension, benign   . GERD (gastroesophageal reflux disease)   . Head injury, unspecified   . Memory difficulty    d/t head injury  . Osteoporosis, unspecified   . Paroxysmal atrial fibrillation ([facility])    during septic shock, none since  . Pure hypercholesterolemia   . Type II or unspecified type diabetes mellitus without mention of complication, uncontrolled   . Unspecified vitamin D deficiency     Past Surgical History:  Procedure Laterality Date  . APPENDECTOMY  1981  . CARDIAC CATHETERIZATION    . CARDIAC CATHETERIZATION N/A 08/13/2015   Procedure: Right/Left Heart Cath and Coronary Angiography;  Surgeon: Peter M Martinique, MD;  Location: [facility];  Service: Cardiovascular;  Laterality: N/A;  . TONSILLECTOMY    . TOTAL ABDOMINAL  HYSTERECTOMY  1981     Medications:  Outpatient Encounter Prescriptions as of 11/28/2015  Medication Sig Note  . carvedilol (COREG) 25 MG tablet TAKE 1 TABLET TWICE A DAY FOR 90 DAY(S) 11/28/2015: Received from: External Pharmacy  . glimepiride (AMARYL) 4 MG tablet Take 4 mg by mouth 2 (two) times daily.    . hydrALAZINE (APRESOLINE) 50 MG tablet Take 50  mg by mouth 2 (two) times daily.   Marland Kitchen losartan (COZAAR) 100 MG tablet Take 100 mg by mouth daily.     . metFORMIN (GLUCOPHAGE) 1000 MG tablet Take 1,000 mg by mouth 2 (two) times daily with a meal.   . pravastatin (PRAVACHOL) 20 MG tablet Take 20 mg by mouth daily.     No facility-administered encounter medications on file as of 11/28/2015.      Allergies:  Allergies  Allergen Reactions  . Advil [Ibuprofen] Swelling  . Codeine Shortness Of Breath    Throat Swelling  . Ranitidine Anaphylaxis  . Sitagliptin Anaphylaxis and Swelling  . Ace Inhibitors   . Aleve [Naproxen Sodium]   . Boniva [Ibandronic Acid] Diarrhea    Swelling   . Clonidine Derivatives   . Januvia [Sitagliptin Phosphate]   . Penicillins Swelling    Has patient had a PCN reaction causing immediate rash, facial/tongue/throat swelling, SOB or lightheadedness with hypotension: Yes Has patient had a PCN reaction causing severe rash involving mucus membranes or skin necrosis: No Has patient had a PCN reaction that required hospitalization No Has patient had a PCN reaction occurring within the last 10 years: No If all of the above answers are "NO", then may proceed with Cephalosporin use.    . Sulfa Antibiotics     Throat swells    Family History: Family History  Problem Relation Age of Onset  . Coronary artery disease Mother     MI  . Heart disease Father     CHF  . Diabetes Sister   . Diabetes Sister   . Thyroid disease Sister   . Arthritis Sister   . Diabetes Sister     Social History: Social History  Substance Use Topics  . Smoking status: Never Smoker  . Smokeless tobacco: Never Used  . Alcohol use No   Social History   Social History Narrative   Married 3 sons   Current work status : retired   No illicit drug use   Alcohol use : No alcohol use   Tobacco use: Never smoke   Education: college                Review of Systems:  CONSTITUTIONAL: No fevers, chills, night sweats, or weight  loss.   EYES: No visual changes or eye pain ENT: No hearing changes.  No history of nose bleeds.   RESPIRATORY: No cough, wheezing +shortness of breath.   CARDIOVASCULAR: Negative for chest pain, and palpitations.   GI: Negative for abdominal discomfort, blood in stools or black stools.  No recent change in bowel habits.   GU:  No history of incontinence.   MUSCLOSKELETAL: No history of joint pain or swelling.  No myalgias.   SKIN: Negative for lesions, rash, and itching.   HEMATOLOGY/ONCOLOGY: Negative for prolonged bleeding, bruising easily, and swollen nodes.  No history of cancer.   ENDOCRINE: Negative for cold or heat intolerance, polydipsia or goiter.   PSYCH:  No depression +anxiety symptoms.   NEURO: As Above.   Vital Signs:  BP 130/64   Pulse 68  Ht 5\' 4"  (1.626 m)   Wt 148 lb 9 oz (67.4 kg)   BMI 25.50 kg/m    General Medical Exam:   General:  Well appearing, comfortable.   Eyes/ENT: see cranial nerve examination.   Neck: No masses appreciated.  Full range of motion without tenderness.  No carotid bruits. Respiratory:  Clear to auscultation, good air entry bilaterally.  She is able to count to 42 on deep inhalation Cardiac:  Regular rate and rhythm, no murmur.   Extremities:  No deformities, edema, or skin discoloration.  Skin:  No rashes or lesions.  Neurological Exam: MENTAL STATUS including orientation to time, place, person, recent and remote memory, attention span and concentration, language, and fund of knowledge is normal.  Speech is not dysarthric.  CRANIAL NERVES: II:  No visual field defects.  Unremarkable fundi.   III-IV-VI: Pupils equal round and reactive to light.  Normal conjugate, extra-ocular eye movements in all directions of gaze.  No nystagmus.  No ptosis. V:  Normal facial sensation.   VII:  Normal facial symmetry and movements. VIII:  Normal hearing and vestibular function.   IX-X:  Normal palatal movement.   XI:  Normal shoulder shrug and  head rotation.   XII:  Normal tongue strength and range of motion, no deviation or fasciculation.  MOTOR:  No atrophy, fasciculations or abnormal movements.  No pronator drift.  Tone is normal.  There is no muscle fatigability with proximal muscle testing.  Neck flexion and extension is 5/5 Right Upper Extremity:    Left Upper Extremity:    Deltoid  5/5   Deltoid  5/5   Biceps  5/5   Biceps  5/5   Triceps  5/5   Triceps  5/5   Wrist extensors  5/5   Wrist extensors  5/5   Wrist flexors  5/5   Wrist flexors  5/5   Finger extensors  5/5   Finger extensors  5/5   Finger flexors  5/5   Finger flexors  5/5   Dorsal interossei  5/5   Dorsal interossei  5/5   Abductor pollicis  5/5   Abductor pollicis  5/5   Tone (Ashworth scale)  0  Tone (Ashworth scale)  0   Right Lower Extremity:    Left Lower Extremity:    Hip flexors  5/5   Hip flexors  5/5   Hip extensors  5/5   Hip extensors  5/5   Knee flexors  5/5   Knee flexors  5/5   Knee extensors  5/5   Knee extensors  5/5   Dorsiflexors  5/5   Dorsiflexors  5/5   Plantarflexors  5/5   Plantarflexors  5/5   Toe extensors  5/5   Toe extensors  5/5   Toe flexors  5/5   Toe flexors  5/5   Tone (Ashworth scale)  0  Tone (Ashworth scale)  0   MSRs:  Right                                                                 Left brachioradialis 2+  brachioradialis 2+  biceps 2+  biceps 2+  triceps 2+  triceps 2+  patellar 2+  patellar 2+  ankle jerk 1+  ankle  jerk 1+  Hoffman no  Hoffman no  plantar response down  plantar response down   SENSORY:  Normal and symmetric perception of light touch, pinprick, vibration, and proprioception.  Romberg's sign absent.   COORDINATION/GAIT: Normal finger-to- nose-finger and heel-to-shin.  Intact rapid alternating movements bilaterally.  Able to rise from a chair without using arms.  Gait narrow based and stable. Mild unsteadiness with tandem gait, but able to perform.  Stressed gait intact.     IMPRESSION: Ann Hunter is a 70 year-old female referred for evaluation of neuromuscular respiratory weakness.  Her exam does not show any bulbar or limb weakness and there are no worrisome findings such as hyperrreflexia or muscle fasciculations which would raise any concern for motor neuron disease.  I will check her muscle enzymes and screen her for myasthenia gravis.  I also discussed doing electrodiagnostic testing to better look for myopathic changes as well as neuromuscular junction disorder, but she wishes to complete blood testings first. Overall, my suspicion for primary neuromuscular disorder is low given her normal strength testing and exam.    PLAN/RECOMMENDATIONS:  1.  Check CK, aldolase, myasthenia gravis antibodies 2.   If labs returns normal, she is agreeable to NCS/EMG of the left arm and leg - RNS protocol.    The duration of this appointment Hunter was 45 minutes of face-to-face time with the patient.  Greater than 50% of this time was spent in counseling, explanation of diagnosis, planning of further management, and coordination of care.   Thank you for allowing me to participate in patient's care.  If I can answer any additional questions, I would be pleased to do so.    Sincerely,    Ann K. Posey Pronto, DO

## 2015-11-28 NOTE — Patient Instructions (Addendum)
1.  Check blood work 2.  If you labs return normal, we may need to do additional nerve testing

## 2015-12-01 LAB — ALDOLASE: Aldolase: 2.9 U/L (ref <=8.1)

## 2015-12-02 ENCOUNTER — Telehealth: Payer: Self-pay

## 2015-12-02 NOTE — Telephone Encounter (Signed)
-----   Message from Juanito Doom, MD sent at 12/02/2015  3:40 PM EDT ----- Ann Hunter,  Please let her know that her overnight oximetry test on 2 L of oxygen was completely normal and she should continue using oxygen as she is doing.  Thanks, Ruby Cola

## 2015-12-02 NOTE — Telephone Encounter (Signed)
Spoke with pt, aware of results and recs.  Nothing further needed.  

## 2015-12-06 LAB — MYASTHENIA GRAVIS PANEL 2
Acetylcholine Rec Mod Ab: 15 % binding inhibition
Aceytlcholine Rec Bloc Ab: <15 % of inhibition (ref <15)

## 2015-12-08 ENCOUNTER — Telehealth: Payer: Self-pay | Admitting: Pulmonary Disease

## 2015-12-08 NOTE — Telephone Encounter (Signed)
Spoke with Ann Hunter who reported she has already faxed this signed CMN after lunch this afternoon - it looks like it was being faxed about the same time that Ann Hunter called the office  [facility], line rang multiple times with no answer and no option

## 2015-12-09 NOTE — Telephone Encounter (Signed)
Called and spoke with York Cerise at [facility] and advised that CMN has already been faxed back from our office.  Nothing further needed at this time.

## 2015-12-10 ENCOUNTER — Ambulatory Visit: Payer: Medicare Other | Admitting: Pulmonary Disease

## 2015-12-11 ENCOUNTER — Encounter: Payer: Self-pay | Admitting: Pulmonary Disease

## 2015-12-11 ENCOUNTER — Ambulatory Visit (INDEPENDENT_AMBULATORY_CARE_PROVIDER_SITE_OTHER): Payer: Medicare Other | Admitting: Pulmonary Disease

## 2015-12-11 DIAGNOSIS — I272 Other secondary pulmonary hypertension: Secondary | ICD-10-CM

## 2015-12-11 DIAGNOSIS — J984 Other disorders of lung: Secondary | ICD-10-CM | POA: Diagnosis not present

## 2015-12-11 MED ORDER — AMBRISENTAN 5 MG PO TABS: 5.0000 mg | ORAL_TABLET | Freq: Every day | ORAL | 5 refills | Status: DC

## 2015-12-11 NOTE — Assessment & Plan Note (Signed)
This appears to be [facility] group 1 disease. Even know she has mild restrictive lung disease, she does not have hypercapnic respiratory failure so I can expect that if we treat her with BiPAP were going to make any difference.  Plan: Start Letairis LFTs 6 weeks 6 minute walk 6 weeks, escalate dose at that point and then start process to start Adcirca

## 2015-12-11 NOTE — Patient Instructions (Signed)
Start taking Letairis 5 mg daily Follow-up in 6 weeks with blood work and a 6 minute walk test

## 2015-12-11 NOTE — Assessment & Plan Note (Signed)
Still not clear to me why she has restrictive lung disease as she has no evidence of interstitial lung disease. There is no evidence of a chest wall abnormality. She may have mild kyphosis but I don't think this is severe enough to explain this. She is still undergoing neurologic evaluation for this.  Plan: I explained to her that it is very important for her to follow-up with the nerve conduction studies.

## 2015-12-11 NOTE — Progress Notes (Signed)
Subjective:    Patient ID: Ann Hunter, female    DOB: 08/09/1945, 70 y.o.   MRN: [id number]  Synopsis: referred by Dr. Guadalupe Hunter in 2017 for pulmonary hypertension.  02/2015 HRCT > personally reviewed, mild atelectasis in the bases, no ILD; mosaicism noted  December 2016 pulmonary function testing ratio 77%, FEV1 1.67 L, FVC 2.17 L (73% predicted), total lung capacity 0.34 L (66% protected), ERV 10% predicted DLCO 14.64 (60% predicted).  May 2017 right heart catheterization right atrial mean pressure 2 mmHg, right ventricular systolic pressure 56, pulmonary artery mean pressure 42 mmHg, pulmonary capillary wedge pressure mean 13 mmHg, left ventricular end-diastolic pressure 14.  No coronary artery disease.  Blood work performed by Dr. Chase Hunter in May 2017 shows a slightly elevated antineutrophilic antibody panel at 1-40, CCP negative, double-stranded DNA negative, rheumatoid factor negative, SSA and SSB both negative. Scleroderma antibody negative.  09/04/2015 VQ scan no evidence of pulmonary embolism.  June 2017 respiratory muscle strength testing: MIP 49 cm H20 (37% pred), MEP -35 cm H20, 53% pred   HPI Chief Complaint  Patient presents with  . Follow-up    review neuro appt and sleep study.  Pt states she is not tolerating nocturnal 02 well, states "02 pushes against me".    December's work up is ongoing with the nerve conduction studies and blood work from neurology. She says that she is not using the oxygen at night because she doesn't like the way it makes her feel.  She says taht many of her symptoms started after she was treated with BCG for her bladder cancer.  She was quited sedentary then. She wonders if this contributed to her dyspnea.     Past Medical History:  Diagnosis Date  . B12 deficiency   . Bladder cancer ([place])   . Closed head injury   . Essential hypertension, benign   . GERD (gastroesophageal reflux disease)   . Head injury, unspecified   . Memory  difficulty    d/t head injury  . Osteoporosis, unspecified   . Paroxysmal atrial fibrillation ([facility])    during septic shock, none since  . Pure hypercholesterolemia   . Type II or unspecified type diabetes mellitus without mention of complication, uncontrolled   . Unspecified vitamin D deficiency       Review of Systems  Constitutional: Negative for chills, fatigue and fever.  HENT: Negative for rhinorrhea, sinus pressure and sneezing.   Respiratory: Positive for shortness of breath. Negative for cough and wheezing.   Cardiovascular: Negative for chest pain, palpitations and leg swelling.       Objective:   Physical Exam  Vitals:   12/11/15 1044  BP: (!) 142/64  Pulse: 66  SpO2: 95%  Weight: 147 lb (66.7 kg)  Height: 5\' 4"  (1.626 m)  RA  Gen: well appearing HENT: OP clear, TM's clear, neck supple PULM: CTA B, normal percussion CV: RRR, no mgr, trace edema, JVD noted GI: BS+, soft, nontender Derm: no cyanosis or rash Psyche: normal mood and affect   Records from neurology reviewed where she was evaluated for possible neuromuscular weakness. She is to undergo nerve conduction study testing this week.  Acetylcholine receptor antibodies negative.      Assessment & Plan:  Restrictive lung disease Still not clear to me why she has restrictive lung disease as she has no evidence of interstitial lung disease. There is no evidence of a chest wall abnormality. She may have mild kyphosis but I don't think this is  severe enough to explain this. She is still undergoing neurologic evaluation for this.  Plan: I explained to her that it is very important for her to follow-up with the nerve conduction studies.  Pulmonary hypertension ([place]) This appears to be [facility] group 1 disease. Even know she has mild restrictive lung disease, she does not have hypercapnic respiratory failure so I can expect that if we treat her with BiPAP were going to make any  difference.  Plan: Start Letairis LFTs 6 weeks 6 minute walk 6 weeks, escalate dose at that point and then start process to start Adcirca    Current Outpatient Prescriptions:  .  carvedilol (COREG) 25 MG tablet, TAKE 1 TABLET TWICE A DAY FOR 90 DAY(S), Disp: , Rfl: 1 .  glimepiride (AMARYL) 4 MG tablet, Take 4 mg by mouth 2 (two) times daily. , Disp: , Rfl:  .  hydrALAZINE (APRESOLINE) 50 MG tablet, Take 50 mg by mouth 2 (two) times daily., Disp: , Rfl: 3 .  losartan (COZAAR) 100 MG tablet, Take 100 mg by mouth daily.  , Disp: , Rfl:  .  metFORMIN (GLUCOPHAGE) 1000 MG tablet, Take 1,000 mg by mouth 2 (two) times daily with a meal., Disp: , Rfl:  .  pravastatin (PRAVACHOL) 20 MG tablet, Take 20 mg by mouth daily. , Disp: , Rfl:  .  ambrisentan (LETAIRIS) 5 MG tablet, Take 1 tablet (5 mg total) by mouth daily., Disp: 30 tablet, Rfl: 5

## 2015-12-17 ENCOUNTER — Other Ambulatory Visit: Payer: Self-pay | Admitting: *Deleted

## 2015-12-17 DIAGNOSIS — G609 Hereditary and idiopathic neuropathy, unspecified: Secondary | ICD-10-CM

## 2015-12-19 ENCOUNTER — Telehealth: Payer: Self-pay | Admitting: Pulmonary Disease

## 2015-12-19 NOTE — Telephone Encounter (Signed)
Error no message needed.Ann Hunter' °

## 2015-12-19 NOTE — Telephone Encounter (Signed)
Letaris 5mg  has been approved through 12/18/2018 Will send to [place] to make aware.

## 2015-12-23 ENCOUNTER — Telehealth: Payer: Self-pay | Admitting: Pulmonary Disease

## 2015-12-23 NOTE — Telephone Encounter (Signed)
Spoke with Ann Hunter @ [facility] and advised that BQ does not order labs through them for Letairis pt's. She states that she will remove pt from lab list but that this does not affect her enrollment. Nothing further at this time.

## 2015-12-23 NOTE — Telephone Encounter (Signed)
This form was received but we do not order labs through [facility] with patients on Letairis, so it was not faxed back.   Thanks!

## 2015-12-23 NOTE — Telephone Encounter (Signed)
Returned to Helene Kelp - states that a lab order form was faxed on 12/16/15 from [facility] for [employer].  Caryl Pina have you received anything?  I have requested this be refaxed to the main office number.

## 2015-12-25 ENCOUNTER — Telehealth: Payer: Self-pay | Admitting: Neurology

## 2015-12-25 ENCOUNTER — Telehealth: Payer: Self-pay | Admitting: Pulmonary Disease

## 2015-12-25 ENCOUNTER — Ambulatory Visit (INDEPENDENT_AMBULATORY_CARE_PROVIDER_SITE_OTHER): Payer: Medicare Other | Admitting: Neurology

## 2015-12-25 DIAGNOSIS — G609 Hereditary and idiopathic neuropathy, unspecified: Secondary | ICD-10-CM

## 2015-12-25 DIAGNOSIS — R0602 Shortness of breath: Secondary | ICD-10-CM

## 2015-12-25 DIAGNOSIS — R531 Weakness: Secondary | ICD-10-CM

## 2015-12-25 NOTE — Telephone Encounter (Signed)
Spoke with Olean Ree at [facility]. States that pt's husband asked them not ship to [employer]. The copay on this medication is over $2,000. They can't afford this. Olean Ree said that they referred them to [facility] in house patient assistance and the pt didn't qualify. I have left a message with the pt to call us back.

## 2015-12-25 NOTE — Telephone Encounter (Signed)
Results of EMG discussed with patient which was normal and shows no evidence of a neuromuscular junction disorder or myopathy.  She will follow-up with Dr. Lake Bells for further recommendations.  Zilah Villaflor K. Posey Pronto, DO

## 2015-12-25 NOTE — Procedures (Signed)
[facility]  [place], [place]  [place], [place] [address] Tel: [phone] Fax:  [phone] Test Date:  12/25/2015  Patient: Ann Hunter DOB: 10/08/1945 Physician: Narda Amber, DO  Sex: Female Height: 5\' 4"  Ref Phys: Narda Amber, DO  ID#: [id number] Temp: 35.2C Technician: Jerilynn Mages. Dean   Patient Complaints: This is a 70 year old female referred for evaluation of shortness of breath and weakness.  NCV & EMG Findings: Extensive electrodiagnostic testing of the left upper and lower extremities as well as repetitive nerve stimulation shows:  1. All sensory responses including the left median, sural, and superficial peroneal nerves are within normal limits 2. All motor responses including the left median and peroneal nerves are within normal limits. 3. Repetitive nerve stimulation of the spinal accessory, median, and peroneal nerves recording at the trapezius, abductor pollicis brevis, and anterior tibialis muscles, respectively, is within normal limits. In particular, there is no evidence of motor decrement. 4. There is no evidence of active or chronic motor axon loss changes affecting any of the tested muscles. Motor unit configuration and recruitment pattern is within normal limits.  Impression: This is a normal study.   In particular, there is no evidence of a neuromuscular junction disorder, diffuse myopathy, generalized sensorimotor polyneuropathy, or cervical/lumbosacral radiculopathy.   ___________________________ Narda Amber, DO    Nerve Conduction Studies Anti Sensory Summary Table   Stim Site NR Peak (ms) Norm Peak (ms) P-T Amp (V) Norm P-T Amp  Left Median Anti Sensory (2nd Digit)  35.2C  Wrist    3.5 <3.8 10.6 >10  Left Sup Peroneal Anti Sensory (Ant Lat Mall)  35.2C  12 cm    3.8 <4.6 3.3 >3  Left Sural Anti Sensory (Lat Mall)  35.2C  Calf    1.8 <4.6 3.1 >3   Motor Summary Table   Stim Site NR Onset (ms) Norm Onset (ms) O-P Amp (mV)  Norm O-P Amp Site1 Site2 Delta-0 (ms) Dist (cm) Vel (m/s) Norm Vel (m/s)  Left Median Motor (Abd Poll Brev)  35.2C  Wrist    3.6 <4.0 8.0 >5 Elbow Wrist 4.0 22.0 55 >50  Elbow    7.6  7.2         Left Peroneal Motor (Ext Dig Brev)  35.2C  Ankle    4.1 <6.0 3.8 >2.5 B Fib Ankle 7.3 32.0 44 >40  B Fib    11.4  3.1  Poplt B Fib 2.4 10.0 42 >40  Poplt    13.8  2.8         Left Peroneal TA Motor (Tib Ant)  35.2C  Fib Head    2.7 <4.5 3.1 >3 Poplit Fib Head 1.6 10.0 63 >40  Poplit    4.3  2.4          EMG   Side Muscle Ins Act Fibs Psw Fasc Number Recrt Dur Dur. Amp Amp. Poly Poly. Comment  Left AntTibialis Nml Nml Nml Nml Nml Nml Nml Nml Nml Nml Nml Nml N/A  Left Gastroc Nml Nml Nml Nml Nml Nml Nml Nml Nml Nml Nml Nml N/A  Left RectFemoris Nml Nml Nml Nml Nml Nml Nml Nml Nml Nml Nml Nml N/A  Left BicepsFemS Nml Nml Nml Nml Nml Nml Nml Nml Nml Nml Nml Nml N/A  Left 1stDorInt Nml Nml Nml Nml Nml Nml Nml Nml Nml Nml Nml Nml N/A  Left PronatorTeres Nml Nml Nml Nml Nml Nml Nml Nml Nml Nml Nml Nml N/A  Left Biceps Nml  Nml Nml Nml Nml Nml Nml Nml Nml Nml Nml Nml N/A  Left Triceps Nml Nml Nml Nml Nml Nml Nml Nml Nml Nml Nml Nml N/A  Left Deltoid Nml Nml Nml Nml Nml Nml Nml Nml Nml Nml Nml Nml N/A  Left GluteusMed Nml Nml Nml Nml Nml Nml Nml Nml Nml Nml Nml Nml N/A  Left Flex Dig Long Nml Nml Nml Nml Nml Nml Nml Nml Nml Nml Nml Nml N/A   RNS   Trial # Label Amp 1 (mV)  O-P Amp 5 (mV)  O-P Amp % Dif Area 1 (mVms) Area 5 (mVms) Area % Dif Rep Rate Train Length Pause Time (min:sec) Comments  Left AntTibialis  Tr 1 Baseline 3.30 3.13 -5.2 15.15 14.59 -3.7 3.00 10 00:30   Tr 2 Post Exercise 4.06 3.85 -5.2 17.64 17.66 0.1 3.00 10 01:00   Tr 3 1 Min Post 3.60 3.28 -8.7 16.15 14.68 -9.1 3.00 10 01:00   Tr 4 2 Min Post 3.60 3.57 -1.1 16.22 17.02 4.9 3.00 10 01:00   Tr 5 3 Min Post 3.89 3.56 -8.4 17.76 16.11 -9.3 3.00 10 00:00   Left Abd Poll Brev  Tr 1 Baseline 8.22 7.58 -7.8 20.67 18.75 -9.3 3.00  10 00:30   Tr 2 Post Exercise 9.04 8.69 -3.9 24.90 23.06 -7.4 3.00 10 01:00   Tr 3 1 Min Post 8.36 7.72 -7.6 22.41 20.81 -7.1 3.00 10 01:00   Tr 4 2 Min Post 8.21 7.52 -8.4 22.18 20.95 -5.6 3.00 10 01:00   Tr 5 3 Min Post 8.09 7.29 -9.9 20.38 18.50 -9.2 3.00 10 00:00   Left Trapezius - Run #2  Tr 1 Baseline 3.11 3.01 -3.2 16.13 15.14 -6.1 3.00 10 00:30   Tr 2 Post Exercise 3.26 3.10 -5.0 17.72 16.14 -8.9 3.00 10 01:00   Tr 3 1 Min Post 3.23 3.15 -2.3 18.26 16.53 -9.5 3.00 10 01:00   Tr 4 2 Min Post 3.25 3.24 -0.4 17.13 16.97 -0.9 3.00 10 01:00   Tr 5 3 Min Post 3.22 3.27 1.5 17.29 17.55 1.5 3.00 10 00:00    Waveforms:

## 2015-12-26 NOTE — Telephone Encounter (Signed)
Called spoke with patient's spouse Ann Hunter) to discuss Ann Hunter's findings from yesterday.  Spouse voiced his understanding and reported pt's copay would actually be right at $2900.  He also mentioned that it was his understanding that the manufacturer has a patient assistance program.  Ann Hunter will research this for him and call back.  Called [facility] and spoke with Ann Hunter who reported that per their system they actually received pt's application from [employer] (since the patient didn't qualify for [employer]' program, they automatically send it to the manufacturer to see they can offer assistance).  Per Ann Hunter the application has been sent to pt's Care Manager Ann Hunter) and pt should be call Monday or Tuesday (this takes about 2 days to process).  Called spoke with pt's spouse again.  Informed him of the above. Ann Hunter voiced his understanding and does ask that Ann Hunter contacts him instead of pt because "she doesn't do well talking to [employer] and stuff."  Verified Ann Hunter's cell phone (his primary contact number) and advised will call [employer] back to offer this information.  Called [employer] again and spoke with Ann Hunter who updated pt's primary contact person and number to Ann Hunter.  Nothing further needed; will sign off.   Per [employer] website: [employer] and [employer] (LEAP) helps patients navigate the reimbursement process, and assist, those without adequate financial resources identify alternate means of support for access to Letairis (ambrisentan).  To find out if you are eligible for this patient assistance program, call [phone] Monday through Friday between 9:00 a.m. and 8:00 p.m. Ann Hunter).

## 2015-12-29 NOTE — Telephone Encounter (Signed)
Thanks

## 2016-01-08 ENCOUNTER — Telehealth: Payer: Self-pay | Admitting: Pulmonary Disease

## 2016-01-08 NOTE — Telephone Encounter (Signed)
Called spoke with pt. She reports she should receive her letaris today. She wants to know if she should start this today or wait? Also wants to know if she needs any blood work before she is suppose to start this? Please advise Dr. Lake Bells thanks

## 2016-01-08 NOTE — Telephone Encounter (Signed)
She is OK to take it as prescribed

## 2016-01-08 NOTE — Telephone Encounter (Signed)
Called made pt aware. Nothing further needed 

## 2016-01-15 ENCOUNTER — Telehealth: Payer: Self-pay | Admitting: Pulmonary Disease

## 2016-01-15 NOTE — Telephone Encounter (Signed)
Ok to try stopping Letairis for the rest of this week to see if she feels better without it. Then call us back to let Dr Lake Bells know how she is doing.,

## 2016-01-15 NOTE — Telephone Encounter (Signed)
Spoke with pt husband who states wife is on day 7 of letairis, pt states breathing has worsen since starting letairis. Pt c/o increased sob, increased fatigued & weakness. Pt is also concerned about 02 level typically running 95-97 but now running 91-92.  CY please advise. Thanks.   Current Outpatient Prescriptions on File Prior to Visit  Medication Sig Dispense Refill  . ambrisentan (LETAIRIS) 5 MG tablet Take 1 tablet (5 mg total) by mouth daily. 30 tablet 5  . carvedilol (COREG) 25 MG tablet TAKE 1 TABLET TWICE A DAY FOR 90 DAY(S)  1  . glimepiride (AMARYL) 4 MG tablet Take 4 mg by mouth 2 (two) times daily.     . hydrALAZINE (APRESOLINE) 50 MG tablet Take 50 mg by mouth 2 (two) times daily.  3  . losartan (COZAAR) 100 MG tablet Take 100 mg by mouth daily.      . metFORMIN (GLUCOPHAGE) 1000 MG tablet Take 1,000 mg by mouth 2 (two) times daily with a meal.    . pravastatin (PRAVACHOL) 20 MG tablet Take 20 mg by mouth daily.      No current facility-administered medications on file prior to visit.    Allergies  Allergen Reactions  . Advil [Ibuprofen] Swelling  . Codeine Shortness Of Breath    Throat Swelling  . Ranitidine Anaphylaxis  . Sitagliptin Anaphylaxis and Swelling  . Ace Inhibitors   . Aleve [Naproxen Sodium]   . Boniva [Ibandronic Acid] Diarrhea    Swelling   . Clonidine Derivatives   . Januvia [Sitagliptin Phosphate]   . Penicillins Swelling    Has patient had a PCN reaction causing immediate rash, facial/tongue/throat swelling, SOB or lightheadedness with hypotension: Yes Has patient had a PCN reaction causing severe rash involving mucus membranes or skin necrosis: No Has patient had a PCN reaction that required hospitalization No Has patient had a PCN reaction occurring within the last 10 years: No If all of the above answers are "NO", then may proceed with Cephalosporin use.    . Sulfa Antibiotics     Throat swells

## 2016-01-15 NOTE — Telephone Encounter (Signed)
Pt aware of CY recommendations. Pt voiced understanding and had no further questions. Nothing further needed.

## 2016-01-18 ENCOUNTER — Encounter (HOSPITAL_COMMUNITY): Payer: Self-pay | Admitting: Emergency Medicine

## 2016-01-18 ENCOUNTER — Emergency Department (HOSPITAL_COMMUNITY): Payer: Medicare Other

## 2016-01-18 ENCOUNTER — Inpatient Hospital Stay (HOSPITAL_COMMUNITY)
Admission: EM | Admit: 2016-01-18 | Discharge: 2016-01-22 | DRG: 291 | Disposition: A | Discharge status: Home / Self Care | Payer: Medicare Other | Attending: Internal Medicine | Admitting: Internal Medicine

## 2016-01-18 DIAGNOSIS — I272 Pulmonary hypertension, unspecified: Secondary | ICD-10-CM | POA: Diagnosis present

## 2016-01-18 DIAGNOSIS — Z833 Family history of diabetes mellitus: Secondary | ICD-10-CM

## 2016-01-18 DIAGNOSIS — E119 Type 2 diabetes mellitus without complications: Secondary | ICD-10-CM

## 2016-01-18 DIAGNOSIS — R079 Chest pain, unspecified: Secondary | ICD-10-CM

## 2016-01-18 DIAGNOSIS — Z881 Allergy status to other antibiotic agents status: Secondary | ICD-10-CM | POA: Diagnosis not present

## 2016-01-18 DIAGNOSIS — E1165 Type 2 diabetes mellitus with hyperglycemia: Secondary | ICD-10-CM | POA: Diagnosis present

## 2016-01-18 DIAGNOSIS — Z87892 Personal history of anaphylaxis: Secondary | ICD-10-CM | POA: Diagnosis not present

## 2016-01-18 DIAGNOSIS — Z8551 Personal history of malignant neoplasm of bladder: Secondary | ICD-10-CM

## 2016-01-18 DIAGNOSIS — Z882 Allergy status to sulfonamides status: Secondary | ICD-10-CM

## 2016-01-18 DIAGNOSIS — J90 Pleural effusion, not elsewhere classified: Secondary | ICD-10-CM

## 2016-01-18 DIAGNOSIS — N179 Acute kidney failure, unspecified: Secondary | ICD-10-CM

## 2016-01-18 DIAGNOSIS — Z7984 Long term (current) use of oral hypoglycemic drugs: Secondary | ICD-10-CM | POA: Diagnosis not present

## 2016-01-18 DIAGNOSIS — K219 Gastro-esophageal reflux disease without esophagitis: Secondary | ICD-10-CM | POA: Diagnosis present

## 2016-01-18 DIAGNOSIS — Z888 Allergy status to other drugs, medicaments and biological substances status: Secondary | ICD-10-CM | POA: Diagnosis not present

## 2016-01-18 DIAGNOSIS — T502X5A Adverse effect of carbonic-anhydrase inhibitors, benzothiadiazides and other diuretics, initial encounter: Secondary | ICD-10-CM | POA: Diagnosis not present

## 2016-01-18 DIAGNOSIS — Z8249 Family history of ischemic heart disease and other diseases of the circulatory system: Secondary | ICD-10-CM | POA: Diagnosis not present

## 2016-01-18 DIAGNOSIS — I5031 Acute diastolic (congestive) heart failure: Secondary | ICD-10-CM | POA: Diagnosis present

## 2016-01-18 DIAGNOSIS — R0902 Hypoxemia: Secondary | ICD-10-CM | POA: Diagnosis not present

## 2016-01-18 DIAGNOSIS — R0600 Dyspnea, unspecified: Secondary | ICD-10-CM | POA: Diagnosis not present

## 2016-01-18 DIAGNOSIS — J9600 Acute respiratory failure, unspecified whether with hypoxia or hypercapnia: Secondary | ICD-10-CM | POA: Diagnosis present

## 2016-01-18 DIAGNOSIS — D649 Anemia, unspecified: Secondary | ICD-10-CM | POA: Diagnosis present

## 2016-01-18 DIAGNOSIS — I110 Hypertensive heart disease with heart failure: Principal | ICD-10-CM | POA: Diagnosis present

## 2016-01-18 DIAGNOSIS — Z880 Allergy status to penicillin: Secondary | ICD-10-CM | POA: Diagnosis not present

## 2016-01-18 DIAGNOSIS — J984 Other disorders of lung: Secondary | ICD-10-CM | POA: Diagnosis present

## 2016-01-18 DIAGNOSIS — R0789 Other chest pain: Secondary | ICD-10-CM | POA: Diagnosis present

## 2016-01-18 DIAGNOSIS — J9601 Acute respiratory failure with hypoxia: Secondary | ICD-10-CM | POA: Diagnosis present

## 2016-01-18 DIAGNOSIS — I10 Essential (primary) hypertension: Secondary | ICD-10-CM

## 2016-01-18 DIAGNOSIS — E7800 Pure hypercholesterolemia, unspecified: Secondary | ICD-10-CM | POA: Diagnosis present

## 2016-01-18 HISTORY — DX: Acute diastolic (congestive) heart failure: I50.31

## 2016-01-18 HISTORY — DX: Pulmonary hypertension, unspecified: I27.20

## 2016-01-18 LAB — CBG MONITORING, ED: Glucose-Capillary: 130 mg/dL — ABNORMAL HIGH (ref 65–99)

## 2016-01-18 LAB — I-STAT TROPONIN, ED: Troponin i, poc: 0.01 ng/mL (ref 0.00–0.08)

## 2016-01-18 LAB — BASIC METABOLIC PANEL
Anion gap: 7 (ref 5–15)
BUN: 15 mg/dL (ref 6–20)
CHLORIDE: 105 mmol/L (ref 101–111)
CO2: 24 mmol/L (ref 22–32)
CREATININE: 0.97 mg/dL (ref 0.44–1.00)
Calcium: 10.1 mg/dL (ref 8.9–10.3)
GFR calc Af Amer: >60 mL/min (ref >60)
GFR calc non Af Amer: 57 mL/min — ABNORMAL LOW (ref >60)
GLUCOSE: 176 mg/dL — AB (ref 65–99)
Potassium: 4.6 mmol/L (ref 3.5–5.1)
SODIUM: 136 mmol/L (ref 135–145)

## 2016-01-18 LAB — BRAIN NATRIURETIC PEPTIDE: B Natriuretic Peptide: 576.4 pg/mL — ABNORMAL HIGH (ref 0.0–100.0)

## 2016-01-18 LAB — CBC
HEMATOCRIT: 31.9 % — AB (ref 36.0–46.0)
Hemoglobin: 9.6 g/dL — ABNORMAL LOW (ref 12.0–15.0)
MCH: 26.5 pg (ref 26.0–34.0)
MCHC: 30.1 g/dL (ref 30.0–36.0)
MCV: 88.1 fL (ref 78.0–100.0)
PLATELETS: 251 10*3/uL (ref 150–400)
RBC: 3.62 MIL/uL — ABNORMAL LOW (ref 3.87–5.11)
RDW: 16.4 % — AB (ref 11.5–15.5)
WBC: 6.4 10*3/uL (ref 4.0–10.5)

## 2016-01-18 LAB — GLUCOSE, CAPILLARY: Glucose-Capillary: 125 mg/dL — ABNORMAL HIGH (ref 65–99)

## 2016-01-18 LAB — TROPONIN I

## 2016-01-18 MED ORDER — FUROSEMIDE 10 MG/ML IJ SOLN
40.0000 mg | Freq: Once | INTRAMUSCULAR | Status: AC
  Administered 2016-01-18: 40 mg via INTRAVENOUS
  Filled 2016-01-18: qty 4

## 2016-01-18 MED ORDER — HYDRALAZINE HCL 50 MG PO TABS
50.0000 mg | ORAL_TABLET | Freq: Two times a day (BID) | ORAL | Status: DC
  Administered 2016-01-18 – 2016-01-22 (×8): 50 mg via ORAL
  Filled 2016-01-18 (×8): qty 1

## 2016-01-18 MED ORDER — ASPIRIN EC 81 MG PO TBEC
81.0000 mg | DELAYED_RELEASE_TABLET | Freq: Every day | ORAL | Status: DC
  Administered 2016-01-19 – 2016-01-22 (×4): 81 mg via ORAL
  Filled 2016-01-18 (×4): qty 1

## 2016-01-18 MED ORDER — GI COCKTAIL ~~LOC~~: 30.0000 mL | Freq: Once | ORAL | Status: DC

## 2016-01-18 MED ORDER — LORAZEPAM 2 MG/ML IJ SOLN
0.5000 mg | Freq: Once | INTRAMUSCULAR | Status: AC
  Administered 2016-01-18: 0.5 mg via INTRAVENOUS
  Filled 2016-01-18: qty 1

## 2016-01-18 MED ORDER — PRAVASTATIN SODIUM 20 MG PO TABS
20.0000 mg | ORAL_TABLET | Freq: Every evening | ORAL | Status: DC
  Administered 2016-01-18 – 2016-01-21 (×4): 20 mg via ORAL
  Filled 2016-01-18 (×4): qty 1

## 2016-01-18 MED ORDER — ALBUTEROL SULFATE (2.5 MG/3ML) 0.083% IN NEBU: 2.5000 mg | INHALATION_SOLUTION | RESPIRATORY_TRACT | Status: DC | PRN

## 2016-01-18 MED ORDER — LOSARTAN POTASSIUM 50 MG PO TABS
100.0000 mg | ORAL_TABLET | ORAL | Status: DC
  Administered 2016-01-19 – 2016-01-22 (×4): 100 mg via ORAL
  Filled 2016-01-18 (×4): qty 2

## 2016-01-18 MED ORDER — ASPIRIN 81 MG PO CHEW
324.0000 mg | CHEWABLE_TABLET | Freq: Once | ORAL | Status: AC
  Administered 2016-01-18: 324 mg via ORAL
  Filled 2016-01-18: qty 4

## 2016-01-18 MED ORDER — FUROSEMIDE 10 MG/ML IJ SOLN
40.0000 mg | Freq: Two times a day (BID) | INTRAMUSCULAR | Status: DC
  Administered 2016-01-18 – 2016-01-21 (×6): 40 mg via INTRAVENOUS
  Filled 2016-01-18 (×6): qty 4

## 2016-01-18 MED ORDER — CARVEDILOL 25 MG PO TABS
25.0000 mg | ORAL_TABLET | Freq: Two times a day (BID) | ORAL | Status: DC
  Administered 2016-01-19 – 2016-01-21 (×6): 25 mg via ORAL
  Filled 2016-01-18 (×6): qty 1

## 2016-01-18 MED ORDER — INSULIN ASPART 100 UNIT/ML ~~LOC~~ SOLN
0.0000 [IU] | Freq: Three times a day (TID) | SUBCUTANEOUS | Status: DC
  Administered 2016-01-19 – 2016-01-20 (×3): 2 [IU] via SUBCUTANEOUS
  Administered 2016-01-20: 3 [IU] via SUBCUTANEOUS
  Administered 2016-01-21: 5 [IU] via SUBCUTANEOUS
  Administered 2016-01-21 – 2016-01-22 (×3): 2 [IU] via SUBCUTANEOUS

## 2016-01-18 MED ORDER — HEPARIN SODIUM (PORCINE) 5000 UNIT/ML IJ SOLN
5000.0000 [IU] | Freq: Three times a day (TID) | INTRAMUSCULAR | Status: DC
  Administered 2016-01-18 – 2016-01-21 (×7): 5000 [IU] via SUBCUTANEOUS
  Filled 2016-01-18 (×8): qty 1

## 2016-01-18 MED ORDER — IOPAMIDOL (ISOVUE-370) INJECTION 76%
INTRAVENOUS | Status: AC
  Administered 2016-01-18: 80 mL
  Filled 2016-01-18: qty 100

## 2016-01-18 MED ORDER — SODIUM CHLORIDE 0.9% FLUSH
3.0000 mL | Freq: Two times a day (BID) | INTRAVENOUS | Status: DC
  Administered 2016-01-18 – 2016-01-21 (×6): 3 mL via INTRAVENOUS

## 2016-01-18 MED ORDER — INSULIN ASPART 100 UNIT/ML ~~LOC~~ SOLN
0.0000 [IU] | Freq: Every day | SUBCUTANEOUS | Status: DC
  Administered 2016-01-20: 3 [IU] via SUBCUTANEOUS

## 2016-01-18 MED ORDER — PANTOPRAZOLE SODIUM 40 MG PO TBEC
40.0000 mg | DELAYED_RELEASE_TABLET | Freq: Every day | ORAL | Status: DC
  Administered 2016-01-18 – 2016-01-22 (×3): 40 mg via ORAL
  Filled 2016-01-18 (×4): qty 1

## 2016-01-18 NOTE — ED Triage Notes (Signed)
Per [place] was en route to hospital this morning with her husband from [facility] due to shortness of breath since last night when patient started to experience chest pressure and stopped [facility] an EMS base for transport.  Per EMS patient O2 sat 91% on room air, 96% with 2L [place].  Patient alert and oriented, ambulated to restroom with little assistance on arrival to ED.  20g saline locked in left hand.  Patient in no apparent distress at this time.

## 2016-01-18 NOTE — H&P (Signed)
TR[facility] H&P   Patient Demographics:    Ann Hunter, is a 70 y.o. female  MRN: [id number]   DOB - 08-Nov-1945  Admit Date - 01/18/2016  Outpatient Primary MD for the patient is Lillard Anes, MD  Referring MD/NP/PA: Prince Solian  Outpatient Specialists: Pulm Dr Waunita Schooner, card dr Gwenlyn Found  Patient coming from: Home  Chief Complaint  Patient presents with  . Shortness of Breath      HPI:    Ann Hunter  is a 70 y.o. female, With past medical history of pulmonary hypertension, restrictive lung disease, followed by Dr. Curt Jews, type 2 diabetes, hypertension, hyperlipidemia, patient has been followed with pulmonary for chronic dyspnea, workup significant for restrictive lung disease, but no evidence of ILD or other cause, patient reports progressive dyspnea over last 10 days, she was recently started on Letairis for pulmonary hypertension, patient reports she had diffuse extra pillow and wedge to sleep recently, denies any cough, productive sputum, fever chills or lower extremity edema. As well she reports chest pressure midsternal, intermittent, accompanied by her dyspnea, but was reproducible by palpation on my exam, as discussing with the ED physician, patient hypoxic 84% on room air. - In ED, workup significant for hypoxia 84% on room air by ED physician, CTA chest significant for pulmonary edema and bilateral pleural effusion    Review of systems:    In addition to the HPI above, No Fever-chills, No Headache, No changes with Vision or hearing, No problems swallowing food or Liquids, And planes of chest pressure, and shortness of breath, denies cough or productive sputum No Abdominal pain, No Nausea or Vommitting, Bowel movements are regular, No Blood in stool or Urine, No dysuria, No new skin rashes or bruises, No new joints pains-aches,  No new weakness, tingling, numbness  in any extremity, No recent weight gain or loss, No polyuria, polydypsia or polyphagia, No significant Mental Stressors.  A full 10 point Review of Systems was done, except as stated above, all other Review of Systems were negative.   With Past History of the following :    Past Medical History:  Diagnosis Date  . B12 deficiency   . Bladder cancer ([place])   . Closed head injury   . Essential hypertension, benign   . GERD (gastroesophageal reflux disease)   . Head injury, unspecified   . Memory difficulty    d/t head injury  . Osteoporosis, unspecified   . Paroxysmal atrial fibrillation (HCC)    during septic shock, none since  . Pulmonary hypertension   . Pure hypercholesterolemia   . Type II or unspecified type diabetes mellitus without mention of complication, uncontrolled   . Unspecified vitamin D deficiency       Past Surgical History:  Procedure Laterality Date  . APPENDECTOMY  1981  . CARDIAC CATHETERIZATION    . CARDIAC CATHETERIZATION N/A 08/13/2015   Procedure: Right/Left Heart Cath  and Coronary Angiography;  Surgeon: Peter M Martinique, MD;  Location: [facility];  Service: Cardiovascular;  Laterality: N/A;  . TONSILLECTOMY    . TOTAL ABDOMINAL HYSTERECTOMY  1981      Social History:     Social History  Substance Use Topics  . Smoking status: Never Smoker  . Smokeless tobacco: Never Used  . Alcohol use No     Lives - at home  Mobility - independent     Family History :     Family History  Problem Relation Age of Onset  . Coronary artery disease Mother     MI  . Heart disease Father     CHF  . Diabetes Sister   . Diabetes Sister   . Thyroid disease Sister   . Arthritis Sister   . Diabetes Sister       Home Medications:   Prior to Admission medications   Medication Sig Start Date End Date Taking? Authorizing Provider  carvedilol (COREG) 25 MG tablet TAKE 1 TABLET TWICE A DAY FOR 90 DAY(S) 10/01/15  Yes Historical Provider, MD    glimepiride (AMARYL) 4 MG tablet Take 4 mg by mouth 2 (two) times daily.    Yes Historical Provider, MD  hydrALAZINE (APRESOLINE) 50 MG tablet Take 50 mg by mouth 2 (two) times daily. 01/25/15  Yes Historical Provider, MD  losartan (COZAAR) 100 MG tablet Take 100 mg by mouth every morning.    Yes Historical Provider, MD  metFORMIN (GLUCOPHAGE) 1000 MG tablet Take 1,000 mg by mouth 2 (two) times daily with a meal.   Yes Historical Provider, MD  pravastatin (PRAVACHOL) 20 MG tablet Take 20 mg by mouth every evening.  11/29/11  Yes Historical Provider, MD  ambrisentan (LETAIRIS) 5 MG tablet Take 1 tablet (5 mg total) by mouth daily. Patient not taking: Reported on 01/18/2016 12/11/15   Juanito Doom, MD     Allergies:     Allergies  Allergen Reactions  . Advil [Ibuprofen] Swelling  . Codeine Shortness Of Breath    Throat Swelling  . Letairis [Ambrisentan] Shortness Of Breath  . Ranitidine Anaphylaxis  . Sitagliptin Anaphylaxis and Swelling  . Ace Inhibitors   . Aleve [Naproxen Sodium]   . Boniva [Ibandronic Acid] Diarrhea    Swelling   . Clonidine Derivatives   . Januvia [Sitagliptin Phosphate]   . Penicillins Swelling    Has patient had a PCN reaction causing immediate rash, facial/tongue/throat swelling, SOB or lightheadedness with hypotension: Yes Has patient had a PCN reaction causing severe rash involving mucus membranes or skin necrosis: No Has patient had a PCN reaction that required hospitalization No Has patient had a PCN reaction occurring within the last 10 years: No If all of the above answers are "NO", then may proceed with Cephalosporin use.    . Sulfa Antibiotics     Throat swells     Physical Exam:   Vitals  Blood pressure 191/76, pulse 67, temperature 98 F (36.7 C), resp. rate 18, SpO2 (!) 84 %.   1. General Frail female lying in bed in NAD,   2. Normal affect and insight, Not Suicidal or Homicidal, Awake Alert, Oriented X 3.  3. No F.N deficits,  ALL C.Nerves Intact, Strength 5/5 all 4 extremities, Sensation intact all 4 extremities, Plantars down going.  4. Ears and Eyes appear Normal, Conjunctivae clear, PERRLA. Moist Oral Mucosa.  5. Supple Neck, No JVD, No cervical lymphadenopathy appriciated, No Carotid Bruits.  6. Symmetrical  Chest wall movement, diminished air entry at the bases, no wheezing  7. RRR, No Gallops, Rubs or Murmurs, No Parasternal Heave. Has reproducible midsternal chest pain on palpation  8. Positive Bowel Sounds, Abdomen Soft, No tenderness, No organomegaly appriciated,No rebound -guarding or rigidity.  9.  No Cyanosis, Normal Skin Turgor, No Skin Rash or Bruise.  10. Good muscle tone,  joints appear normal , no effusions, Normal ROM.  11. No Palpable Lymph Nodes in Neck or Axillae     Data Review:    CBC  Recent Labs Lab 01/18/16 0852  WBC 6.4  HGB 9.6*  HCT 31.9*  PLT 251  MCV 88.1  MCH 26.5  MCHC 30.1  RDW 16.4*   ------------------------------------------------------------------------------------------------------------------  Chemistries   Recent Labs Lab 01/18/16 0852  NA 136  K 4.6  CL 105  CO2 24  GLUCOSE 176*  BUN 15  CREATININE 0.97  CALCIUM 10.1   ------------------------------------------------------------------------------------------------------------------ CrCl cannot be calculated (Unknown ideal weight.). ------------------------------------------------------------------------------------------------------------------ No results for input(s): TSH, T4TOTAL, T3FREE, THYROIDAB in the last 72 hours.  Invalid input(s): FREET3  Coagulation profile No results for input(s): INR, PROTIME in the last 168 hours. ------------------------------------------------------------------------------------------------------------------- No results for input(s): DDIMER in the last 72  hours. -------------------------------------------------------------------------------------------------------------------  Cardiac Enzymes No results for input(s): CKMB, TROPONINI, MYOGLOBIN in the last 168 hours.  Invalid input(s): CK ------------------------------------------------------------------------------------------------------------------    Component Value Date/Time   BNP 576.4 (H) 01/18/2016 0852     ---------------------------------------------------------------------------------------------------------------  Urinalysis    Component Value Date/Time   COLORURINE YELLOW 10/14/2013 1416   APPEARANCEUR CLOUDY (A) 10/14/2013 1416   LABSPEC 1.023 10/14/2013 1416   PHURINE 6.0 10/14/2013 1416   GLUCOSEU >1000 (A) 10/14/2013 1416   HGBUR NEGATIVE 10/14/2013 1416   BILIRUBINUR NEGATIVE 10/14/2013 1416   KETONESUR 15 (A) 10/14/2013 1416   PROTEINUR 30 (A) 10/14/2013 1416   UROBILINOGEN 0.2 10/14/2013 1416   NITRITE NEGATIVE 10/14/2013 1416   LEUKOCYTESUR MODERATE (A) 10/14/2013 1416    ----------------------------------------------------------------------------------------------------------------   Imaging Results:    Dg Chest 2 View  Result Date: 01/18/2016 CLINICAL DATA:  Patient is having chest pain in center and left lung region; SOB. Patient says she started having breathing problems after taking specialty medication for her hypertension (she does not know the name of the drug). EXAM: CHEST  2 VIEW COMPARISON:  09/04/2015 FINDINGS: There is new irregular interstitial thickening most evident in the central and lower lungs. No focal lung consolidation. No pleural effusion or pneumothorax. Cardiac silhouette is top-normal in size. There is central vascular congestion. No convincing mediastinal or hilar masses. Skeletal structures are demineralized but grossly intact. IMPRESSION: 1. New bilateral irregular interstitial thickening. Findings may reflect interstitial  inflammation related to the new medication. Interstitial pulmonary edema is possible as is diffuse interstitial infection. Electronically Signed   By: Lajean Manes M.D.   On: 01/18/2016 09:32   Ct Angio Chest Pe W/cm &/or Wo Cm  Result Date: 01/18/2016 CLINICAL DATA:  Shortness of breath. EXAM: CT ANGIOGRAPHY CHEST WITH CONTRAST TECHNIQUE: Multidetector CT imaging of the chest was performed using the standard protocol during bolus administration of intravenous contrast. Multiplanar CT image reconstructions and MIPs were obtained to evaluate the vascular anatomy. CONTRAST:  80 mL of Isovue 370 COMPARISON:  February 17, 2015 CT scan FINDINGS: Cardiovascular: Coronary artery calcifications are seen in the left coronary arteries. The heart size is borderline. The heart is otherwise unremarkable. No pulmonary emboli identified. Mediastinum/Nodes: Shotty nodes in the mediastinum are increased in the interval,  possibly reactive. The thoracic aorta is normal in caliber with no dissection. No other mediastinal abnormalities. No pericardial effusion. Lungs/Pleura: Central airways are normal. No pneumothorax. Scarring in the medial right apex is stable. Small moderate bilateral pleural effusions are new in the interval with associated atelectasis. Mild interlobular septal thickening. No mass or other infiltrate. Upper Abdomen: No acute abnormality. Musculoskeletal: No chest wall abnormality. No acute or significant osseous findings. Review of the MIP images confirms the above findings. IMPRESSION: 1. No pulmonary emboli. 2. Pleural effusions and mild edema. Electronically Signed   By: Dorise Bullion III M.D   On: 01/18/2016 14:52    My personal review of EKG: Rhythm NSR, Rate  71 /min, QTc 459 , no Acute ST changes   Assessment & Plan:    Active Problems:   HTN (hypertension)   DM (diabetes mellitus) (HCC)   Dyspnea   GERD (gastroesophageal reflux disease)   Restrictive lung disease   Pulmonary hypertension    Acute respiratory failure (HCC)   Chest pain    Acute hypoxic respiratory failure - Patient with significant hypoxia 84% on room air, this is most likely due to cardiac origin as CTA chest with evidence of pulmonary edema and pleural effusion,  - Even though with normal restrictive lung disease, is less likely in the evidence of CTA chest findings. - We will check 2-D echo , daily weight, strict ins and outs, will start on Lasix 40 mg IV twice a day.  Bilateral pleural effusion/pulmonary edema - Possibly related to CHF, will check 2-D echo, evidence of new CHF will consult cardiology  Chest pain - appears nontypical, reproducible by palpation, will check 2-D echo, will cycle cardiac enzymes, and will monitor on telemetry  Restrictive lung disease/pulmonary hypertension - Patient is following with Dr. Curt Jews as an outpatient, will discuss with him tomorrow for any further workup indicated - Recently started on Letairis, which she has stopped after discussion with Dr. Curt Jews  Hypertension - Continue with home medication  Diabetes mellitus - Tended to hold oral hypoglycemic agent, and will start an insulin sliding scale  GERD - Cont with PPI     DVT Prophylaxis Heparin - SCDs   AM Labs Ordered, also please review Full Orders  Family Communication: Admission, patients condition and plan of care including tests being ordered have been discussed with the patient and Husband who indicate understanding and agree with the plan and Code Status.  Code Status Full  Likely DC to  Home  Condition GUARDED    Consults called: None,  Admission status: Inpatient, as patient with acute hypoxic respiratory failure, I wouldn't expect it will take more than 2 days for workup and appropriate diuresis   Time spent in minutes : 65 minutes   Rainier Feuerborn M.D on 01/18/2016 at 3:55 PM  Between 7am to 7pm - Pager - [phone]. After 7pm go to [email or web address] - password [facility]  [facility] - Office  [phone]

## 2016-01-18 NOTE — ED Provider Notes (Signed)
[facility] DEPT Provider Note   CSN: [id number] Arrival date & time: 01/18/16  [id number]     History   Chief Complaint Chief Complaint  Patient presents with  . Shortness of Breath    HPI Ann Hunter is a 70 y.o. female who presents with acute worsening of her baseline SOB and chest pressure. PMH significant for pulmonary HTN, restrictive lung disease, type 2 DM, HTN, HLD. She is normally SOB due to her lung disease however it acutely became worse last night. She was having difficulty lying down Her pulmonologist is Dr. Lake Bells. She was started on Letairis over a week ago. She has been taking this medicine however it made her more SOB. She called Dr. Anastasia Pall office and they told her they could stop this medicine which she did 3 days ago. She has been worked up extensively her lung disease. She had a cath in May 2017 which was negative for CAD but showed elevated pulmonary artery pressure. She had a EMG study done to f/o a neuromuscular junction disorder or myopathy which was normal. The cause of her restrictive lung disease is still unclear per Dr. Anastasia Pall note. No evidence of ILD. She has had autoimmune work up which showed slightly elevated ANA. She had a sleep study in July which showed that she desats therefore was recommended she was on O2 at night. She does not use O2 because it makes her feel worse.  Reports associated chest pressure which is intermittent, mild, recurrent. Denies fever, chills, cough, palpitations, leg swelling.   HPI  Past Medical History:  Diagnosis Date  . B12 deficiency   . Bladder cancer ([place])   . Closed head injury   . Essential hypertension, benign   . GERD (gastroesophageal reflux disease)   . Head injury, unspecified   . Memory difficulty    d/t head injury  . Osteoporosis, unspecified   . Paroxysmal atrial fibrillation (H[facility])    during septic shock, none since  . Pulmonary hypertension   . Pure hypercholesterolemia   . Type II or unspecified  type diabetes mellitus without mention of complication, uncontrolled   . Unspecified vitamin D deficiency     Patient Active Problem List   Diagnosis Date Noted  . Restrictive lung disease 09/23/2015  . Pulmonary hypertension 09/23/2015  . Abnormal stress test 08/13/2015  . Dyspnea and respiratory abnormality 02/12/2015  . Bibasilar crackles 02/12/2015  . Hyponatremia 10/16/2013  . GERD (gastroesophageal reflux disease) 10/16/2013  . SIRS (systemic inflammatory response syndrome) ([place]) 10/16/2013  . Acute renal failure ([place]) 10/16/2013  . Pyelonephritis 10/14/2013  . Acute pyelonephritis 10/14/2013  . Dyspnea 01/21/2012  . PAF (paroxysmal atrial fibrillation) ([place]) 12/18/2010  . HTN (hypertension) 12/18/2010  . Mixed hyperlipidemia 12/18/2010  . DM (diabetes mellitus) ([place]) 12/18/2010    Past Surgical History:  Procedure Laterality Date  . APPENDECTOMY  1981  . CARDIAC CATHETERIZATION    . CARDIAC CATHETERIZATION N/A 08/13/2015   Procedure: Right/Left Heart Cath and Coronary Angiography;  Surgeon: Peter M Martinique, MD;  Location: [facility];  Service: Cardiovascular;  Laterality: N/A;  . TONSILLECTOMY    . TOTAL ABDOMINAL HYSTERECTOMY  1981    OB History    No data available       Home Medications    Prior to Admission medications   Medication Sig Start Date End Date Taking? Authorizing Provider  ambrisentan (LETAIRIS) 5 MG tablet Take 1 tablet (5 mg total) by mouth daily. 12/11/15   Juanito Doom, MD  carvedilol (COREG) 25 MG tablet TAKE 1 TABLET TWICE A DAY FOR 90 DAY(S) 10/01/15   Historical Provider, MD  glimepiride (AMARYL) 4 MG tablet Take 4 mg by mouth 2 (two) times daily.     Historical Provider, MD  hydrALAZINE (APRESOLINE) 50 MG tablet Take 50 mg by mouth 2 (two) times daily. 01/25/15   Historical Provider, MD  losartan (COZAAR) 100 MG tablet Take 100 mg by mouth daily.      Historical Provider, MD  metFORMIN (GLUCOPHAGE) 1000 MG tablet Take 1,000 mg  by mouth 2 (two) times daily with a meal.    Historical Provider, MD  pravastatin (PRAVACHOL) 20 MG tablet Take 20 mg by mouth daily.  11/29/11   Historical Provider, MD    Family History Family History  Problem Relation Age of Onset  . Coronary artery disease Mother     MI  . Heart disease Father     CHF  . Diabetes Sister   . Diabetes Sister   . Thyroid disease Sister   . Arthritis Sister   . Diabetes Sister     Social History Social History  Substance Use Topics  . Smoking status: Never Smoker  . Smokeless tobacco: Never Used  . Alcohol use No     Allergies   Advil [ibuprofen]; Codeine; Ranitidine; Sitagliptin; Ace inhibitors; Aleve [naproxen sodium]; Boniva [ibandronic acid]; Clonidine derivatives; Januvia [sitagliptin phosphate]; Penicillins; and Sulfa antibiotics   Review of Systems Review of Systems  Constitutional: Negative for chills, diaphoresis and fever.  Respiratory: Positive for shortness of breath. Negative for cough and wheezing.   Cardiovascular: Positive for chest pain. Negative for palpitations and leg swelling.  Gastrointestinal: Negative for abdominal pain, nausea and vomiting.  All other systems reviewed and are negative.    Physical Exam Updated Vital Signs BP 189/76   Pulse 69   Temp 98 F (36.7 C)   Resp 17   SpO2 95%   Physical Exam  Constitutional: She is oriented to person, place, and time. She appears well-developed and well-nourished. No distress.  On 2L [place]  HENT:  Head: Normocephalic and atraumatic.  Eyes: Conjunctivae are normal. Pupils are equal, round, and reactive to light. Right eye exhibits no discharge. Left eye exhibits no discharge. No scleral icterus.  Neck: Normal range of motion. Neck supple.  Cardiovascular: Normal rate and regular rhythm.  Exam reveals no gallop and no friction rub.   No murmur heard. Pulmonary/Chest: Effort normal. No accessory muscle usage. No tachypnea. No respiratory distress. She has no  decreased breath sounds. She has no wheezes. She has no rhonchi. She has no rales. She exhibits no tenderness.  Bibasilar crackles  Abdominal: Soft. Bowel sounds are normal. She exhibits no distension. There is no tenderness.  Musculoskeletal: She exhibits no edema.  No swelling  Neurological: She is alert and oriented to person, place, and time.  Skin: Skin is warm and dry.  Psychiatric: She has a normal mood and affect. Her behavior is normal.  Nursing note and vitals reviewed.    ED Treatments / Results  Labs (all labs ordered are listed, but only abnormal results are displayed) Labs Reviewed  BASIC METABOLIC PANEL - Abnormal; Notable for the following:       Result Value   Glucose, Bld 176 (*)    GFR calc non Af Amer 57 (*)    All other components within normal limits  CBC - Abnormal; Notable for the following:    RBC 3.62 (*)  Hemoglobin 9.6 (*)    HCT 31.9 (*)    RDW 16.4 (*)    All other components within normal limits  BRAIN NATRIURETIC PEPTIDE - Abnormal; Notable for the following:    B Natriuretic Peptide 576.4 (*)    All other components within normal limits  CBG MONITORING, ED - Abnormal; Notable for the following:    Glucose-Capillary 130 (*)    All other components within normal limits  I-STAT TROPOININ, ED  POC OCCULT BLOOD, ED    EKG  EKG Interpretation  Date/Time:  Sunday January 18 2016 11:40:46 EDT Ventricular Rate:  71 PR Interval:    QRS Duration: 100 QT Interval:  422 QTC Calculation: 459 R Axis:   40 Text Interpretation:  Sinus rhythm Minimal ST depression, lateral leads Minimal ST elevation, inferior leads no reciprocal changes Confirmed by GOLDSTON MD, SCOTT [phone]) on 01/18/2016 11:43:28 AM       Radiology Dg Chest 2 View  Result Date: 01/18/2016 CLINICAL DATA:  Patient is having chest pain in center and left lung region; SOB. Patient says she started having breathing problems after taking specialty medication for her hypertension (she  does not know the name of the drug). EXAM: CHEST  2 VIEW COMPARISON:  09/04/2015 FINDINGS: There is new irregular interstitial thickening most evident in the central and lower lungs. No focal lung consolidation. No pleural effusion or pneumothorax. Cardiac silhouette is top-normal in size. There is central vascular congestion. No convincing mediastinal or hilar masses. Skeletal structures are demineralized but grossly intact. IMPRESSION: 1. New bilateral irregular interstitial thickening. Findings may reflect interstitial inflammation related to the new medication. Interstitial pulmonary edema is possible as is diffuse interstitial infection. Electronically Signed   By: Lajean Manes M.D.   On: 01/18/2016 09:32   Ct Angio Chest Pe W/cm &/or Wo Cm  Result Date: 01/18/2016 CLINICAL DATA:  Shortness of breath. EXAM: CT ANGIOGRAPHY CHEST WITH CONTRAST TECHNIQUE: Multidetector CT imaging of the chest was performed using the standard protocol during bolus administration of intravenous contrast. Multiplanar CT image reconstructions and MIPs were obtained to evaluate the vascular anatomy. CONTRAST:  80 mL of Isovue 370 COMPARISON:  February 17, 2015 CT scan FINDINGS: Cardiovascular: Coronary artery calcifications are seen in the left coronary arteries. The heart size is borderline. The heart is otherwise unremarkable. No pulmonary emboli identified. Mediastinum/Nodes: Shotty nodes in the mediastinum are increased in the interval, possibly reactive. The thoracic aorta is normal in caliber with no dissection. No other mediastinal abnormalities. No pericardial effusion. Lungs/Pleura: Central airways are normal. No pneumothorax. Scarring in the medial right apex is stable. Small moderate bilateral pleural effusions are new in the interval with associated atelectasis. Mild interlobular septal thickening. No mass or other infiltrate. Upper Abdomen: No acute abnormality. Musculoskeletal: No chest wall abnormality. No acute or  significant osseous findings. Review of the MIP images confirms the above findings. IMPRESSION: 1. No pulmonary emboli. 2. Pleural effusions and mild edema. Electronically Signed   By: Dorise Bullion III M.D   On: 01/18/2016 14:52    Procedures Procedures (including critical care time)  Medications Ordered in ED Medications  aspirin chewable tablet 324 mg (not administered)  furosemide (LASIX) injection 40 mg (40 mg Intravenous Given 01/18/16 1319)  iopamidol (ISOVUE-370) 76 % injection (80 mLs  Contrast Given 01/18/16 1425)  LORazepam (ATIVAN) injection 0.5 mg (0.5 mg Intravenous Given 01/18/16 1421)     Initial Impression / Assessment and Plan / ED Course  I have reviewed the triage  vital signs and the nursing notes.  Pertinent labs & imaging results that were available during my care of the patient were reviewed by me and considered in my medical decision making (see chart for details).  Clinical Course   70 year old female presents with acute respiratory failure with hypoxia. Patient is afebrile, not tachycardic or tachypneic, normotensive. Sats are 84% on RA when [place] was removed. CBC remarkable for worsening anemia. BMP remarkable for hyperglycemia. BNP is 576.4. EKG is SR. Troponin is 0.01. CXR is remarkable for interstitial thickening vs pulmonary edema. CTA obtained to further characterized and r/o PE. CTA remarkable for moderate pleural effusions and mild edema. IV Lasix given. She is also complaining of chest pain. Seems to be mild and atypical.   Shared visit with Dr. Morton Amy. Spoke with Dr. Waldron Labs who will admit patient to inpatient tele. Appreciate assistance.   Final Clinical Impressions(s) / ED Diagnoses   Final diagnoses:  Hypoxia  Dyspnea, unspecified type  Pleural effusion, bilateral    New Prescriptions New Prescriptions   No medications on file     Recardo Evangelist, PA-C 01/18/16 2108    Sherwood Gambler, MD 01/23/16 1752

## 2016-01-19 ENCOUNTER — Telehealth: Payer: Self-pay | Admitting: Pulmonary Disease

## 2016-01-19 ENCOUNTER — Encounter (HOSPITAL_COMMUNITY): Payer: Self-pay | Admitting: General Practice

## 2016-01-19 DIAGNOSIS — J90 Pleural effusion, not elsewhere classified: Secondary | ICD-10-CM

## 2016-01-19 DIAGNOSIS — K219 Gastro-esophageal reflux disease without esophagitis: Secondary | ICD-10-CM

## 2016-01-19 LAB — COMPREHENSIVE METABOLIC PANEL
ALBUMIN: 3.4 g/dL — AB (ref 3.5–5.0)
ALK PHOS: 36 U/L — AB (ref 38–126)
ALT: 14 U/L (ref 14–54)
ANION GAP: 7 (ref 5–15)
AST: 14 U/L — ABNORMAL LOW (ref 15–41)
BUN: 16 mg/dL (ref 6–20)
CHLORIDE: 98 mmol/L — AB (ref 101–111)
CO2: 31 mmol/L (ref 22–32)
Calcium: 9.8 mg/dL (ref 8.9–10.3)
Creatinine, Ser: 1.35 mg/dL — ABNORMAL HIGH (ref 0.44–1.00)
GFR calc non Af Amer: 38 mL/min — ABNORMAL LOW (ref >60)
GFR, EST AFRICAN AMERICAN: 45 mL/min — AB (ref >60)
GLUCOSE: 126 mg/dL — AB (ref 65–99)
POTASSIUM: 3.8 mmol/L (ref 3.5–5.1)
SODIUM: 136 mmol/L (ref 135–145)
Total Bilirubin: 0.8 mg/dL (ref 0.3–1.2)
Total Protein: 6 g/dL — ABNORMAL LOW (ref 6.5–8.1)

## 2016-01-19 LAB — GLUCOSE, CAPILLARY
Glucose-Capillary: 158 mg/dL — ABNORMAL HIGH (ref 65–99)
Glucose-Capillary: 168 mg/dL — ABNORMAL HIGH (ref 65–99)
Glucose-Capillary: 181 mg/dL — ABNORMAL HIGH (ref 65–99)
Glucose-Capillary: 126 mg/dL — ABNORMAL HIGH (ref 65–99)

## 2016-01-19 LAB — TROPONIN I
Troponin I: <0.03 ng/mL (ref <0.03)
Troponin I: <0.03 ng/mL (ref <0.03)

## 2016-01-19 MED ORDER — DIPHENHYDRAMINE HCL 25 MG PO CAPS: 25.0000 mg | ORAL_CAPSULE | Freq: Four times a day (QID) | ORAL | Status: DC | PRN

## 2016-01-19 NOTE — Progress Notes (Signed)
PROGRESS NOTE [facility]   Laycee Numbers   [id number] DOB: 1945-07-11  DOA: 01/18/2016 PCP: Lillard Anes, MD   Brief Narrative:    Ann Hunter  is a 70 y.o. female, With past medical history of pulmonary hypertension, restrictive lung disease, followed by Dr. Curt Jews, type 2 diabetes, hypertension, hyperlipidemia, patient has been followed with pulmonary for chronic dyspnea, workup significant for restrictive lung disease, but no evidence of ILD or other cause, patient reports progressive dyspnea over last 10 days, she was recently started on Letairis for pulmonary hypertension, patient reports she had diffuse extra pillow and wedge to sleep recently, denies any cough, productive sputum, fever chills or lower extremity edema. As well she reports chest pressure midsternal, intermittent, accompanied by her dyspnea, but was reproducible by palpation on my exam, as discussing with the ED physician, patient hypoxic 84% on room air. - In ED, workup significant for hypoxia 84% on room air by ED physician, CTA chest significant for pulmonary edema and bilateral pleural effusion  Subjective:  Seen and examined this am. Breathing has improved, patient upset because her tongue was swollen and benadryl was not given. Denies chest pain, SOB , dizziness and palpitation.    Assessment & Plan:    Acute hypoxic respiratory failure - Resolved, unclear if part of restrictive lund diseases vs new CHF diagnosis - CTA chest with evidence of pulmonary edema and pleural effusion,  - We will check 2-D echo , daily weight, strict ins and outs, continue Lasix 40 mg IV twice a day, repeat CXR in AM.  Bilateral pleural effusion/pulmonary edema - Possibly related to CHF, will check 2-D echo, if evidence of new CHF will consult cardiology  Chest pain - resolved  - appears nontypical, reproducible by palpation,  - Pain management PRN   Restrictive lung disease/pulmonary hypertension - Dr  Lake Bells office called and left message  - Recently started on Letairis, stopped by Dr. Lake Bells   Hypertension slight elevated  - Started on coreg, if no improvement consider Norvasc  - Continue with home medication - Monito BP  Diabetes mellitus - SSI - Monitor CBGs  GERD - Cont with PPI  DVT prophylaxis: SCDs Code Status: Full Family Communication: Husband at bedside, case discussed in details  Disposition Plan: Anticipate discharge home once medically stable   Consultants:   None   Procedures:   ECHO  Antimicrobials:  None    Objective: Vitals:   01/19/16 0424 01/19/16 0841 01/19/16 1157 01/19/16 1620  BP: (!) 169/62 (!) 141/50 (!) 173/59 (!) 168/56  Pulse: 79  66 68  Resp: 18  18 18   Temp: 98.4 F (36.9 C)  97.7 F (36.5 C) 97.3 F (36.3 C)  TempSrc: Oral  Oral Oral  SpO2: 95%  96% 95%  Weight: 65.2 kg (143 lb 12.8 oz)     Height:        Intake/Output Summary (Last 24 hours) at 01/19/16 1634 Last data filed at 01/19/16 1536  Gross per 24 hour  Intake              720 ml  Output             4150 ml  Net            -3430 ml   Filed Weights   01/18/16 1851 01/19/16 0424  Weight: 67.1 kg (147 lb 14.9 oz) 65.2 kg (143 lb 12.8 oz)    Examination:  General exam: Appears calm and comfortable  HEENT: AC/AT, PERRLA,  OP moist and clear Respiratory system: Clear to auscultation. B/l crackles at the bases, no wheezes  Cardiovascular system: S1 & S2 heard, RRR. No JVD, murmurs, rubs or gallops Gastrointestinal system: Abdomen is nondistended, soft and nontender. No organomegaly or masses felt.  Central nervous system: Alert and oriented. No focal neurological deficits. Extremities: No pedal edema. Symmetric, strength 5/5   Skin: No rashes, lesions or ulcers Psychiatry: Judgement and insight appear normal. Mood & affect appropriate.    Data Reviewed: I have personally reviewed following labs and imaging studies  CBC:  Recent Labs Lab 01/18/16 0852   WBC 6.4  HGB 9.6*  HCT 31.9*  MCV 88.1  PLT 123XX123   Basic Metabolic Panel:  Recent Labs Lab 01/18/16 0852 01/19/16 0035  NA 136 136  K 4.6 3.8  CL 105 98*  CO2 24 31  GLUCOSE 176* 126*  BUN 15 16  CREATININE 0.97 1.35*  CALCIUM 10.1 9.8   GFR: Estimated Creatinine Clearance: 33.7 mL/min (by C-G formula based on SCr of 1.35 mg/dL (H)). Liver Function Tests:  Recent Labs Lab 01/19/16 0035  AST 14*  ALT 14  ALKPHOS 36*  BILITOT 0.8  PROT 6.0*  ALBUMIN 3.4*   No results for input(s): LIPASE, AMYLASE in the last 168 hours. No results for input(s): AMMONIA in the last 168 hours. Coagulation Profile: No results for input(s): INR, PROTIME in the last 168 hours. Cardiac Enzymes:  Recent Labs Lab 01/18/16 1927 01/19/16 0035 01/19/16 0712  TROPONINI <0.03 <0.03 <0.03   BNP (last 3 results) No results for input(s): PROBNP in the last 8760 hours. HbA1C: No results for input(s): HGBA1C in the last 72 hours. CBG:  Recent Labs Lab 01/18/16 1129 01/18/16 2024 01/19/16 0702 01/19/16 1159 01/19/16 1622  GLUCAP 130* 125* 158* 168* 181*   Lipid Profile: No results for input(s): CHOL, HDL, LDLCALC, TRIG, CHOLHDL, LDLDIRECT in the last 72 hours. Thyroid Function Tests: No results for input(s): TSH, T4TOTAL, FREET4, T3FREE, THYROIDAB in the last 72 hours. Anemia Panel: No results for input(s): VITAMINB12, FOLATE, FERRITIN, TIBC, IRON, RETICCTPCT in the last 72 hours. Sepsis Labs: No results for input(s): PROCALCITON, LATICACIDVEN in the last 168 hours.  No results found for this or any previous visit (from the past 240 hour(s)).    Radiology Studies: Dg Chest 2 View  Result Date: 01/18/2016 CLINICAL DATA:  Patient is having chest pain in center and left lung region; SOB. Patient says she started having breathing problems after taking specialty medication for her hypertension (she does not know the name of the drug). EXAM: CHEST  2 VIEW COMPARISON:  09/04/2015  FINDINGS: There is new irregular interstitial thickening most evident in the central and lower lungs. No focal lung consolidation. No pleural effusion or pneumothorax. Cardiac silhouette is top-normal in size. There is central vascular congestion. No convincing mediastinal or hilar masses. Skeletal structures are demineralized but grossly intact. IMPRESSION: 1. New bilateral irregular interstitial thickening. Findings may reflect interstitial inflammation related to the new medication. Interstitial pulmonary edema is possible as is diffuse interstitial infection. Electronically Signed   By: Lajean Manes M.D.   On: 01/18/2016 09:32   Ct Angio Chest Pe W/cm &/or Wo Cm  Result Date: 01/18/2016 CLINICAL DATA:  Shortness of breath. EXAM: CT ANGIOGRAPHY CHEST WITH CONTRAST TECHNIQUE: Multidetector CT imaging of the chest was performed using the standard protocol during bolus administration of intravenous contrast. Multiplanar CT image reconstructions and MIPs were obtained to evaluate the vascular anatomy. CONTRAST:  80  mL of Isovue 370 COMPARISON:  February 17, 2015 CT scan FINDINGS: Cardiovascular: Coronary artery calcifications are seen in the left coronary arteries. The heart size is borderline. The heart is otherwise unremarkable. No pulmonary emboli identified. Mediastinum/Nodes: Shotty nodes in the mediastinum are increased in the interval, possibly reactive. The thoracic aorta is normal in caliber with no dissection. No other mediastinal abnormalities. No pericardial effusion. Lungs/Pleura: Central airways are normal. No pneumothorax. Scarring in the medial right apex is stable. Small moderate bilateral pleural effusions are new in the interval with associated atelectasis. Mild interlobular septal thickening. No mass or other infiltrate. Upper Abdomen: No acute abnormality. Musculoskeletal: No chest wall abnormality. No acute or significant osseous findings. Review of the MIP images confirms the above  findings. IMPRESSION: 1. No pulmonary emboli. 2. Pleural effusions and mild edema. Electronically Signed   By: Dorise Bullion III M.D   On: 01/18/2016 14:52      Scheduled Meds: . aspirin EC  81 mg Oral Daily  . carvedilol  25 mg Oral BID WC  . furosemide  40 mg Intravenous BID  . heparin  5,000 Units Subcutaneous Q8H  . hydrALAZINE  50 mg Oral BID  . insulin aspart  0-5 Units Subcutaneous QHS  . insulin aspart  0-9 Units Subcutaneous TID WC  . losartan  100 mg Oral [id number]  . pantoprazole  40 mg Oral Daily  . pravastatin  20 mg Oral QPM  . sodium chloride flush  3 mL Intravenous Q12H   Continuous Infusions:    LOS: 1 day   Chipper Oman, MD [facility] Pager [phone]  If 7PM-7AM, please contact night-coverage [email or web address] Password [email or web address] 01/19/2016, 4:34 PM

## 2016-01-19 NOTE — Telephone Encounter (Signed)
thanks

## 2016-01-19 NOTE — Progress Notes (Signed)
Pt c/o tongue swelling, this RN made an assessment, left side of the tongue is a little swollen, pt verbalized that she have multiple allergy  to medications, pt asking benadryl, no prn med ordered. Lamar Blinks  NP notified, awaiting call back at this time. Will continue to monitor.

## 2016-01-19 NOTE — Telephone Encounter (Signed)
Per chart notes pt is @ [facility]

## 2016-01-19 NOTE — Progress Notes (Signed)
Pt requesting PRN benadryl just in case she has an allergic rxn to medication.  Pt says she is sensitive to medications. MD paged.

## 2016-01-19 NOTE — Telephone Encounter (Signed)
Which hospital?

## 2016-01-19 NOTE — Telephone Encounter (Signed)
Dr. Quincy Simmonds calling to make BQ aware that pt was admitted yesterday for hypoxia, b/l pleural effusion.   Forwarding to BQ as FYI.

## 2016-01-20 ENCOUNTER — Inpatient Hospital Stay (HOSPITAL_COMMUNITY): Payer: Medicare Other

## 2016-01-20 DIAGNOSIS — R0600 Dyspnea, unspecified: Secondary | ICD-10-CM

## 2016-01-20 LAB — BASIC METABOLIC PANEL
Anion gap: 11 (ref 5–15)
BUN: 24 mg/dL — AB (ref 6–20)
CALCIUM: 9.8 mg/dL (ref 8.9–10.3)
CO2: 31 mmol/L (ref 22–32)
Chloride: 94 mmol/L — ABNORMAL LOW (ref 101–111)
Creatinine, Ser: 1.34 mg/dL — ABNORMAL HIGH (ref 0.44–1.00)
GFR calc Af Amer: 45 mL/min — ABNORMAL LOW (ref >60)
GFR, EST NON AFRICAN AMERICAN: 39 mL/min — AB (ref >60)
GLUCOSE: 180 mg/dL — AB (ref 65–99)
Potassium: 3.3 mmol/L — ABNORMAL LOW (ref 3.5–5.1)
SODIUM: 136 mmol/L (ref 135–145)

## 2016-01-20 LAB — CBC WITH DIFFERENTIAL/PLATELET
BASOS ABS: 0 10*3/uL (ref 0.0–0.1)
BASOS PCT: 1 %
EOS ABS: 0.3 10*3/uL (ref 0.0–0.7)
EOS PCT: 4 %
HCT: 31.5 % — ABNORMAL LOW (ref 36.0–46.0)
Hemoglobin: 9.7 g/dL — ABNORMAL LOW (ref 12.0–15.0)
Lymphocytes Relative: 16 %
Lymphs Abs: 1.1 10*3/uL (ref 0.7–4.0)
MCH: 27.1 pg (ref 26.0–34.0)
MCHC: 30.8 g/dL (ref 30.0–36.0)
MCV: 88 fL (ref 78.0–100.0)
MONO ABS: 0.9 10*3/uL (ref 0.1–1.0)
Monocytes Relative: 12 %
Neutro Abs: 4.8 10*3/uL (ref 1.7–7.7)
Neutrophils Relative %: 67 %
PLATELETS: 273 10*3/uL (ref 150–400)
RBC: 3.58 MIL/uL — ABNORMAL LOW (ref 3.87–5.11)
RDW: 16.3 % — AB (ref 11.5–15.5)
WBC: 7.1 10*3/uL (ref 4.0–10.5)

## 2016-01-20 LAB — ECHOCARDIOGRAM COMPLETE
HEIGHTINCHES: 64.5 in
WEIGHTICAEL: 2257.6 [oz_av]

## 2016-01-20 LAB — GLUCOSE, CAPILLARY
GLUCOSE-CAPILLARY: 179 mg/dL — AB (ref 65–99)
Glucose-Capillary: 222 mg/dL — ABNORMAL HIGH (ref 65–99)
Glucose-Capillary: 120 mg/dL — ABNORMAL HIGH (ref 65–99)
Glucose-Capillary: 251 mg/dL — ABNORMAL HIGH (ref 65–99)

## 2016-01-20 MED ORDER — POTASSIUM CHLORIDE CRYS ER 20 MEQ PO TBCR
40.0000 meq | EXTENDED_RELEASE_TABLET | Freq: Two times a day (BID) | ORAL | Status: DC
  Administered 2016-01-20 – 2016-01-21 (×3): 40 meq via ORAL
  Filled 2016-01-20 (×5): qty 2

## 2016-01-20 NOTE — Progress Notes (Signed)
*  PRELIMINARY RESULTS* Echocardiogram 2D Echocardiogram has been performed.  Ann Hunter 01/20/2016, 3:22 PM

## 2016-01-21 ENCOUNTER — Encounter (HOSPITAL_COMMUNITY): Payer: Self-pay | Admitting: Internal Medicine

## 2016-01-21 DIAGNOSIS — I5031 Acute diastolic (congestive) heart failure: Secondary | ICD-10-CM

## 2016-01-21 DIAGNOSIS — J90 Pleural effusion, not elsewhere classified: Secondary | ICD-10-CM

## 2016-01-21 DIAGNOSIS — J9601 Acute respiratory failure with hypoxia: Secondary | ICD-10-CM

## 2016-01-21 DIAGNOSIS — J9600 Acute respiratory failure, unspecified whether with hypoxia or hypercapnia: Secondary | ICD-10-CM

## 2016-01-21 DIAGNOSIS — N179 Acute kidney failure, unspecified: Secondary | ICD-10-CM

## 2016-01-21 HISTORY — DX: Acute diastolic (congestive) heart failure: I50.31

## 2016-01-21 LAB — GLUCOSE, CAPILLARY
GLUCOSE-CAPILLARY: 179 mg/dL — AB (ref 65–99)
GLUCOSE-CAPILLARY: 265 mg/dL — AB (ref 65–99)
GLUCOSE-CAPILLARY: 190 mg/dL — AB (ref 65–99)
GLUCOSE-CAPILLARY: 198 mg/dL — AB (ref 65–99)

## 2016-01-21 LAB — BASIC METABOLIC PANEL
ANION GAP: 12 (ref 5–15)
BUN: 31 mg/dL — ABNORMAL HIGH (ref 6–20)
CALCIUM: 10.1 mg/dL (ref 8.9–10.3)
CO2: 28 mmol/L (ref 22–32)
Chloride: 94 mmol/L — ABNORMAL LOW (ref 101–111)
Creatinine, Ser: 1.69 mg/dL — ABNORMAL HIGH (ref 0.44–1.00)
GFR, EST AFRICAN AMERICAN: 34 mL/min — AB (ref >60)
GFR, EST NON AFRICAN AMERICAN: 29 mL/min — AB (ref >60)
GLUCOSE: 272 mg/dL — AB (ref 65–99)
POTASSIUM: 4.4 mmol/L (ref 3.5–5.1)
SODIUM: 134 mmol/L — AB (ref 135–145)

## 2016-01-21 MED ORDER — CARVEDILOL 25 MG PO TABS
25.0000 mg | ORAL_TABLET | Freq: Two times a day (BID) | ORAL | Status: DC
  Administered 2016-01-22: 25 mg via ORAL
  Filled 2016-01-21 (×2): qty 1

## 2016-01-21 NOTE — Progress Notes (Addendum)
Inpatient Diabetes Program Recommendations  AACE/ADA: New Consensus Statement on Inpatient Glycemic Control (2015)  Target Ranges:  Prepandial:   less than 140 mg/dL      Peak postprandial:   less than 180 mg/dL (1-2 hours)      Critically ill patients:  140 - 180 mg/dL   Results for SHAKIARA, WORMSER (MRN [id number]) as of 01/21/2016 10:16  Ref. Range 01/20/2016 05:46 01/20/2016 11:19 01/20/2016 16:45 01/20/2016 21:00 01/21/2016 05:46  Glucose-Capillary Latest Ref Range: 65 - 99 mg/dL 179 (H) 222 (H) 120 (H) 251 (H) 179 (H)    Review of Glycemic Control  Diabetes history: DM2 (modified diet to carb modified) Outpatient Diabetes medications: Metformin 1000 mg BID, Amaryl 4 mg BID Current orders for Inpatient glycemic control: Novolog 0-9 units TIDAC and 0-5 units QHS  Inpatient Diabetes Program Recommendations: Please consider:  Adding meal coverage of 3 units TIDAC if patient eats > 50% of meal;  Ordering A1C.   Thank you,  Windy Carina, RN, BSN Diabetes Coordinator Inpatient Diabetes Program [phone] (Team Pager) [phone] ([facility] office) [phone] [facility] office) [phone] [facility] office)

## 2016-01-21 NOTE — Progress Notes (Signed)
PROGRESS NOTE  Ann Hunter [id number] DOB: October 19, 1945 DOA: 01/18/2016 PCP: Lillard Anes, MD  Brief History:  70 y.o.female,With past medical history of pulmonary hypertension, restrictive lung disease, followed by Dr. Lake Bells, type 2 diabetes, hypertension, hyperlipidemia, patient has been followed with pulmonary for chronic dyspnea, workup significant for restrictive lung disease, but no evidence of ILD or other cause, patient reports progressive dyspnea over last 10 days, she was recently started on Letairis for pulmonary hypertension. She denies any cough, productive sputum, fever chills or lower extremity edema.As well she reports chest pressure midsternal, intermittent, accompanied by her dyspnea.  She was told to stop her Letairis due to her fatigue and sob prior to admission. - In ED, workup significant for hypoxia 84% on room air by ED physician, CTAchest significant for pulmonary edema and bilateral pleural effusion  Assessment/Plan: Acute hypoxic respiratory failure -Secondary to pleural effusions and pulmonary edema -Resolved - 10/81/7 CTA chest with evidence of pulmonary edema and pleural effusion,  - Presently stable on room air  Bilateral pleural effusion/pulmonary edema--acute diastolic CHF - ?related to Letairis -01/20/2016 echo EF 55-6 percent, grade 2 DD, trace MR/TR -NEG 4.7 L for the admission -d/c lasix due to increasing serum creatinine  Chest pain - resolved  - appears nontypical, reproducible by palpation,  - Pain management PRN   AKI -due to diuresis -hold IV lasix -am BMP  Restrictive lung disease/pulmonary hypertension - Dr Lake Bells office called and left message  - Recently started on Letairis, stopped by Dr. Lake Bells   Hypertension slight elevated  - Started on coreg, if no improvement consider Norvasc  - Continue with home medication - [place] BP  Diabetes mellitus - SSI - Monitor CBGs -will not restart metformin  due to AKI -A1C  GERD - Cont with PPI   Disposition Plan:   Home 10/12 if renal function stable Family Communication:   Husband updated at bedside 01/21/16--Total time spent 35 minutes.  Greater than 50% spent face to face counseling and coordinating care.   Consultants:  none  Code Status:  FULL  DVT Prophylaxis:  [place] Heparin   Procedures: As Listed in Progress Note Above  Antibiotics: None    Subjective: Patient denies fevers, chills, headache, chest pain, dyspnea, nausea, vomiting, diarrhea, abdominal pain, dysuria, hematuria, hematochezia, and melena.   Objective: Vitals:   01/20/16 2001 01/21/16 0000 01/21/16 0356 01/21/16 1132  BP: (!) 138/45 (!) 126/45 137/67 133/60  Pulse: 67 66 84 64  Resp: 18 16 18 18   Temp: 98.2 F (36.8 C) 98.5 F (36.9 C) 97.5 F (36.4 C) 98.6 F (37 C)  TempSrc: Oral Oral Oral Oral  SpO2: 94% 95% 96% 96%  Weight:   63.5 kg (140 lb)   Height:        Intake/Output Summary (Last 24 hours) at 01/21/16 1820 Last data filed at 01/21/16 1340  Gross per 24 hour  Intake              960 ml  Output             2552 ml  Net            -1592 ml   Weight change: -0.499 kg (-1 lb 1.6 oz) Exam:   General:  Pt is alert, follows commands appropriately, not in acute distress  HEENT: No icterus, No thrush, No neck mass, [place]/AT  Cardiovascular: RRR, S1/S2, no rubs, no gallops  Respiratory: CTA bilaterally, no wheezing,  no crackles, no rhonchi  Abdomen: Soft/+BS, non tender, non distended, no guarding  Extremities: No edema, No lymphangitis, No petechiae, No rashes, no synovitis   Data Reviewed: I have personally reviewed following labs and imaging studies Basic Metabolic Panel:  Recent Labs Lab 01/18/16 0852 01/19/16 0035 01/20/16 0222 01/21/16 1525  NA 136 136 136 134*  K 4.6 3.8 3.3* 4.4  CL 105 98* 94* 94*  CO2 24 31 31 28   GLUCOSE 176* 126* 180* 272*  BUN 15 16 24* 31*  CREATININE 0.97 1.35* 1.34* 1.69*  CALCIUM  10.1 9.8 9.8 10.1   Liver Function Tests:  Recent Labs Lab 01/19/16 0035  AST 14*  ALT 14  ALKPHOS 36*  BILITOT 0.8  PROT 6.0*  ALBUMIN 3.4*   No results for input(s): LIPASE, AMYLASE in the last 168 hours. No results for input(s): AMMONIA in the last 168 hours. Coagulation Profile: No results for input(s): INR, PROTIME in the last 168 hours. CBC:  Recent Labs Lab 01/18/16 0852 01/20/16 0222  WBC 6.4 7.1  NEUTROABS  --  4.8  HGB 9.6* 9.7*  HCT 31.9* 31.5*  MCV 88.1 88.0  PLT 251 273   Cardiac Enzymes:  Recent Labs Lab 01/18/16 1927 01/19/16 0035 01/19/16 0712  TROPONINI <0.03 <0.03 <0.03   BNP: Invalid input(s): POCBNP CBG:  Recent Labs Lab 01/20/16 1645 01/20/16 2100 01/21/16 0546 01/21/16 1130 01/21/16 1630  GLUCAP 120* 251* 179* 265* 190*   HbA1C: No results for input(s): HGBA1C in the last 72 hours. Urine analysis:    Component Value Date/Time   COLORURINE YELLOW 10/14/2013 1416   APPEARANCEUR CLOUDY (A) 10/14/2013 1416   LABSPEC 1.023 10/14/2013 1416   PHURINE 6.0 10/14/2013 1416   GLUCOSEU >1000 (A) 10/14/2013 1416   HGBUR NEGATIVE 10/14/2013 1416   BILIRUBINUR NEGATIVE 10/14/2013 1416   KETONESUR 15 (A) 10/14/2013 1416   PROTEINUR 30 (A) 10/14/2013 1416   UROBILINOGEN 0.2 10/14/2013 1416   NITRITE NEGATIVE 10/14/2013 1416   LEUKOCYTESUR MODERATE (A) 10/14/2013 1416   Sepsis Labs: @LABRCNTIP (procalcitonin:4,lacticidven:4) )No results found for this or any previous visit (from the past 240 hour(s)).   Scheduled Meds: . aspirin EC  81 mg Oral Daily  . carvedilol  25 mg Oral BID WC  . heparin  5,000 Units Subcutaneous Q8H  . hydrALAZINE  50 mg Oral BID  . insulin aspart  0-5 Units Subcutaneous QHS  . insulin aspart  0-9 Units Subcutaneous TID WC  . losartan  100 mg Oral [id number]  . pantoprazole  40 mg Oral Daily  . potassium chloride  40 mEq Oral BID  . pravastatin  20 mg Oral QPM  . sodium chloride flush  3 mL Intravenous Q12H    Continuous Infusions:   Procedures/Studies: Dg Chest 1 View  Result Date: 01/20/2016 CLINICAL DATA:  Pleural effusion. EXAM: CHEST 1 VIEW COMPARISON:  CT chest 01/18/2016 FINDINGS: The heart mediastinal contours are within normal limits. Lung volumes are low. The left costophrenic angle is blunted, suggesting a small pleural effusion. There is left basilar atelectasis. No definite pleural effusion is appreciated on the right on this single view of the chest. The upper lung fields are clear. Negative for pneumothorax P Decreased bony mineralization noted. IMPRESSION: Probable small left pleural effusion visible on this single frontal view of the chest. Left basilar atelectasis. Electronically Signed   By: Curlene Dolphin M.D.   On: 01/20/2016 09:15   Dg Chest 2 View  Result Date: 01/18/2016 CLINICAL DATA:  Patient  is having chest pain in center and left lung region; SOB. Patient says she started having breathing problems after taking specialty medication for her hypertension (she does not know the name of the drug). EXAM: CHEST  2 VIEW COMPARISON:  09/04/2015 FINDINGS: There is new irregular interstitial thickening most evident in the central and lower lungs. No focal lung consolidation. No pleural effusion or pneumothorax. Cardiac silhouette is top-normal in size. There is central vascular congestion. No convincing mediastinal or hilar masses. Skeletal structures are demineralized but grossly intact. IMPRESSION: 1. New bilateral irregular interstitial thickening. Findings may reflect interstitial inflammation related to the new medication. Interstitial pulmonary edema is possible as is diffuse interstitial infection. Electronically Signed   By: Lajean Manes M.D.   On: 01/18/2016 09:32   Ct Angio Chest Pe W/cm &/or Wo Cm  Result Date: 01/18/2016 CLINICAL DATA:  Shortness of breath. EXAM: CT ANGIOGRAPHY CHEST WITH CONTRAST TECHNIQUE: Multidetector CT imaging of the chest was performed using the  standard protocol during bolus administration of intravenous contrast. Multiplanar CT image reconstructions and MIPs were obtained to evaluate the vascular anatomy. CONTRAST:  80 mL of Isovue 370 COMPARISON:  February 17, 2015 CT scan FINDINGS: Cardiovascular: Coronary artery calcifications are seen in the left coronary arteries. The heart size is borderline. The heart is otherwise unremarkable. No pulmonary emboli identified. Mediastinum/Nodes: Shotty nodes in the mediastinum are increased in the interval, possibly reactive. The thoracic aorta is normal in caliber with no dissection. No other mediastinal abnormalities. No pericardial effusion. Lungs/Pleura: Central airways are normal. No pneumothorax. Scarring in the medial right apex is stable. Small moderate bilateral pleural effusions are new in the interval with associated atelectasis. Mild interlobular septal thickening. No mass or other infiltrate. Upper Abdomen: No acute abnormality. Musculoskeletal: No chest wall abnormality. No acute or significant osseous findings. Review of the MIP images confirms the above findings. IMPRESSION: 1. No pulmonary emboli. 2. Pleural effusions and mild edema. Electronically Signed   By: Dorise Bullion III M.D   On: 01/18/2016 14:52    Tai Syfert, DO  [facility] Pager [phone]  If 7PM-7AM, please contact night-coverage [email or web address] Password [email or web address] 01/21/2016, 6:20 PM   LOS: 3 days

## 2016-01-22 ENCOUNTER — Ambulatory Visit (INDEPENDENT_AMBULATORY_CARE_PROVIDER_SITE_OTHER): Payer: Medicare Other | Admitting: Pulmonary Disease

## 2016-01-22 ENCOUNTER — Encounter: Payer: Self-pay | Admitting: Pulmonary Disease

## 2016-01-22 VITALS — BP 133/70 | HR 69 | Ht 64.5 in | Wt 141.4 lb

## 2016-01-22 DIAGNOSIS — I272 Pulmonary hypertension, unspecified: Secondary | ICD-10-CM

## 2016-01-22 DIAGNOSIS — I10 Essential (primary) hypertension: Secondary | ICD-10-CM

## 2016-01-22 DIAGNOSIS — R0602 Shortness of breath: Secondary | ICD-10-CM

## 2016-01-22 LAB — BASIC METABOLIC PANEL
Anion gap: 8 (ref 5–15)
BUN: 31 mg/dL — AB (ref 6–20)
CHLORIDE: 100 mmol/L — AB (ref 101–111)
CO2: 28 mmol/L (ref 22–32)
Calcium: 9.6 mg/dL (ref 8.9–10.3)
Creatinine, Ser: 1.4 mg/dL — ABNORMAL HIGH (ref 0.44–1.00)
GFR calc Af Amer: 43 mL/min — ABNORMAL LOW (ref >60)
GFR calc non Af Amer: 37 mL/min — ABNORMAL LOW (ref >60)
GLUCOSE: 190 mg/dL — AB (ref 65–99)
POTASSIUM: 4.5 mmol/L (ref 3.5–5.1)
Sodium: 136 mmol/L (ref 135–145)

## 2016-01-22 LAB — GLUCOSE, CAPILLARY: GLUCOSE-CAPILLARY: 198 mg/dL — AB (ref 65–99)

## 2016-01-22 MED ORDER — LOSARTAN POTASSIUM 25 MG PO TABS: 100.0000 mg | ORAL_TABLET | ORAL | 0 refills | Status: DC

## 2016-01-22 MED ORDER — FUROSEMIDE 20 MG PO TABS: 20.0000 mg | ORAL_TABLET | Freq: Every day | ORAL | Status: DC

## 2016-01-22 MED ORDER — FUROSEMIDE 20 MG PO TABS: 20.0000 mg | ORAL_TABLET | Freq: Every day | ORAL | 0 refills | Status: DC

## 2016-01-22 NOTE — Care Management Important Message (Signed)
Important Message  Patient Details  Name: Ann Hunter MRN: [id number] Date of Birth: June 20, 1945   Medicare Important Message Given:  Yes    Nathen May 01/22/2016, 9:35 AM

## 2016-01-22 NOTE — Discharge Summary (Signed)
Physician Discharge Summary  Ann Hunter [id number] DOB: 06-Feb-1946 DOA: 01/18/2016  PCP: Lillard Anes, MD  Admit date: 01/18/2016 Discharge date: 01/22/2016  Admitted From: Home Disposition:  Home  Recommendations for Outpatient Follow-up:  1. Follow up with PCP in 1 week 2. Please obtain BMP in one week 3. Please follow up on the following pending results:  HbA1C  Home Health:no Equipment/Devices:none  Discharge Condition: stable CODE STATUS:FULL Diet recommendation: Heart Healthy / Carb Modified    Brief/Interim Summary:  70 y.o.female,With past medical history of pulmonary hypertension, restrictive lung disease, followed by Dr. Lake Bells, type 2 diabetes, hypertension, hyperlipidemia, patient has been followed with pulmonary for chronic dyspnea, workup significant for restrictive lung disease, but no evidence of ILD or other cause, patient reports progressive dyspnea over last 10 days, she was recently started on Letairis for pulmonary hypertension. She denies any cough, productive sputum, fever chills or lower extremity edema.As well she reports chest pressure midsternal, intermittent, accompanied by her dyspnea.  She was told to stop her Letairis due to her fatigue and sob prior to admission. - In ED, workup significant for hypoxia 84% on room air by ED physician, CTAchest significant for pulmonary edema and bilateral pleural effusion  Discharge Diagnoses:  Acute hypoxic respiratory failure -Secondary to pleural effusions and pulmonary edema -Resolved -10/81/7 CTA chest with evidence of pulmonary edema and pleural effusion,  - Presently stable on room air  Bilateral pleural effusion/pulmonary edema--acute diastolic CHF - ?related to Letairis -01/20/2016 echo EF 55-6 percent, grade 2 DD, trace MR/TR -NEG 5.4 L for the admission -d/c IV lasix due to increasing serum creatinine--serum creatinine improved from 1.69-->1.40 on day of d/c -home with lasix 20  mg po daily starting 10/13-pt given instructions on weights  Chest pain - resolved  - appears nontypical, reproducible by palpation,  - Pain management PRN   AKI -due to diuresis -hold IV lasix -am BMP -serum creatinine 1.40 on day of d/c  Restrictive lung disease/pulmonary hypertension - Dr Lake Bells office called and left message  - Recently started on Letairis, stopped by Dr. Lake Bells   Hypertension slight elevated  - Continuecoreg - Continue with home medication--losartan dose decreased from 100mg  to 25 mg daily due to rising K and renal insufficiency-->follow up with PCP in one week with BMP - [place] BP  Diabetes mellitus - SSI - Monitor CBGs -will not restart metformin due to AKI--follow up with PCP for repeat BMP in one week -A1C--pending at time of d/c -home with amaryl 4 mg bid  GERD - Cont with PPI   Discharge Instructions  Discharge Instructions    Diet - low sodium heart healthy    Complete by:  As directed    Diet Carb Modified    Complete by:  As directed    Increase activity slowly    Complete by:  As directed        Medication List    STOP taking these medications   ambrisentan 5 MG tablet Commonly known as:  LETAIRIS   metFORMIN 1000 MG tablet Commonly known as:  GLUCOPHAGE     TAKE these medications   carvedilol 25 MG tablet Commonly known as:  COREG TAKE 1 TABLET TWICE A DAY FOR 90 DAY(S)   furosemide 20 MG tablet Commonly known as:  LASIX Take 1 tablet (20 mg total) by mouth daily. Start taking on:  01/23/2016   glimepiride 4 MG tablet Commonly known as:  AMARYL Take 4 mg by mouth 2 (two) times daily.  hydrALAZINE 50 MG tablet Commonly known as:  APRESOLINE Take 50 mg by mouth 2 (two) times daily.   losartan 25 MG tablet Commonly known as:  COZAAR Take 4 tablets (100 mg total) by mouth every morning. Start taking on:  01/23/2016 What changed:  medication strength   pravastatin 20 MG tablet Commonly known as:   PRAVACHOL Take 20 mg by mouth every evening.      Follow-up Information    Lillard Anes, MD. Go on 01/28/2016.   Specialty:  Family Medicine Why:  @ 2:30pm Contact information: [address] [place] [address] [phone]          Allergies  Allergen Reactions  . Advil [Ibuprofen] Swelling  . Codeine Shortness Of Breath    Throat Swelling  . Letairis [Ambrisentan] Shortness Of Breath  . Ranitidine Anaphylaxis  . Sitagliptin Anaphylaxis and Swelling  . Ace Inhibitors   . Aleve [Naproxen Sodium]   . Boniva [Ibandronic Acid] Diarrhea    Swelling   . Clonidine Derivatives   . Januvia [Sitagliptin Phosphate]   . Penicillins Swelling    Has patient had a PCN reaction causing immediate rash, facial/tongue/throat swelling, SOB or lightheadedness with hypotension: Yes Has patient had a PCN reaction causing severe rash involving mucus membranes or skin necrosis: No Has patient had a PCN reaction that required hospitalization No Has patient had a PCN reaction occurring within the last 10 years: No If all of the above answers are "NO", then may proceed with Cephalosporin use.    . Sulfa Antibiotics     Throat swells    Consultations:  None   Procedures/Studies: Dg Chest 1 View  Result Date: 01/20/2016 CLINICAL DATA:  Pleural effusion. EXAM: CHEST 1 VIEW COMPARISON:  CT chest 01/18/2016 FINDINGS: The heart mediastinal contours are within normal limits. Lung volumes are low. The left costophrenic angle is blunted, suggesting a small pleural effusion. There is left basilar atelectasis. No definite pleural effusion is appreciated on the right on this single view of the chest. The upper lung fields are clear. Negative for pneumothorax P Decreased bony mineralization noted. IMPRESSION: Probable small left pleural effusion visible on this single frontal view of the chest. Left basilar atelectasis. Electronically Signed   By: Curlene Dolphin M.D.   On: 01/20/2016 09:15    Dg Chest 2 View  Result Date: 01/18/2016 CLINICAL DATA:  Patient is having chest pain in center and left lung region; SOB. Patient says she started having breathing problems after taking specialty medication for her hypertension (she does not know the name of the drug). EXAM: CHEST  2 VIEW COMPARISON:  09/04/2015 FINDINGS: There is new irregular interstitial thickening most evident in the central and lower lungs. No focal lung consolidation. No pleural effusion or pneumothorax. Cardiac silhouette is top-normal in size. There is central vascular congestion. No convincing mediastinal or hilar masses. Skeletal structures are demineralized but grossly intact. IMPRESSION: 1. New bilateral irregular interstitial thickening. Findings may reflect interstitial inflammation related to the new medication. Interstitial pulmonary edema is possible as is diffuse interstitial infection. Electronically Signed   By: Lajean Manes M.D.   On: 01/18/2016 09:32   Ct Angio Chest Pe W/cm &/or Wo Cm  Result Date: 01/18/2016 CLINICAL DATA:  Shortness of breath. EXAM: CT ANGIOGRAPHY CHEST WITH CONTRAST TECHNIQUE: Multidetector CT imaging of the chest was performed using the standard protocol during bolus administration of intravenous contrast. Multiplanar CT image reconstructions and MIPs were obtained to evaluate the vascular anatomy. CONTRAST:  80 mL of Isovue 370 COMPARISON:  February 17, 2015 CT scan FINDINGS: Cardiovascular: Coronary artery calcifications are seen in the left coronary arteries. The heart size is borderline. The heart is otherwise unremarkable. No pulmonary emboli identified. Mediastinum/Nodes: Shotty nodes in the mediastinum are increased in the interval, possibly reactive. The thoracic aorta is normal in caliber with no dissection. No other mediastinal abnormalities. No pericardial effusion. Lungs/Pleura: Central airways are normal. No pneumothorax. Scarring in the medial right apex is stable. Small moderate  bilateral pleural effusions are new in the interval with associated atelectasis. Mild interlobular septal thickening. No mass or other infiltrate. Upper Abdomen: No acute abnormality. Musculoskeletal: No chest wall abnormality. No acute or significant osseous findings. Review of the MIP images confirms the above findings. IMPRESSION: 1. No pulmonary emboli. 2. Pleural effusions and mild edema. Electronically Signed   By: Dorise Bullion III M.D   On: 01/18/2016 14:52        Discharge Exam: Vitals:   01/21/16 2010 01/22/16 0508  BP: 127/60 136/60  Pulse: 68 76  Resp: 18 16  Temp: 98.5 F (36.9 C) 98.7 F (37.1 C)   Vitals:   01/21/16 1132 01/21/16 2010 01/22/16 0508 01/22/16 0702  BP: 133/60 127/60 136/60   Pulse: 64 68 76   Resp: 18 18 16    Temp: 98.6 F (37 C) 98.5 F (36.9 C) 98.7 F (37.1 C)   TempSrc: Oral Oral Oral   SpO2: 96% 93% 92% 97%  Weight:   63.7 kg (140 lb 6.4 oz)   Height:        General: Pt is alert, awake, not in acute distress Cardiovascular: RRR, S1/S2 +, no rubs, no gallops Respiratory: CTA bilaterally, no wheezing, no rhonchi Abdominal: Soft, NT, ND, bowel sounds + Extremities: no edema, no cyanosis   The results of significant diagnostics from this hospitalization (including imaging, microbiology, ancillary and laboratory) are listed below for reference.    Significant Diagnostic Studies: Dg Chest 1 View  Result Date: 01/20/2016 CLINICAL DATA:  Pleural effusion. EXAM: CHEST 1 VIEW COMPARISON:  CT chest 01/18/2016 FINDINGS: The heart mediastinal contours are within normal limits. Lung volumes are low. The left costophrenic angle is blunted, suggesting a small pleural effusion. There is left basilar atelectasis. No definite pleural effusion is appreciated on the right on this single view of the chest. The upper lung fields are clear. Negative for pneumothorax P Decreased bony mineralization noted. IMPRESSION: Probable small left pleural effusion  visible on this single frontal view of the chest. Left basilar atelectasis. Electronically Signed   By: Curlene Dolphin M.D.   On: 01/20/2016 09:15   Dg Chest 2 View  Result Date: 01/18/2016 CLINICAL DATA:  Patient is having chest pain in center and left lung region; SOB. Patient says she started having breathing problems after taking specialty medication for her hypertension (she does not know the name of the drug). EXAM: CHEST  2 VIEW COMPARISON:  09/04/2015 FINDINGS: There is new irregular interstitial thickening most evident in the central and lower lungs. No focal lung consolidation. No pleural effusion or pneumothorax. Cardiac silhouette is top-normal in size. There is central vascular congestion. No convincing mediastinal or hilar masses. Skeletal structures are demineralized but grossly intact. IMPRESSION: 1. New bilateral irregular interstitial thickening. Findings may reflect interstitial inflammation related to the new medication. Interstitial pulmonary edema is possible as is diffuse interstitial infection. Electronically Signed   By: Lajean Manes M.D.   On: 01/18/2016 09:32   Ct Angio Chest  Pe W/cm &/or Wo Cm  Result Date: 01/18/2016 CLINICAL DATA:  Shortness of breath. EXAM: CT ANGIOGRAPHY CHEST WITH CONTRAST TECHNIQUE: Multidetector CT imaging of the chest was performed using the standard protocol during bolus administration of intravenous contrast. Multiplanar CT image reconstructions and MIPs were obtained to evaluate the vascular anatomy. CONTRAST:  80 mL of Isovue 370 COMPARISON:  February 17, 2015 CT scan FINDINGS: Cardiovascular: Coronary artery calcifications are seen in the left coronary arteries. The heart size is borderline. The heart is otherwise unremarkable. No pulmonary emboli identified. Mediastinum/Nodes: Shotty nodes in the mediastinum are increased in the interval, possibly reactive. The thoracic aorta is normal in caliber with no dissection. No other mediastinal abnormalities.  No pericardial effusion. Lungs/Pleura: Central airways are normal. No pneumothorax. Scarring in the medial right apex is stable. Small moderate bilateral pleural effusions are new in the interval with associated atelectasis. Mild interlobular septal thickening. No mass or other infiltrate. Upper Abdomen: No acute abnormality. Musculoskeletal: No chest wall abnormality. No acute or significant osseous findings. Review of the MIP images confirms the above findings. IMPRESSION: 1. No pulmonary emboli. 2. Pleural effusions and mild edema. Electronically Signed   By: Dorise Bullion III M.D   On: 01/18/2016 14:52     Microbiology: No results found for this or any previous visit (from the past 240 hour(s)).   Labs: Basic Metabolic Panel:  Recent Labs Lab 01/18/16 0852 01/19/16 0035 01/20/16 0222 01/21/16 1525 01/22/16 0309  NA 136 136 136 134* 136  K 4.6 3.8 3.3* 4.4 4.5  CL 105 98* 94* 94* 100*  CO2 24 31 31 28 28   GLUCOSE 176* 126* 180* 272* 190*  BUN 15 16 24* 31* 31*  CREATININE 0.97 1.35* 1.34* 1.69* 1.40*  CALCIUM 10.1 9.8 9.8 10.1 9.6   Liver Function Tests:  Recent Labs Lab 01/19/16 0035  AST 14*  ALT 14  ALKPHOS 36*  BILITOT 0.8  PROT 6.0*  ALBUMIN 3.4*   No results for input(s): LIPASE, AMYLASE in the last 168 hours. No results for input(s): AMMONIA in the last 168 hours. CBC:  Recent Labs Lab 01/18/16 0852 01/20/16 0222  WBC 6.4 7.1  NEUTROABS  --  4.8  HGB 9.6* 9.7*  HCT 31.9* 31.5*  MCV 88.1 88.0  PLT 251 273   Cardiac Enzymes:  Recent Labs Lab 01/18/16 1927 01/19/16 0035 01/19/16 0712  TROPONINI <0.03 <0.03 <0.03   BNP: Invalid input(s): POCBNP CBG:  Recent Labs Lab 01/21/16 0546 01/21/16 1130 01/21/16 1630 01/21/16 2052 01/22/16 0603  GLUCAP 179* 265* 190* 198* 198*    Time coordinating discharge:  Greater than 30 minutes  Signed:  Tong Pieczynski, DO [facility] Pager: [phone] 01/22/2016, 9:53 AM

## 2016-01-22 NOTE — Progress Notes (Deleted)
i

## 2016-01-22 NOTE — Patient Instructions (Signed)
Keep taking your medicines as prescribed today at hospital discharge We will refer you to Dr. Rich Reining in the [place] pulmonary hypertension clinic We'll see you back in 2 months or sooner if needed

## 2016-01-22 NOTE — Assessment & Plan Note (Signed)
Ann Hunter was just discharged from the hospital today. She had an adverse reaction to [place] I believe. This is concerning because she developed a volume overload type syndrome.  Either this represents previously undiagnosed diastolic dysfunction or more ominously pulmonary veno occlusive disease. Notably, the wedge pressure from her right heart catheterization in May 2017 was normal. I suppose it's also possible that the value from the right heart catheterization were inaccurate but her workup has been consistent with pulmonary hypertension up until this point.  She has New York Heart Association class 2-3 symptoms. I'm pleased that her echocardiogram showed no evidence of ulnar hypertension.  Plan: Hold further pulmonary hypertension treatment Refer to [facility] pulmonary hypertension clinic, specific question is is this pulmonary venoocclusive disease Follow-up 2 months  Greater than 50% of her visit today was spent face-to-face, 20 minute visit

## 2016-01-22 NOTE — Progress Notes (Signed)
Subjective:    Patient ID: Ann Hunter, female    DOB: February 22, 1946, 70 y.o.   MRN: [id number]  Synopsis: referred by Dr. Guadalupe Dawn in 2017 for pulmonary hypertension.  02/2015 HRCT > personally reviewed, mild atelectasis in the bases, no ILD; mosaicism noted  December 2016 pulmonary function testing ratio 77%, FEV1 1.67 L, FVC 2.17 L (73% predicted), total lung capacity 0.34 L (66% protected), ERV 10% predicted DLCO 14.64 (60% predicted).  May 2017 right heart catheterization right atrial mean pressure 2 mmHg, right ventricular systolic pressure 56, pulmonary artery mean pressure 42 mmHg, pulmonary capillary wedge pressure mean 13 mmHg, left ventricular end-diastolic pressure 14.  No coronary artery disease.  Blood work performed by Dr. Chase Caller in May 2017 shows a slightly elevated antineutrophilic antibody panel at 1-40, CCP negative, double-stranded DNA negative, rheumatoid factor negative, SSA and SSB both negative. Scleroderma antibody negative.  09/04/2015 VQ scan no evidence of pulmonary embolism.  June 2017 respiratory muscle strength testing: MIP 49 cm H20 (37% pred), MEP -35 cm H20, 53% pred  She was admitted to the hospital in October 2017 after taking 7 days of Letairis. She had increasing shortness of breath, hypoxemia and bilateral pleural effusions.  01/20/2016 echocardiogram showed normal LVEF, grade 2 diastolic dysfunction, normal RV size and function, apparently transvalvular velocity around the tricuspid valve is within normal range  October 2017 CT scan chest angiogram study no pulmonary wasn't, bilateral pleural effusions, trace edema bases   HPI Chief Complaint  Patient presents with  . Follow-up    review 105mw    Caitlin says that she had dyspnea nearly right away after taking the pill.  She said that by taking the third pills she felt symptoms but then three days in she developed progressively worse dyspnea and fatigued.  She was not swelling in her legs.  No  fever, no chills, no cough.    She has never had this happen before.    She is feeling better now. She wanted to walk the full 6 minutes today but her legs were tired. No cough, no fevers no chills  She had her blood pressure medicines adjusted during the hospitalization. She was treated with diuretics.  Past Medical History:  Diagnosis Date  . Acute diastolic CHF (congestive heart failure) ([place]) 01/21/2016  . B12 deficiency   . Bladder cancer ([place])   . Closed head injury   . Essential hypertension, benign   . GERD (gastroesophageal reflux disease)   . Head injury, unspecified   . Memory difficulty    d/t head injury  . Osteoporosis, unspecified   . Paroxysmal atrial fibrillation (HCC)    during septic shock, none since  . Pulmonary hypertension   . Pure hypercholesterolemia   . Type II or unspecified type diabetes mellitus without mention of complication, uncontrolled   . Unspecified vitamin D deficiency       Review of Systems  Constitutional: Negative for chills, fatigue and fever.  HENT: Negative for rhinorrhea, sinus pressure and sneezing.   Respiratory: Positive for shortness of breath. Negative for cough and wheezing.   Cardiovascular: Negative for chest pain, palpitations and leg swelling.       Objective:   Physical Exam  Vitals:   01/22/16 1150  BP: 133/70  Pulse: 69  SpO2: 96%  Weight: 141 lb 6.4 oz (64.1 kg)  Height: 5' 4.5" (1.638 m)  RA  Gen: well appearing HENT: OP clear, TM's clear, neck supple PULM: CTA B, normal percussion CV: RRR,  no mgr, trace edema, JVD noted GI: BS+, soft, nontender Derm: no cyanosis or rash Psyche: normal mood and affect   Hospital records were reviewed were she was admitted and treated for what appeared to be diastolic heart failure treated with IV diuretics.  CT chest images personally reviewed, see description above      Assessment & Plan:  Pulmonary hypertension Tanera was just discharged from the hospital  today. She had an adverse reaction to [place] I believe. This is concerning because she developed a volume overload type syndrome.  Either this represents previously undiagnosed diastolic dysfunction or more ominously pulmonary veno occlusive disease. Notably, the wedge pressure from her right heart catheterization in May 2017 was normal. I suppose it's also possible that the value from the right heart catheterization were inaccurate but her workup has been consistent with pulmonary hypertension up until this point.  She has New York Heart Association class 2-3 symptoms. I'm pleased that her echocardiogram showed no evidence of ulnar hypertension.  Plan: Hold further pulmonary hypertension treatment Refer to [facility] pulmonary hypertension clinic, specific question is is this pulmonary venoocclusive disease Follow-up 2 months  Greater than 50% of her visit today was spent face-to-face, 20 minute visit   No current facility-administered medications for this visit.   Current Outpatient Prescriptions:  .  [START ON 01/23/2016] furosemide (LASIX) 20 MG tablet, Take 1 tablet (20 mg total) by mouth daily., Disp: 30 tablet, Rfl: 0 .  [START ON 01/23/2016] losartan (COZAAR) 25 MG tablet, Take 4 tablets (100 mg total) by mouth every morning., Disp: 30 tablet, Rfl: 0  Facility-Administered Medications Ordered in Other Visits:  .  albuterol (PROVENTIL) (2.5 MG/3ML) 0.083% nebulizer solution 2.5 mg, 2.5 mg, Nebulization, Q2H PRN, Albertine Patricia, MD .  aspirin EC tablet 81 mg, 81 mg, Oral, Daily, Albertine Patricia, MD, 81 mg at 01/22/16 1003 .  carvedilol (COREG) tablet 25 mg, 25 mg, Oral, BID WC, Orson Eva, MD, 25 mg at 01/22/16 0839 .  diphenhydrAMINE (BENADRYL) capsule 25 mg, 25 mg, Oral, Q6H PRN, Doreatha Lew, MD .  Derrill Memo ON 01/23/2016] furosemide (LASIX) tablet 20 mg, 20 mg, Oral, Daily, David Tat, MD .  heparin injection 5,000 Units, 5,000 Units, Subcutaneous, Q8H, Albertine Patricia, MD,  5,000 Units at 01/21/16 [phone] .  hydrALAZINE (APRESOLINE) tablet 50 mg, 50 mg, Oral, BID, Albertine Patricia, MD, 50 mg at 01/22/16 1003 .  insulin aspart (novoLOG) injection 0-5 Units, 0-5 Units, Subcutaneous, QHS, Albertine Patricia, MD, 3 Units at 01/20/16 2149 .  insulin aspart (novoLOG) injection 0-9 Units, 0-9 Units, Subcutaneous, TID WC, Albertine Patricia, MD, 2 Units at 01/22/16 [phone] .  losartan (COZAAR) tablet 100 mg, 100 mg, Oral, BH-q7a, Albertine Patricia, MD, 100 mg at 01/22/16 0656 .  pantoprazole (PROTONIX) EC tablet 40 mg, 40 mg, Oral, Daily, Albertine Patricia, MD, 40 mg at 01/22/16 0839 .  pravastatin (PRAVACHOL) tablet 20 mg, 20 mg, Oral, QPM, Silver Huguenin Elgergawy, MD, 20 mg at 01/21/16 1703 .  sodium chloride flush (NS) 0.9 % injection 3 mL, 3 mL, Intravenous, Q12H, Silver Huguenin Elgergawy, MD, 3 mL at 01/21/16 1000

## 2016-01-23 LAB — HEMOGLOBIN A1C
HEMOGLOBIN A1C: 6.4 % — AB (ref 4.8–5.6)
MEAN PLASMA GLUCOSE: 137 mg/dL

## 2016-02-09 ENCOUNTER — Telehealth: Payer: Self-pay | Admitting: Pulmonary Disease

## 2016-02-09 NOTE — Telephone Encounter (Signed)
LMTCB

## 2016-02-10 NOTE — Telephone Encounter (Signed)
lmtcb X2 for [employer].

## 2016-02-11 NOTE — Telephone Encounter (Signed)
lmtcb x3 for Ann Hunter.

## 2016-02-12 NOTE — Telephone Encounter (Signed)
lmtcb x4 for Ann Hunter. Per triage protocol, message will be closed.

## 2016-02-27 ENCOUNTER — Telehealth: Payer: Self-pay | Admitting: Pulmonary Disease

## 2016-02-27 NOTE — Telephone Encounter (Signed)
Plan: Hold further pulmonary hypertension treatment Refer to [facility] pulmonary hypertension clinic, specific question is is this pulmonary venoocclusive disease Follow-up 2 months  Called and spoke with Ann Hunter and she is going to make her account inactive at this time and if the pt is needing to restart on the letaris, then we can just call back and re-activate her account.  Nothing further is needed.

## 2016-03-01 ENCOUNTER — Ambulatory Visit: Payer: Medicare Other | Admitting: Cardiology

## 2016-03-23 ENCOUNTER — Encounter: Payer: Self-pay | Admitting: Pulmonary Disease

## 2016-03-23 ENCOUNTER — Ambulatory Visit (INDEPENDENT_AMBULATORY_CARE_PROVIDER_SITE_OTHER): Payer: Medicare Other | Admitting: Pulmonary Disease

## 2016-03-23 ENCOUNTER — Other Ambulatory Visit (INDEPENDENT_AMBULATORY_CARE_PROVIDER_SITE_OTHER): Payer: Medicare Other

## 2016-03-23 VITALS — BP 138/66 | HR 68 | Ht 64.5 in | Wt 144.0 lb

## 2016-03-23 DIAGNOSIS — I272 Pulmonary hypertension, unspecified: Secondary | ICD-10-CM | POA: Diagnosis not present

## 2016-03-23 DIAGNOSIS — I5032 Chronic diastolic (congestive) heart failure: Secondary | ICD-10-CM | POA: Diagnosis not present

## 2016-03-23 DIAGNOSIS — I10 Essential (primary) hypertension: Secondary | ICD-10-CM

## 2016-03-23 LAB — BASIC METABOLIC PANEL
BUN: 19 mg/dL (ref 6–23)
CALCIUM: 9.9 mg/dL (ref 8.4–10.5)
CO2: 25 meq/L (ref 19–32)
CREATININE: 1.09 mg/dL (ref 0.40–1.20)
Chloride: 102 mEq/L (ref 96–112)
GFR: 52.48 mL/min — ABNORMAL LOW (ref >60.00)
Glucose, Bld: 103 mg/dL — ABNORMAL HIGH (ref 70–99)
Potassium: 4.6 mEq/L (ref 3.5–5.1)
SODIUM: 137 meq/L (ref 135–145)

## 2016-03-23 NOTE — Progress Notes (Signed)
Subjective:    Patient ID: Ann Hunter, female    DOB: Aug 05, 1945, 70 y.o.   MRN: [id number]  Synopsis: referred by Dr. Guadalupe Hunter in 2017 for pulmonary hypertension which was ultimately shown to be due to diastolic heart failure in a 02/2016 RHC at [facility] which showed:  CATH right heart11/30/2017 Ann Hunter Component Name Value Ref Range  Cardiac Index (l/min/m2) 2.6 L/min/m2  Right Atrium Mean Pressure (mmHg) 10 mmHg   Right Ventricle Systolic Pressure (mmHg) 82 mmHg   Pulmonary Artery Mean Pressure (mmHg) 50 mmHg   Pulmonary Wedge Pressure (mmHg) 26 mmHg   Pulmonary Vascular Resistance (Wood units) 5.4 Wood units      02/2015 HRCT > personally reviewed, mild atelectasis in the bases, no ILD; mosaicism noted  December 2016 pulmonary function testing ratio 77%, FEV1 1.67 L, FVC 2.17 L (73% predicted), total lung capacity 0.34 L (66% protected), ERV 10% predicted DLCO 14.64 (60% predicted).  May 2017 right heart catheterization right atrial mean pressure 2 mmHg, right ventricular systolic pressure 56, pulmonary artery mean pressure 42 mmHg, pulmonary capillary wedge pressure mean 13 mmHg, left ventricular end-diastolic pressure 14.  No coronary artery disease.  Blood work performed by Dr. Chase Hunter in May 2017 shows a slightly elevated antineutrophilic antibody panel at 1-40, CCP negative, double-stranded DNA negative, rheumatoid factor negative, SSA and SSB both negative. Scleroderma antibody negative.  09/04/2015 VQ scan no evidence of pulmonary embolism.  June 2017 respiratory muscle strength testing: MIP 49 cm H20 (37% pred), MEP -35 cm H20, 53% pred  She was admitted to the hospital in October 2017 after taking 7 days of Letairis. She had increasing shortness of breath, hypoxemia and bilateral pleural effusions.  01/20/2016 echocardiogram showed normal LVEF, grade 2 diastolic dysfunction, normal RV size and function, apparently transvalvular velocity around  the tricuspid valve is within normal range  October 2017 CT scan chest angiogram study no pulmonary wasn't, bilateral pleural effusions, trace edema bases   HPI Chief Complaint  Patient presents with  . Follow-up    pt doing well, denies any breathing complaints at this time .   Ann Hunter had an exercise right heart catheterization at [facility] on November 30 that showed: CATH right heart11/30/2017 Ann Hunter Component Name Value Ref Range  Cardiac Index (l/min/m2) 2.6 L/min/m2  Right Atrium Mean Pressure (mmHg) 10 mmHg   Right Ventricle Systolic Pressure (mmHg) 82 mmHg   Pulmonary Artery Mean Pressure (mmHg) 50 mmHg   Pulmonary Wedge Pressure (mmHg) 26 mmHg   Pulmonary Vascular Resistance (Wood units) 5.4 Wood units    She says that she has good days and bad days now.  Some dyspnea.  Minimal cough.    Blood pressure has been running higher at home, running 140-150 SBP regularly.      Past Medical History:  Diagnosis Date  . Acute diastolic CHF (congestive heart failure) ([place]) 01/21/2016  . B12 deficiency   . Bladder cancer ([place])   . Closed head injury   . Essential hypertension, benign   . GERD (gastroesophageal reflux disease)   . Head injury, unspecified   . Memory difficulty    d/t head injury  . Osteoporosis, unspecified   . Paroxysmal atrial fibrillation (HCC)    during septic shock, none since  . Pulmonary hypertension   . Pure hypercholesterolemia   . Type II or unspecified type diabetes mellitus without mention of complication, uncontrolled   . Unspecified vitamin D deficiency       Review  of Systems  Constitutional: Negative for chills, fatigue and fever.  HENT: Negative for rhinorrhea, sinus pressure and sneezing.   Respiratory: Positive for shortness of breath. Negative for cough and wheezing.   Cardiovascular: Negative for chest pain, palpitations and leg swelling.       Objective:   Physical Exam  Vitals:   03/23/16 1408  BP:  138/66  Pulse: 68  SpO2: 95%  Weight: 144 lb (65.3 kg)  Height: 5' 4.5" (1.638 m)  RA  Gen: well appearing HENT: OP clear, TM's clear, neck supple PULM: CTA B, normal percussion CV: RRR, no mgr, trace edema, JVD noted GI: BS+, soft, nontender Derm: no cyanosis or rash Psyche: normal mood and affect   Records from her exercise right heart cath at [facility] reviewed, see summary above.      Assessment & Plan:  Impression: Essential hypertension Diastolic CHF Pulmonary hypertension  Discussion: The right heart catheterization performed at [facility] 12 days ago shows a markedly elevated pulmonary capillary wedge pressure consistent with a diagnosis of diastolic heart failure. She does not have precapillary pulmonary hypertension. There is no role for a pulmonary vasodilator. We need to focus on keeping her hypertension under better control.  For your hypertension: I am going to check a basic metabolic panel (blood test) If the blood test is okay then we will call you and let you know you should increase the dose of your losartan to 100 mg daily. I when she to continue follow-up with cardiology, we will call to get she seen sooner than January.  For the pulmonary hypertension: This is due to her diastolic heart failure and hypertension, there is no need to take another medicine like Letairis again  For the diastolic heart failure: We will focus on keeping your blood pressure under good control  I will see you back in 2 months or sooner if needed  > 50% of this 26 minute visit spent face to face   Current Outpatient Prescriptions:  .  aspirin 81 MG chewable tablet, Chew 81 mg by mouth daily., Disp: , Rfl:  .  carvedilol (COREG) 25 MG tablet, TAKE 1 TABLET TWICE A DAY FOR 90 DAY(S), Disp: , Rfl: 1 .  glimepiride (AMARYL) 4 MG tablet, Take 4 mg by mouth 2 (two) times daily. , Disp: , Rfl:  .  hydrALAZINE (APRESOLINE) 50 MG tablet, Take 50 mg by mouth 2 (two) times  daily., Disp: , Rfl: 3 .  losartan (COZAAR) 50 MG tablet, Take 50 mg by mouth daily., Disp: , Rfl:  .  metFORMIN (GLUCOPHAGE) 1000 MG tablet, Take 1,000 mg by mouth 2 (two) times daily with a meal., Disp: , Rfl:  .  pravastatin (PRAVACHOL) 20 MG tablet, Take 20 mg by mouth every evening. , Disp: , Rfl:

## 2016-03-23 NOTE — Patient Instructions (Signed)
For your hypertension: I am going to check a basic metabolic panel (blood test) If the blood test is okay then we will call you and let you know you should increase the dose of your losartan to 100 mg daily. I when she to continue follow-up with cardiology, we will call to get she seen sooner than January.  For the pulmonary hypertension: This is due to her diastolic heart failure and hypertension, there is no need to take another medicine like Letairis again  For the diastolic heart failure: We will focus on keeping your blood pressure under good control  I will see you back in 2 months or sooner if needed

## 2016-03-26 ENCOUNTER — Ambulatory Visit (INDEPENDENT_AMBULATORY_CARE_PROVIDER_SITE_OTHER): Payer: Medicare Other | Admitting: Cardiology

## 2016-03-26 ENCOUNTER — Encounter: Payer: Self-pay | Admitting: Cardiology

## 2016-03-26 VITALS — BP 170/66 | HR 65 | Ht 64.0 in | Wt 145.4 lb

## 2016-03-26 DIAGNOSIS — E119 Type 2 diabetes mellitus without complications: Secondary | ICD-10-CM

## 2016-03-26 DIAGNOSIS — I10 Essential (primary) hypertension: Secondary | ICD-10-CM | POA: Diagnosis not present

## 2016-03-26 DIAGNOSIS — I272 Pulmonary hypertension, unspecified: Secondary | ICD-10-CM

## 2016-03-26 DIAGNOSIS — I5189 Other ill-defined heart diseases: Secondary | ICD-10-CM | POA: Insufficient documentation

## 2016-03-26 DIAGNOSIS — R0602 Shortness of breath: Secondary | ICD-10-CM

## 2016-03-26 DIAGNOSIS — I519 Heart disease, unspecified: Secondary | ICD-10-CM | POA: Diagnosis not present

## 2016-03-26 DIAGNOSIS — E782 Mixed hyperlipidemia: Secondary | ICD-10-CM

## 2016-03-26 DIAGNOSIS — Z79899 Other long term (current) drug therapy: Secondary | ICD-10-CM

## 2016-03-26 DIAGNOSIS — I2510 Atherosclerotic heart disease of native coronary artery without angina pectoris: Secondary | ICD-10-CM | POA: Diagnosis not present

## 2016-03-26 DIAGNOSIS — I480 Paroxysmal atrial fibrillation: Secondary | ICD-10-CM

## 2016-03-26 MED ORDER — LOSARTAN POTASSIUM 100 MG PO TABS: 50.0000 mg | ORAL_TABLET | Freq: Every day | ORAL | 11 refills | Status: DC

## 2016-03-26 NOTE — Assessment & Plan Note (Signed)
Chronic complaint.

## 2016-03-26 NOTE — Assessment & Plan Note (Signed)
On low dose statin Rx

## 2016-03-26 NOTE — Assessment & Plan Note (Signed)
Her B/P has been running high and Dr Lake Bells had suggested she increase her Losartan to 100 mg daily

## 2016-03-26 NOTE — Assessment & Plan Note (Signed)
Remote episode of PAF in setting of sepsis, no recurrance
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
## 2016-03-26 NOTE — Patient Instructions (Signed)
Medication Instructions:  INCREASE LOSARTAN TO 100MG    If you need a refill on your cardiac medications before your next appointment, please call your pharmacy.  Labwork: BMP 3 DAYS TO A WEEK BEFORE FOLLOW UP IN [place]  Testing/Procedures: NONE  Follow-Up: Your physician recommends that you schedule a follow-up appointment in: January [place]!    Thank you for choosing [facility]!!

## 2016-03-26 NOTE — Assessment & Plan Note (Signed)
On Glucophage 

## 2016-03-26 NOTE — Assessment & Plan Note (Signed)
Minor CAD at cath may 2017- normal LVF

## 2016-03-26 NOTE — Assessment & Plan Note (Addendum)
Followed by Dr Lake Bells, Rt heart cath done at [facility] Oct 2017 by Dr Tobie Poet pressure 82 mmHg

## 2016-03-26 NOTE — Assessment & Plan Note (Signed)
Grade 2 on echo with normal LVF Oct 2017

## 2016-04-13 NOTE — Progress Notes (Signed)
HPI: FU dyspnea. Patient previously had an episode of atrial fibrillation in the setting of urosepsis. High-resolution CT November 2016 did not show interstitial lung disease. There was note of three-vessel coronary artery calcification. CPX March 2017 suggested functional limitation due to circulatory limitation. Findings suggestive of diastolic dysfunction and pulmonary hypertension. Cath 5/17 showed no obstructive CAD, normal LV function and moderate pulmonary HTN with normal LV filling pressures. VQ 5/17 negative. Echo 10/17 showed normal LV function, grade 2 diastolic dysfunction, mild LAE. Chest CT 10/17 showed no PE; pleural effusions and mild edema. [place] 11/17 showed RA 10, PA mean 50 and PCWP 26. Dyspnea now felt related to chronic diastolic CHF. Since last seen, patient has mild dyspnea on exertion but no orthopnea, PND, pedal edema, chest pain or syncope.  Current Outpatient Prescriptions  Medication Sig Dispense Refill  . aspirin 81 MG tablet Take 81 mg by mouth daily.    . carvedilol (COREG) 25 MG tablet TAKE 1 TABLET TWICE A DAY FOR 90 DAY(S)  1  . glimepiride (AMARYL) 4 MG tablet Take 4 mg by mouth 2 (two) times daily.     . hydrALAZINE (APRESOLINE) 50 MG tablet Take 50 mg by mouth 2 (two) times daily.  3  . losartan (COZAAR) 100 MG tablet Take 0.5 tablets (50 mg total) by mouth daily. 30 tablet 11  . metFORMIN (GLUCOPHAGE) 1000 MG tablet Take 1,000 mg by mouth 2 (two) times daily with a meal.    . pravastatin (PRAVACHOL) 20 MG tablet Take 20 mg by mouth every evening.      No current facility-administered medications for this visit.      Past Medical History:  Diagnosis Date  . Acute diastolic CHF (congestive heart failure) ([place]) 01/21/2016  . B12 deficiency   . Bladder cancer ([place])   . Closed head injury   . Essential hypertension, benign   . GERD (gastroesophageal reflux disease)   . Head injury, unspecified   . Memory difficulty    d/t head injury  .  Osteoporosis, unspecified   . Paroxysmal atrial fibrillation (HCC)    during septic shock, none since  . Pulmonary hypertension   . Pure hypercholesterolemia   . Type II or unspecified type diabetes mellitus without mention of complication, uncontrolled   . Unspecified vitamin D deficiency     Past Surgical History:  Procedure Laterality Date  . APPENDECTOMY  1981  . CARDIAC CATHETERIZATION    . CARDIAC CATHETERIZATION N/A 08/13/2015   Procedure: Right/Left Heart Cath and Coronary Angiography;  Surgeon: Peter M Martinique, MD;  Location: [facility];  Service: Cardiovascular;  Laterality: N/A;  . TONSILLECTOMY    . TOTAL ABDOMINAL HYSTERECTOMY  1981    Social History   Social History  . Marital status: Married    Spouse name: N/A  . Number of children: 3  . Years of education: N/A   Occupational History  . Not on file.   Social History Main Topics  . Smoking status: Never Smoker  . Smokeless tobacco: Never Used  . Alcohol use No  . Drug use: No  . Sexual activity: Not Currently   Other Topics Concern  . Not on file   Social History Narrative   Married 3 sons   Current work status : retired   No illicit drug use   Alcohol use : No alcohol use   Tobacco use: Never smoke   Education: college  Family History  Problem Relation Age of Onset  . Coronary artery disease Mother     MI  . Heart disease Father     CHF  . Diabetes Sister   . Diabetes Sister   . Thyroid disease Sister   . Arthritis Sister   . Diabetes Sister     ROS: no fevers or chills, productive cough, hemoptysis, dysphasia, odynophagia, melena, hematochezia, dysuria, hematuria, rash, seizure activity, orthopnea, PND, pedal edema, claudication. Remaining systems are negative.  Physical Exam: Well-developed well-nourished in no acute distress.  Skin is warm and dry.  HEENT is normal.  Neck is supple.  Chest is clear to auscultation with normal expansion.  Cardiovascular  exam is regular rate and rhythm.  Abdominal exam nontender or distended. No masses palpated. Extremities show no edema. neuro grossly intact  A/P  1 Chronic diastolic congestive heart failure-Patient appears to be euvolemic on examination. Continue Lasix as needed.  2 hypertension- blood pressure is controlled. She brought her cuff to correlate with ours today. Hers is 20 points higher. We will continue to follow and increase medications as needed.  3 Pulmonary hypertension-followed by pulmonary. 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  4 mild coronary atherosclerosis-continue aspirin and statin.  5 paroxysmal atrial fibrillation-remote in the setting of sepsis.     Kirk Ruths, MD

## 2016-04-16 LAB — BASIC METABOLIC PANEL
BUN: 21 mg/dL (ref 7–25)
CALCIUM: 10.2 mg/dL (ref 8.6–10.4)
CO2: 20 mmol/L (ref 20–31)
Chloride: 97 mmol/L — ABNORMAL LOW (ref 98–110)
Creat: 1.2 mg/dL — ABNORMAL HIGH (ref 0.60–0.93)
Glucose, Bld: 225 mg/dL — ABNORMAL HIGH (ref 65–99)
Potassium: 4.8 mmol/L (ref 3.5–5.3)
SODIUM: 137 mmol/L (ref 135–146)

## 2016-04-20 ENCOUNTER — Encounter: Payer: Self-pay | Admitting: Cardiology

## 2016-04-20 ENCOUNTER — Ambulatory Visit: Payer: Medicare Other | Admitting: Cardiology

## 2016-04-20 ENCOUNTER — Ambulatory Visit (INDEPENDENT_AMBULATORY_CARE_PROVIDER_SITE_OTHER): Payer: Medicare Other | Admitting: Cardiology

## 2016-04-20 VITALS — BP 138/56 | HR 76 | Ht 64.0 in | Wt 135.0 lb

## 2016-04-20 DIAGNOSIS — I10 Essential (primary) hypertension: Secondary | ICD-10-CM | POA: Diagnosis not present

## 2016-04-20 DIAGNOSIS — E7800 Pure hypercholesterolemia, unspecified: Secondary | ICD-10-CM | POA: Diagnosis not present

## 2016-04-20 DIAGNOSIS — I5189 Other ill-defined heart diseases: Secondary | ICD-10-CM

## 2016-04-20 DIAGNOSIS — I519 Heart disease, unspecified: Secondary | ICD-10-CM

## 2016-04-20 NOTE — Patient Instructions (Signed)
Your physician wants you to follow-up in: 6 MONTHS WITH DR CRENSHAW You will receive a reminder letter in the mail two months in advance. If you don't receive a letter, please call our office to schedule the follow-up appointment.   If you need a refill on your cardiac medications before your next appointment, please call your pharmacy.  

## 2016-06-01 ENCOUNTER — Other Ambulatory Visit (INDEPENDENT_AMBULATORY_CARE_PROVIDER_SITE_OTHER): Payer: Medicare Other

## 2016-06-01 ENCOUNTER — Encounter: Payer: Self-pay | Admitting: Pulmonary Disease

## 2016-06-01 ENCOUNTER — Ambulatory Visit (INDEPENDENT_AMBULATORY_CARE_PROVIDER_SITE_OTHER): Payer: Medicare Other | Admitting: Pulmonary Disease

## 2016-06-01 VITALS — BP 148/68 | HR 66 | Ht 64.0 in | Wt 139.0 lb

## 2016-06-01 DIAGNOSIS — I5031 Acute diastolic (congestive) heart failure: Secondary | ICD-10-CM

## 2016-06-01 DIAGNOSIS — I272 Pulmonary hypertension, unspecified: Secondary | ICD-10-CM

## 2016-06-01 DIAGNOSIS — I509 Heart failure, unspecified: Secondary | ICD-10-CM | POA: Diagnosis not present

## 2016-06-01 DIAGNOSIS — I10 Essential (primary) hypertension: Secondary | ICD-10-CM | POA: Diagnosis not present

## 2016-06-01 LAB — BASIC METABOLIC PANEL
BUN: 24 mg/dL — ABNORMAL HIGH (ref 6–23)
CALCIUM: 9.9 mg/dL (ref 8.4–10.5)
CO2: 24 mEq/L (ref 19–32)
Chloride: 99 mEq/L (ref 96–112)
Creatinine, Ser: 1.06 mg/dL (ref 0.40–1.20)
GFR: 54.17 mL/min — AB (ref >60.00)
Glucose, Bld: 200 mg/dL — ABNORMAL HIGH (ref 70–99)
Potassium: 5.1 mEq/L (ref 3.5–5.1)
SODIUM: 133 meq/L — AB (ref 135–145)

## 2016-06-01 LAB — BRAIN NATRIURETIC PEPTIDE: PRO B NATRI PEPTIDE: 803 pg/mL — AB (ref 0.0–100.0)

## 2016-06-01 MED ORDER — SPIRONOLACTONE 25 MG PO TABS: 25.0000 mg | ORAL_TABLET | Freq: Every day | ORAL | 5 refills | Status: DC

## 2016-06-01 NOTE — Patient Instructions (Addendum)
Take your diuretic medicine (Lasix) for the next 2 days Measure your weight daily Take Spironolactone in addition to your other medications, 25 mg once a day Follow-up with cardiology We will have you follow up with our NP in 2-3 weeks to ensure your blood pressure has improved

## 2016-06-01 NOTE — Assessment & Plan Note (Signed)
Her weight is up and she has been experiencing worsening blood pressure control recently leading to increasing dyspnea. This is most likely due to worsening CHF based on her physical exam is consistent with volume overload. I think the prednisone she took recently cause more fluid retention.  Plan: Check pro BNP Counseled to use diuretics for the next 2 days to get back down to her goal weight of 133 pounds Continue Coreg, losartan, hydralazine Add spironolactone

## 2016-06-01 NOTE — Assessment & Plan Note (Signed)
There has been a discrepancy in her readings at home versus what seen in clinic noted recently, today I found that her blood pressure device at home correlated with her markedly elevated blood pressure today in clinic. I believe that this is likely related to recent prednisone use but considering her severe fluid retention in the past and known diastolic heart failure I'm going to add spironolactone for now.  Plan: As above Check BMET

## 2016-06-01 NOTE — Progress Notes (Signed)
Subjective:    Patient ID: Ann Hunter, female    DOB: [birth year], 71 y.o.   MRN: [id number]  Synopsis: referred by Dr. Guadalupe Dawn in 2017 for pulmonary hypertension which was ultimately shown to be due to diastolic heart failure in a 02/2016 RHC at [facility] which showed:  CATH right heart11/30/2017 [place] Component Name Value Ref Range  Cardiac Index (l/min/m2) 2.6 L/min/m2  Right Atrium Mean Pressure (mmHg) 10 mmHg   Right Ventricle Systolic Pressure (mmHg) 82 mmHg   Pulmonary Artery Mean Pressure (mmHg) 50 mmHg   Pulmonary Wedge Pressure (mmHg) 26 mmHg   Pulmonary Vascular Resistance (Wood units) 5.4 Wood units      02/2015 HRCT > personally reviewed, mild atelectasis in the bases, no ILD; mosaicism noted  December 2016 pulmonary function testing ratio 77%, FEV1 1.67 L, FVC 2.17 L (73% predicted), total lung capacity 0.34 L (66% protected), ERV 10% predicted DLCO 14.64 (60% predicted).  May 2017 right heart catheterization right atrial mean pressure 2 mmHg, right ventricular systolic pressure 56, pulmonary artery mean pressure 42 mmHg, pulmonary capillary wedge pressure mean 13 mmHg, left ventricular end-diastolic pressure 14.  No coronary artery disease.  Blood work performed by Dr. Chase Caller in May 2017 shows a slightly elevated antineutrophilic antibody panel at 1-40, CCP negative, double-stranded DNA negative, rheumatoid factor negative, SSA and SSB both negative. Scleroderma antibody negative.  09/04/2015 VQ scan no evidence of pulmonary embolism.  June 2017 respiratory muscle strength testing: MIP 49 cm H20 (37% pred), MEP -35 cm H20, 53% pred  She was admitted to the hospital in October 2017 after taking 7 days of Letairis. She had increasing shortness of breath, hypoxemia and bilateral pleural effusions.  01/20/2016 echocardiogram showed normal LVEF, grade 2 diastolic dysfunction, normal RV size and function, apparently transvalvular velocity around  the tricuspid valve is within normal range  October 2017 CT scan chest angiogram study no pulmonary wasn't, bilateral pleural effusions, trace edema bases   HPI Chief Complaint  Patient presents with  . Follow-up    pt c/o worsening sob with any exertion, sob when bending over, occasional chest tightness.     Ann Hunter says she has had some dyspnea recently.  This has been associated with leg swelling.  No recent cough, chest congestion.    She has had pain in her jaw recently.  She notes that when she was prescribed prednisone recently she had some weight gain by about 6 pounds.  She stopped taking the prednisone around a week ago.    She says her BP has been running 190s/80's at home  Past Medical History:  Diagnosis Date  . Acute diastolic CHF (congestive heart failure) ([place]) 01/21/2016  . B12 deficiency   . Bladder cancer ([place])   . Closed head injury   . Essential hypertension, benign   . GERD (gastroesophageal reflux disease)   . Head injury, unspecified   . Memory difficulty    d/t head injury  . Osteoporosis, unspecified   . Paroxysmal atrial fibrillation (HCC)    during septic shock, none since  . Pulmonary hypertension   . Pure hypercholesterolemia   . Type II or unspecified type diabetes mellitus without mention of complication, uncontrolled   . Unspecified vitamin D deficiency       Review of Systems  Constitutional: Negative for chills, fatigue and fever.  HENT: Negative for rhinorrhea, sinus pressure and sneezing.   Respiratory: Positive for shortness of breath. Negative for cough and wheezing.   Cardiovascular:  Positive for leg swelling. Negative for chest pain and palpitations.       Objective:   Physical Exam  Vitals:   06/01/16 1051  BP: (!) 148/68  Pulse: 66  SpO2: 95%  Weight: 139 lb (63 kg)  Height: 5\' 4"  (1.626 m)   Repeat blood pressure check manual by me: 172/78; home device 180/86  Gen: well appearing HENT: OP clear, TM's clear,  neck supple PULM: CTA B, normal percussion CV: RRR, no mgr, JVD elevated, trace ankle edema GI: BS+, soft, nontender Derm: no cyanosis or rash Psyche: normal mood and affect   Records from Cardiology 03/2016 and January 2018 reviewed where she was seen for diastolic heart failure and hypertension.  They noted a discrepancy between her home device and her blood pressure readings in clinic.   BMET    Component Value Date/Time   NA 137 04/15/2016 1356   K 4.8 04/15/2016 1356   CL 97 (L) 04/15/2016 1356   CO2 20 04/15/2016 1356   GLUCOSE 225 (H) 04/15/2016 1356   BUN 21 04/15/2016 1356   CREATININE 1.20 (H) 04/15/2016 1356   CALCIUM 10.2 04/15/2016 1356   GFRNONAA 37 (L) 01/22/2016 0309   GFRAA 43 (L) 01/22/2016 0309   01/2016 BNP 576     Assessment & Plan:   Acute diastolic CHF (congestive heart failure) (HCC) Her weight is up and she has been experiencing worsening blood pressure control recently leading to increasing dyspnea. This is most likely due to worsening CHF based on her physical exam is consistent with volume overload. I think the prednisone she took recently cause more fluid retention.  Plan: Check pro BNP Counseled to use diuretics for the next 2 days to get back down to her goal weight of 133 pounds Continue Coreg, losartan, hydralazine Add spironolactone  Essential hypertension There has been a discrepancy in her readings at home versus what seen in clinic noted recently, today I found that her blood pressure device at home correlated with her markedly elevated blood pressure today in clinic. I believe that this is likely related to recent prednisone use but considering her severe fluid retention in the past and known diastolic heart failure I'm going to add spironolactone for now.  Plan: As above Check BMET  Pulmonary hypertension Due to diastolic heart faliure, as above    Current Outpatient Prescriptions:  .  aspirin 81 MG tablet, Take 81 mg by mouth  daily., Disp: , Rfl:  .  carvedilol (COREG) 25 MG tablet, TAKE 1 TABLET TWICE A DAY FOR 90 DAY(S), Disp: , Rfl: 1 .  glimepiride (AMARYL) 4 MG tablet, Take 4 mg by mouth 2 (two) times daily. , Disp: , Rfl:  .  hydrALAZINE (APRESOLINE) 50 MG tablet, Take 50 mg by mouth 2 (two) times daily., Disp: , Rfl: 3 .  losartan (COZAAR) 100 MG tablet, Take 0.5 tablets (50 mg total) by mouth daily., Disp: 30 tablet, Rfl: 11 .  metFORMIN (GLUCOPHAGE) 1000 MG tablet, Take 1,000 mg by mouth 2 (two) times daily with a meal., Disp: , Rfl:  .  pravastatin (PRAVACHOL) 20 MG tablet, Take 20 mg by mouth every evening. , Disp: , Rfl:  .  spironolactone (ALDACTONE) 25 MG tablet, Take 1 tablet (25 mg total) by mouth daily., Disp: 30 tablet, Rfl: 5

## 2016-06-01 NOTE — Assessment & Plan Note (Signed)
Due to diastolic heart faliure, as above

## 2016-06-15 ENCOUNTER — Other Ambulatory Visit (INDEPENDENT_AMBULATORY_CARE_PROVIDER_SITE_OTHER): Payer: Medicare Other

## 2016-06-15 ENCOUNTER — Ambulatory Visit (INDEPENDENT_AMBULATORY_CARE_PROVIDER_SITE_OTHER): Payer: Medicare Other | Admitting: Adult Health

## 2016-06-15 ENCOUNTER — Encounter: Payer: Self-pay | Admitting: Adult Health

## 2016-06-15 DIAGNOSIS — I519 Heart disease, unspecified: Secondary | ICD-10-CM

## 2016-06-15 DIAGNOSIS — I5189 Other ill-defined heart diseases: Secondary | ICD-10-CM

## 2016-06-15 DIAGNOSIS — I272 Pulmonary hypertension, unspecified: Secondary | ICD-10-CM | POA: Diagnosis not present

## 2016-06-15 LAB — BASIC METABOLIC PANEL
BUN: 21 mg/dL (ref 6–23)
CHLORIDE: 102 meq/L (ref 96–112)
CO2: 23 meq/L (ref 19–32)
Calcium: 10.2 mg/dL (ref 8.4–10.5)
Creatinine, Ser: 1.02 mg/dL (ref 0.40–1.20)
GFR: 56.62 mL/min — ABNORMAL LOW (ref >60.00)
Glucose, Bld: 150 mg/dL — ABNORMAL HIGH (ref 70–99)
Potassium: 5.5 mEq/L — ABNORMAL HIGH (ref 3.5–5.1)
SODIUM: 133 meq/L — AB (ref 135–145)

## 2016-06-15 NOTE — Addendum Note (Signed)
Addended by: Parke Poisson E on: 06/15/2016 11:52 AM   Modules accepted: Orders

## 2016-06-15 NOTE — Assessment & Plan Note (Signed)
Cont on current regimen  

## 2016-06-15 NOTE — Patient Instructions (Addendum)
Glad you are feeling better.  Continue on current regimen . Labs today .  Blood pressure is elevated again today , follow up with Cardiology for this.  Follow up Dr. Lake Bells in 3 months and As needed   Please contact office for sooner follow up if symptoms do not improve or worsen or seek emergency care

## 2016-06-15 NOTE — Assessment & Plan Note (Signed)
Recent decompensation ,improved with diuresis  Check bmet   Plan  Patient Instructions  Glad you are feeling better.  Continue on current regimen . Labs today .  Blood pressure is elevated again today , follow up with Cardiology for this.  Follow up Dr. Lake Bells in 3 months and As needed   Please contact office for sooner follow up if symptoms do not improve or worsen or seek emergency care

## 2016-06-15 NOTE — Progress Notes (Signed)
@Patient  ID: Ann Hunter, female    DOB: October 14, 1945, 71 y.o.   MRN: [id number]  Chief Complaint  Patient presents with  . Follow-up    CHF /pulm HTN     Referring provider: Lillard Anes  HPI: 71 year old female followed for pulmonary hypertension and diastolic heart failure  TEST /Events  RHC 03/11/16 PAP 50, RVSP 80  02/2015 HRCT > personally reviewed, mild atelectasis in the bases, no ILD; mosaicism noted  December 2016 pulmonary function testing ratio 77%, FEV1 1.67 L, FVC 2.17 L (73% predicted), total lung capacity 0.34 L (66% protected), ERV 10% predicted DLCO 14.64 (60% predicted).  May 2017 right heart catheterization right atrial mean pressure 2 mmHg, right ventricular systolic pressure 56, pulmonary artery mean pressure 42 mmHg, pulmonary capillary wedge pressure mean 13 mmHg, left ventricular end-diastolic pressure 14.  No coronary artery disease.  Blood work performed by Dr. Chase Caller in May 2017 shows a slightly elevated antineutrophilic antibody panel at 1-40, CCP negative, double-stranded DNA negative, rheumatoid factor negative, SSA and SSB both negative. Scleroderma antibody negative.  09/04/2015 VQ scan no evidence of pulmonary embolism.  June 2017 respiratory muscle strength testing: MIP 49 cm H20 (37% pred), MEP -35 cm H20, 53% pred  She was admitted to the hospital in October 2017 after taking 7 days of Letairis. She had increasing shortness of breath, hypoxemia and bilateral pleural effusions.  01/20/2016 echocardiogram showed normal LVEF, grade 2 diastolic dysfunction, normal RV size and function, apparently transvalvular velocity around the tricuspid valve is within normal range  October 2017 CT scan chest angiogram study no pulmonary wasn't, bilateral pleural effusions, trace edema bases  06/15/2016 Follow Up : CHF /Pulmonary HTN  Patient returns for a two-week follow-up. Last visit. Patient was suspected to have some fluid overload  with diastolic congestive heart failure decompensation. She was recommended to take 2 days lasix  And  Started on spironolactone 25mg  daily . Feels this really helped.  She had associated weight gain. BNP was elevated.  Weight went down 8 lbs, ankle  swelling got better. Dyspnea got better.   No chest pain , orthopnea or increased edema.     Allergies  Allergen Reactions  . Advil [Ibuprofen] Swelling  . Codeine Shortness Of Breath    Throat Swelling  . Letairis [Ambrisentan] Shortness Of Breath  . Ranitidine Anaphylaxis  . Sitagliptin Anaphylaxis and Swelling  . Ace Inhibitors   . Aleve [Naproxen Sodium]   . Boniva [Ibandronic Acid] Diarrhea    Swelling   . Clonidine Derivatives   . Januvia [Sitagliptin Phosphate]   . Penicillins Swelling    Has patient had a PCN reaction causing immediate rash, facial/tongue/throat swelling, SOB or lightheadedness with hypotension: Yes Has patient had a PCN reaction causing severe rash involving mucus membranes or skin necrosis: No Has patient had a PCN reaction that required hospitalization No Has patient had a PCN reaction occurring within the last 10 years: No If all of the above answers are "NO", then may proceed with Cephalosporin use.    . Sulfa Antibiotics     Throat swells    There is no immunization history for the selected administration types on file for this patient.  Past Medical History:  Diagnosis Date  . Acute diastolic CHF (congestive heart failure) ([place]) 01/21/2016  . B12 deficiency   . Bladder cancer ([place])   . Closed head injury   . Essential hypertension, benign   . GERD (gastroesophageal reflux disease)   . Head injury,  unspecified   . Memory difficulty    d/t head injury  . Osteoporosis, unspecified   . Paroxysmal atrial fibrillation (HCC)    during septic shock, none since  . Pulmonary hypertension   . Pure hypercholesterolemia   . Type II or unspecified type diabetes mellitus without mention of  complication, uncontrolled   . Unspecified vitamin D deficiency     Tobacco History: History  Smoking Status  . Never Smoker  Smokeless Tobacco  . Never Used   Counseling given: Not Answered   Outpatient Encounter Prescriptions as of 06/15/2016  Medication Sig  . aspirin 81 MG tablet Take 81 mg by mouth daily.  . carvedilol (COREG) 25 MG tablet TAKE 1 TABLET TWICE A DAY FOR 90 DAY(S)  . furosemide (LASIX) 20 MG tablet Take 20 mg by mouth daily as needed.  Marland Kitchen glimepiride (AMARYL) 4 MG tablet Take 4 mg by mouth 2 (two) times daily.   . hydrALAZINE (APRESOLINE) 50 MG tablet Take 50 mg by mouth 2 (two) times daily.  Marland Kitchen losartan (COZAAR) 100 MG tablet Take 0.5 tablets (50 mg total) by mouth daily.  . metFORMIN (GLUCOPHAGE) 1000 MG tablet Take 1,000 mg by mouth 2 (two) times daily with a meal.  . pravastatin (PRAVACHOL) 20 MG tablet Take 20 mg by mouth every evening.   Marland Kitchen spironolactone (ALDACTONE) 25 MG tablet Take 1 tablet (25 mg total) by mouth daily.   No facility-administered encounter medications on file as of 06/15/2016.      Review of Systems  Constitutional:   No  weight loss, night sweats,  Fevers, chills,  +fatigue, or  lassitude.  HEENT:   No headaches,  Difficulty swallowing,  Tooth/dental problems, or  Sore throat,                No sneezing, itching, ear ache, nasal congestion, post nasal drip,   CV:  No chest pain,  Orthopnea, PND, swelling in lower extremities, anasarca, dizziness, palpitations, syncope.   GI  No heartburn, indigestion, abdominal pain, nausea, vomiting, diarrhea, change in bowel habits, loss of appetite, bloody stools.   Resp:  .  No chest wall deformity  Skin: no rash or lesions.  GU: no dysuria, change in color of urine, no urgency or frequency.  No flank pain, no hematuria   MS:  No joint pain or swelling.  No decreased range of motion.  No back pain.    Physical Exam  BP (!) 152/64 (BP Location: Left Arm, Cuff Size: Normal)   Pulse 66    Ht 5' 4.5" (1.638 m)   Wt 135 lb 3.2 oz (61.3 kg)   SpO2 98%   BMI 22.85 kg/m   GEN: A/Ox3; pleasant , NAD, elderly    HEENT:  [place]/AT,  EACs-clear, TMs-wnl, NOSE-clear, THROAT-clear, no lesions, no postnasal drip or exudate noted.   NECK:  Supple w/ fair ROM; no JVD; normal carotid impulses w/o bruits; no thyromegaly or nodules palpated; no lymphadenopathy.    RESP  Clear  P & A; w/o, wheezes/ rales/ or rhonchi. no accessory muscle use, no dullness to percussion  CARD:  RRR, no m/r/g, no peripheral edema, pulses intact, no cyanosis or clubbing.  GI:   Soft & nt; nml bowel sounds; no organomegaly or masses detected.   Musco: Warm bil, no deformities or joint swelling noted.   Neuro: alert, no focal deficits noted.    Skin: Warm, no lesions or rashes  Psych:  No change in mood or affect. No  depression or anxiety.  No memory loss.  Lab Results:   BMET   BNP    Component Value Date/Time   BNP 576.4 (H) 01/18/2016 0852    ProBNP    Component Value Date/Time   PROBNP 803.0 (H) 06/01/2016 1141    Imaging: No results found.   Assessment & Plan:   Diastolic dysfunction Recent decompensation ,improved with diuresis  Check bmet   Plan  Patient Instructions  Glad you are feeling better.  Continue on current regimen . Labs today .  Blood pressure is elevated again today , follow up with Cardiology for this.  Follow up Dr. Lake Bells in 3 months and As needed   Please contact office for sooner follow up if symptoms do not improve or worsen or seek emergency care      Pulmonary hypertension Cont on current regimen      Rexene Edison, NP 06/15/2016

## 2016-06-16 ENCOUNTER — Telehealth: Payer: Self-pay | Admitting: Adult Health

## 2016-06-16 DIAGNOSIS — I5189 Other ill-defined heart diseases: Secondary | ICD-10-CM

## 2016-06-16 MED ORDER — FUROSEMIDE 20 MG PO TABS: 20.0000 mg | ORAL_TABLET | Freq: Every day | ORAL | 0 refills | Status: DC

## 2016-06-16 NOTE — Telephone Encounter (Signed)
Take today , then hold going forward.  Make sure labs are in for stat bmet on Friday

## 2016-06-16 NOTE — Telephone Encounter (Signed)
Spoke with the pt's spouse  He states spoke with TP this am, and was advised to d/c aldactone and start lasix daily  He states that pt already took her aldactone this am, and wants to know if she should go ahead and take lasix now, or start tomorrow. Please advise, thanks!

## 2016-06-16 NOTE — Telephone Encounter (Signed)
Spoke with pt. She is aware of TP's response. Nothing further was needed at this time.

## 2016-06-18 ENCOUNTER — Ambulatory Visit (INDEPENDENT_AMBULATORY_CARE_PROVIDER_SITE_OTHER): Payer: Medicare Other | Admitting: Physician Assistant

## 2016-06-18 ENCOUNTER — Encounter: Payer: Self-pay | Admitting: Physician Assistant

## 2016-06-18 ENCOUNTER — Other Ambulatory Visit (INDEPENDENT_AMBULATORY_CARE_PROVIDER_SITE_OTHER): Payer: Medicare Other

## 2016-06-18 VITALS — BP 152/74 | HR 64 | Ht 64.4 in | Wt 133.0 lb

## 2016-06-18 DIAGNOSIS — I5032 Chronic diastolic (congestive) heart failure: Secondary | ICD-10-CM | POA: Diagnosis not present

## 2016-06-18 DIAGNOSIS — R2689 Other abnormalities of gait and mobility: Secondary | ICD-10-CM

## 2016-06-18 DIAGNOSIS — I519 Heart disease, unspecified: Secondary | ICD-10-CM | POA: Diagnosis not present

## 2016-06-18 DIAGNOSIS — E785 Hyperlipidemia, unspecified: Secondary | ICD-10-CM | POA: Diagnosis not present

## 2016-06-18 DIAGNOSIS — E118 Type 2 diabetes mellitus with unspecified complications: Secondary | ICD-10-CM | POA: Diagnosis not present

## 2016-06-18 DIAGNOSIS — I480 Paroxysmal atrial fibrillation: Secondary | ICD-10-CM | POA: Diagnosis not present

## 2016-06-18 DIAGNOSIS — I10 Essential (primary) hypertension: Secondary | ICD-10-CM | POA: Diagnosis not present

## 2016-06-18 DIAGNOSIS — I5189 Other ill-defined heart diseases: Secondary | ICD-10-CM

## 2016-06-18 LAB — BASIC METABOLIC PANEL
BUN: 30 mg/dL — AB (ref 6–23)
CHLORIDE: 101 meq/L (ref 96–112)
CO2: 24 meq/L (ref 19–32)
Calcium: 10.2 mg/dL (ref 8.4–10.5)
Creatinine, Ser: 1.23 mg/dL — ABNORMAL HIGH (ref 0.40–1.20)
GFR: 45.62 mL/min — ABNORMAL LOW (ref >60.00)
GLUCOSE: 218 mg/dL — AB (ref 70–99)
POTASSIUM: 5.2 meq/L — AB (ref 3.5–5.1)
SODIUM: 136 meq/L (ref 135–145)

## 2016-06-18 NOTE — Progress Notes (Signed)
Called spoke with patient's spouse.  Advised of lab results / recs as stated by TP.  Spouse verbalized understanding and denied any questions.  Pt is not scheduled for ov in 2 weeks - spouse will have patient call the office on Monday to schedule appt.

## 2016-06-18 NOTE — Patient Instructions (Addendum)
Medication Instructions:   STOP TAKING SPIRONOLACTONE 25 MG   START TAKING LASIX AS NEEDED FOR SWELLING  OR WEIGHT GAIN OF 3 LBS  IN 24 HOURS  5 LBS IN A  WEEK    If you need a refill on your cardiac medications before your next appointment, please call your pharmacy.  Labwork: NONE ORDERED  TODAY    Testing/Procedures:  NONE ORDERED  TODAY    Follow-Up:  IN 2 TO 3 MONTHS WITH DR CRENSHAW    Any Other Special Instructions Will Be Listed Below (If Applicable).  YOU HAVE BEEN RECCOMMENDED TO PURCHASE A NEW  DIGITAL BLOOD PRESSURE CUFF   AND TO START TO KEEP A BLOOD PRESSURE DIARY     1. START TAKING BLOOD PRESSURE TWICE A DAY        2.  BLOOD  PRESSURE CHECK 2 HOURS  AFTER TAKING  MEDICINES    3.   BLOOD PRESSURE CHECKED AT A FIX TIME REGULARLY  AT NIGHT BEFORE GOING TO BED

## 2016-06-18 NOTE — Progress Notes (Signed)
Cardiology Office Note    Date:  06/19/2016   ID:  Ann Hunter, DOB [birth year], MRN [id number]  PCP:  Lillard Anes, MD  Cardiologist:  Dr. Stanford Breed  Chief Complaint  Patient presents with  . Follow-up    seen for Dr. Stanford Breed, add-on by patient to discuss recent medication changes    History of Present Illness:  Ann Hunter is a 71 y.o. female with PMH of HTN, HLD, DM II, paroxysmal atrial fibrillation and chronic diastolic heart failure. She had episode of atrial fibrillation in the setting of urosepsis. High resolution CT in November 2016 did not show interstitial lung disease. There was note of 3 vessel coronary artery calcification. Cardiopulmonary stress test in March 2017 suggestive functional limitation due to circulatory limitation. Findings suggestive of diastolic dysfunction and pulmonary hypertension. Cardiac cath performed in May 2017 showed no obstructive CAD, normal LV function, moderate pulmonary hypertension and normal LV filling pressure. She had a V/Q in May 2017 that was negative. Echocardiogram obtained in October 2017 showed a normal LV function, grade 2 diastolic dysfunction, mild LAE. Chest CT in October 2070 showed no PE, pleural effusions and mild LAD edema. Right heart cath at [facility] in November 2017 showed RA 10, PA mean 50, and PCWP 26. Dyspnea felt to be related to chronic diastolic heart failure. Patient was last seen on 04/20/2016, she was euvolemic at the time, it was recommended she use Lasix as needed. Her blood pressure was mildly elevated, she did bring her blood pressure cuff during the last visit, hers was 20 points higher. six-month follow-up was recommended.  Patient was most recently seen by pulmonology service of 06/15/2016 for volume overload symptoms. She was recommended to take Lasix for 2 days then start spironolactone 25 mg daily. Unfortunately, when the spironolactone combined with her losartan, this has caused her potassium level to  increase to 5.5. She has since been instructed to stop spironolactone and started on Lasix 20 mg daily. She has been taking Lasix daily for the past 3 days, today's lab does show there is some worsening of the renal function. Although she did have history of elevated wedge pressure on the right heart cath before, however she has been previously doing well on PRN dosing of lasix. She says she measured her weight on daily basis and takes extra dose of Lasix if her weight increases by more than 3 pounds overnight or 5 pounds in a single week. I think it would be more beneficial for her to resume the Lasix on an as-needed basis instead. Her blood pressure today remains elevated, there is some confusion as to what is her true blood pressure. Based on Dr. Jacalyn Lefevre note, his symptoms her home blood pressure cuff is at least 20 mmHg higher than our office blood pressure cuff, however based on Dr. Anastasia Pall note, her home blood pressure device actually correlate with her elevated blood pressure in the clinic. She will obtain a new blood pressure cuff and keep a blood pressure diary with 2 recorded on daily basis. I asked her to record her blood pressure 2 hours after her morning medication and at a fixed time every night. I attempted to add blood pressure medication in the clinic, however she is hesitant to do so at this time. I told her to keep a blood pressure diary and take it to her next office visit, if her blood pressure is elevated based on her blood pressure diary and her new blood pressure cuff correlate with our office  reading, that she ought to start on new blood pressure medication. Also note, she has a lot of allergies, her new medication will need to be carefully protected. Furthermore, she is quite concerned about her imbalance issue and her frailty. She requested physical therapy which I think it is reasonable. She has not had any significant fall however she is concerned imbalance issue will continue to  get worse, she is not getting much activity at home. I will refer her for physical therapy. She prefers a location close to [employer]. We did ambulate her in the hallway today, she was able to maintain O2 saturation at greater than 90% with ambulation.   Past Medical History:  Diagnosis Date  . Acute diastolic CHF (congestive heart failure) ([place]) 01/21/2016  . B12 deficiency   . Bladder cancer ([place])   . Closed head injury   . Essential hypertension, benign   . GERD (gastroesophageal reflux disease)   . Head injury, unspecified   . Memory difficulty    d/t head injury  . Osteoporosis, unspecified   . Paroxysmal atrial fibrillation (HCC)    during septic shock, none since  . Pulmonary hypertension   . Pure hypercholesterolemia   . Type II or unspecified type diabetes mellitus without mention of complication, uncontrolled   . Unspecified vitamin D deficiency     Past Surgical History:  Procedure Laterality Date  . APPENDECTOMY  1981  . CARDIAC CATHETERIZATION    . CARDIAC CATHETERIZATION N/A 08/13/2015   Procedure: Right/Left Heart Cath and Coronary Angiography;  Surgeon: Peter M Martinique, MD;  Location: [facility];  Service: Cardiovascular;  Laterality: N/A;  . TONSILLECTOMY    . TOTAL ABDOMINAL HYSTERECTOMY  1981    Current Medications: Outpatient Medications Prior to Visit  Medication Sig Dispense Refill  . aspirin 81 MG tablet Take 81 mg by mouth daily.    . carvedilol (COREG) 25 MG tablet TAKE 1 TABLET TWICE A DAY FOR 90 DAY(S)  1  . furosemide (LASIX) 20 MG tablet Take 20 mg by mouth daily as needed for edema.    Marland Kitchen glimepiride (AMARYL) 4 MG tablet Take 4 mg by mouth 2 (two) times daily.     . hydrALAZINE (APRESOLINE) 50 MG tablet Take 50 mg by mouth 2 (two) times daily.  3  . losartan (COZAAR) 100 MG tablet Take 0.5 tablets (50 mg total) by mouth daily. 30 tablet 11  . metFORMIN (GLUCOPHAGE) 1000 MG tablet Take 1,000 mg by mouth 2 (two) times daily with a meal.    .  pravastatin (PRAVACHOL) 20 MG tablet Take 20 mg by mouth every evening.     . furosemide (LASIX) 20 MG tablet Take 1 tablet (20 mg total) by mouth daily. 90 tablet 0  . spironolactone (ALDACTONE) 25 MG tablet Take 1 tablet (25 mg total) by mouth daily. 30 tablet 5   No facility-administered medications prior to visit.      Allergies:   Advil [ibuprofen]; Codeine; Letairis [ambrisentan]; Ranitidine; Sitagliptin; Ace inhibitors; Aleve [naproxen sodium]; Boniva [ibandronic acid]; Clonidine derivatives; Januvia [sitagliptin phosphate]; Penicillins; and Sulfa antibiotics   Social History   Social History  . Marital status: Married    Spouse name: N/A  . Number of children: 3  . Years of education: N/A   Social History Main Topics  . Smoking status: Never Smoker  . Smokeless tobacco: Never Used  . Alcohol use No  . Drug use: No  . Sexual activity: Not Currently   Other  Topics Concern  . None   Social History Narrative   Married 3 sons   Current work status : retired   No illicit drug use   Alcohol use : No alcohol use   Tobacco use: Never smoke   Education: college                 Family History:  The patient's family history includes Arthritis in her sister; Coronary artery disease in her mother; Diabetes in her sister, sister, and sister; Heart disease in her father; Thyroid disease in her sister.   ROS:   Please see the history of present illness.    ROS All other systems reviewed and are negative.   PHYSICAL EXAM:   VS:  BP (!) 152/74   Pulse 64   Ht 5' 4.4" (1.636 m)   Wt 133 lb (60.3 kg)   BMI 22.55 kg/m    GEN: Well nourished, well developed, in no acute distress  HEENT: normal  Neck: no JVD, carotid bruits, or masses Cardiac: RRR; no murmurs, rubs, or gallops,no edema  Respiratory:  clear to auscultation bilaterally, normal work of breathing GI: soft, nontender, nondistended, + BS MS: no deformity or atrophy  Skin: warm and dry, no rash Neuro:  Alert  and Oriented x 3, Strength and sensation are intact Psych: euthymic mood, full affect  Wt Readings from Last 3 Encounters:  06/18/16 133 lb (60.3 kg)  06/15/16 135 lb 3.2 oz (61.3 kg)  06/01/16 139 lb (63 kg)      Studies/Labs Reviewed:   EKG:  EKG is not ordered today.   Recent Labs: 09/23/2015: TSH 1.27 01/18/2016: B Natriuretic Peptide 576.4 01/19/2016: ALT 14 01/20/2016: Hemoglobin 9.7; Platelets 273 06/01/2016: Pro B Natriuretic peptide (BNP) 803.0 06/18/2016: BUN 30; Creatinine, Ser 1.23; Potassium 5.2; Sodium 136   Lipid Panel No results found for: CHOL, TRIG, HDL, CHOLHDL, VLDL, LDLCALC, LDLDIRECT  Additional studies/ records that were reviewed today include:   Cardiopulmonary stress test 06/18/2015 CPX: The RER of 1.41 indicates a maximal effort. The peak VO2 is moderate to severely reduced at 9.1 ml/kg/min (51% of the age/gender/weight matched sedentary norm). When adjusted to the patients ideal body weight of 140.4 lb (63.7 kg) the peak VO2 is 9.6 ml/kg (ibw)/min (52.7% of the IBW adjusted predicted). This suggests her weight is likely not the etiology of dyspnea. The VO2 at the ventilatory threshold is low-normal at 40% of the predicted peak VO2. At peak exercise the ventilation was 58% of the measured MVV, indicating ventilatory reserve remained. As mentioned above there was an inadequate HR response to the exercise with a HR reserve of 57 bpm. The O2pulse (a surrogate for stroke volume) remained low and flat at 6 ml/beat (75% of predicted). The VE/VCO2 slope is elevated and indicates excessive dead space ventilation. The oxygen uptake efficiency slope (OUES) is severely reduced and reflects the patient's measured functional capacity.    Conclusion: Exercise testing with gas exchange demonstrates moderate to severe functional impairment when compared to matched sedentary norms. Pre-exercise spirometry suggests mild obstructive/restrictive lung patterns with no  evidence of exercise-induced bronchospasm. Patient appears to have strong evidence of circulatory limitation. Several variables suggests diastolic dysfunction and pulmonary hypertension are likely primary causes of patient's limitations. Further evaluation Recommended.    Cath 08/13/2015 Conclusion    Prox RCA to Mid RCA lesion, 10% stenosed.  Prox LAD to Mid LAD lesion, 30% stenosed.  The left ventricular systolic function is normal.   1. Nonobstructive CAD  2. Normal LV function 3. Moderate pulmonary HTN with normal LV filling pressures.  Plan: medical management.       [place] 11/17 showed RA 10, PA mean 50 and PCWP 26    Echo 01/20/2016 LV EF: 55% -   60%  Study Conclusions  - Left ventricle: The cavity size was normal. Wall thickness was   increased in a pattern of moderate LVH. Systolic function was   normal. The estimated ejection fraction was in the range of 55%   to 60%. Wall motion was normal; there were no regional wall   motion abnormalities. Features are consistent with a pseudonormal   left ventricular filling pattern, with concomitant abnormal   relaxation and increased filling pressure (grade 2 diastolic   dysfunction). Doppler parameters are consistent with high   ventricular filling pressure. - Mitral valve: Calcified annulus. - Left atrium: The atrium was mildly dilated.  Impressions:  - Normal LV systolic function; grade 2 diastolic dysfunction wtih   elevated LV filling pressure; mild LAE; trace MR and TR.   ASSESSMENT:    1. Chronic diastolic heart failure (HCC)   2. Imbalance   3. PAF (paroxysmal atrial fibrillation) ([place])   4. Essential hypertension   5. Hyperlipidemia, unspecified hyperlipidemia type   6. Controlled type 2 diabetes mellitus with complication, without long-term current use of insulin (HCC)      PLAN:  In order of problems listed above:  1. Chronic diastolic heart failure: She is an add-on today to discuss  medication changes recently. It seems she was recently seen by pulmonology service, given her previous right heart cath result, he was concerned she might be accumulating fluid. Therefore she was started on spironolactone 25 mg daily after 2 days of Lasix. Unfortunately since she is also on losartan, after the spironolactone was initiated, she did have hyperkalemia. She was later as instructed to stop the spironolactone and continue on Lasix on daily basis. She has taken the Lasix for the past 3 days, creatinine did trend up slightly. I am concerned she might be dehydrated, we discussed several options today, she prefers to use as needed option for the Lasix. She says she usually quite good at monitoring her weight changes. I will switch her to PRN lasix  2. Imbalance and decreased mobility: She says her mobility has been limited by imbalance and frailty, she requested some physical therapy. I think this is reasonable. We did ambulate her in the hallway today, her O2 saturation fortunately is maintaining at greater than 90%. I will refer her for physical therapy, she prefers a location near [place].  3. PAF: She had a remote history of atrial fibrillation only in the setting of urosepsis. She is currently on aspirin and not on any systemic anticoagulation. Unless she has any recurrence, I will continue on aspirin only.  4. Hypertension: This has been an issue of debate in the last several months. According to Dr. Jacalyn Lefevre note, her home blood pressure cuff reads a systolic blood pressure 20 mmHg higher than our office's blood pressure cuff. However based on Dr. Anastasia Pall note, her home blood pressure cuff actually correspond to the pulmonology clinic blood pressure cuff. She is going to get a new blood pressure cuff. I did discuss potentially add a new blood pressure medication, however she is hesitant to do so at this time. I asked her to keep a blood pressure diary with 2 readings daily, first reading  should be 2 hour after his morning medication, second  reading at a fixed time every night. She is to bring her blood pressure diary and new blood pressure cuff to the next office visit, if her blood pressure remained high at that time she will need to increase blood pressure medication. I would consider increase the hydralazine to 75 mg twice a day.  5. Dyslipidemia: She is on Pravachol.  6. DM 2: Continue on metformin and glimepiride    Medication Adjustments/Labs and Tests Ordered: Current medicines are reviewed at length with the patient today.  Concerns regarding medicines are outlined above.  Medication changes, Labs and Tests ordered today are listed in the Patient Instructions below. Patient Instructions  Medication Instructions:   STOP TAKING SPIRONOLACTONE 25 MG   START TAKING LASIX AS NEEDED FOR SWELLING  OR WEIGHT GAIN OF 3 LBS  IN 24 HOURS  5 LBS IN A  WEEK    If you need a refill on your cardiac medications before your next appointment, please call your pharmacy.  Labwork: NONE ORDERED  TODAY    Testing/Procedures:  NONE ORDERED  TODAY    Follow-Up:  IN 2 TO 3 MONTHS WITH DR CRENSHAW    Any Other Special Instructions Will Be Listed Below (If Applicable).  YOU HAVE BEEN RECCOMMENDED TO PURCHASE A NEW  DIGITAL BLOOD PRESSURE CUFF   AND TO START TO KEEP A BLOOD PRESSURE DIARY     1. START TAKING BLOOD PRESSURE TWICE A DAY        2.  BLOOD  PRESSURE CHECK 2 HOURS  AFTER TAKING  MEDICINES    3.   BLOOD PRESSURE CHECKED AT A FIX TIME REGULARLY  AT NIGHT BEFORE GOING TO BED                                                                                                                                                    Hilbert Corrigan, [place]  06/19/2016 12:35 AM    [facility] [address], [place], [place]  [address] Phone: [phone]; Fax: [phone]

## 2016-06-19 ENCOUNTER — Encounter: Payer: Self-pay | Admitting: Physician Assistant

## 2016-06-22 ENCOUNTER — Telehealth: Payer: Self-pay | Admitting: Cardiology

## 2016-06-22 DIAGNOSIS — R2689 Other abnormalities of gait and mobility: Secondary | ICD-10-CM

## 2016-06-22 NOTE — Telephone Encounter (Signed)
Returning your call. °

## 2016-06-22 NOTE — Telephone Encounter (Signed)
Ann Hunter is calling because [facility] (Physical Therapy Department) states that have not received the referral and if we could please fax it over to [phone].. Thanks

## 2016-06-22 NOTE — Telephone Encounter (Signed)
lmtcb

## 2016-06-22 NOTE — Telephone Encounter (Signed)
Returned call to patient-made aware I would fax to [facility] at # provided.    Patient aware and verbalized understanding.

## 2016-06-22 NOTE — Telephone Encounter (Signed)
Please call,concerning the order she received.

## 2016-06-22 NOTE — Telephone Encounter (Signed)
Returned call to [place] at [facility] regarding order faxed to them for PT.  States they need signed order, last OV note, and demographics faxed to them.     New order placed for external referral, demographics, and ov note.    Will have PA sign and will refax.

## 2016-06-23 NOTE — Telephone Encounter (Signed)
Signed referral faxed to [phone]

## 2016-07-07 ENCOUNTER — Telehealth: Payer: Self-pay | Admitting: *Deleted

## 2016-07-07 NOTE — Telephone Encounter (Signed)
Cardiac rehab orders faxed to the number provided.

## 2016-09-10 ENCOUNTER — Telehealth: Payer: Self-pay | Admitting: Cardiology

## 2016-09-10 NOTE — Telephone Encounter (Signed)
Ann Hunter is calling because he ankles are swollen . She did take a fluid pill before bed and have lost about 2lbs of the fluid , but ankles are still swollen . Wants to knowd should she take another fluid pill . Please Call

## 2016-09-10 NOTE — Telephone Encounter (Signed)
Spoke with pt, she has taken lasix Wednesday and Thursday. She continues to have edema, that improves with elevation. She ate a whole basket of chips at the [employer] and she also reports increasing her fluid intake to help flush out her kidneys. Pt educated on sodium and fluid restrictions. Advised patient to take 2 furosemide now. She will also reduce her fluid intake. She will call back on Monday if she continues to have problems.

## 2016-09-12 ENCOUNTER — Other Ambulatory Visit: Payer: Self-pay | Admitting: Adult Health

## 2016-09-12 DIAGNOSIS — I5189 Other ill-defined heart diseases: Secondary | ICD-10-CM

## 2016-09-14 NOTE — Telephone Encounter (Signed)
Refilling x1 - pt sees cardiology for her heart failure and they are monitoring her lasix rx

## 2016-09-30 ENCOUNTER — Ambulatory Visit: Payer: Medicare Other | Admitting: Pulmonary Disease

## 2016-10-01 NOTE — Progress Notes (Signed)
HPI: FU dyspnea. Patient previously had an episode of atrial fibrillation in the setting of urosepsis. High-resolution CT November 2016 did not show interstitial lung disease. There was note of three-vessel coronary artery calcification. CPX March 2017 suggested functional limitation due to circulatory limitation. Findings suggestive of diastolic dysfunction and pulmonary hypertension. Cath 5/17 showed no obstructive CAD, normal LV function and moderate pulmonary HTN with normal LV filling pressures. VQ 5/17 negative. Echo 10/17 showed normal LV function, grade 2 diastolic dysfunction, mild LAE. Chest CT 10/17 showed no PE; pleural effusions and mild edema. [place] 11/17 showed RA 10, PA mean 50 and PCWP 26. Dyspnea now felt related to chronic diastolic CHF. Since last seen, she notes some dyspnea on exertion but no orthopnea, PND, pedal edema, chest pain or syncope.  Current Outpatient Prescriptions  Medication Sig Dispense Refill  . aspirin 81 MG tablet Take 81 mg by mouth daily.    . carvedilol (COREG) 25 MG tablet TAKE 1 TABLET TWICE A DAY FOR 90 DAY(S)  1  . ciprofloxacin (CIPRO) 500 MG tablet Take 500 mg by mouth 2 (two) times daily.  0  . furosemide (LASIX) 20 MG tablet Take 20 mg by mouth daily as needed for edema.    Marland Kitchen glimepiride (AMARYL) 4 MG tablet Take 4 mg by mouth 2 (two) times daily.     . hydrALAZINE (APRESOLINE) 50 MG tablet Take 50 mg by mouth 2 (two) times daily.  3  . losartan (COZAAR) 100 MG tablet Take 0.5 tablets (50 mg total) by mouth daily. 30 tablet 11  . metFORMIN (GLUCOPHAGE) 500 MG tablet Take 500 mg by mouth 2 (two) times daily.  3  . pravastatin (PRAVACHOL) 20 MG tablet Take 20 mg by mouth every evening.      No current facility-administered medications for this visit.      Past Medical History:  Diagnosis Date  . Acute diastolic CHF (congestive heart failure) ([place]) 01/21/2016  . B12 deficiency   . Bladder cancer ([place])   . Closed head injury   .  Essential hypertension, benign   . GERD (gastroesophageal reflux disease)   . Head injury, unspecified   . Memory difficulty    d/t head injury  . Osteoporosis, unspecified   . Paroxysmal atrial fibrillation (HCC)    during septic shock, none since  . Pulmonary hypertension ([place])   . Pure hypercholesterolemia   . Type II or unspecified type diabetes mellitus without mention of complication, uncontrolled   . Unspecified vitamin D deficiency     Past Surgical History:  Procedure Laterality Date  . APPENDECTOMY  1981  . CARDIAC CATHETERIZATION    . CARDIAC CATHETERIZATION N/A 08/13/2015   Procedure: Right/Left Heart Cath and Coronary Angiography;  Surgeon: Peter M Martinique, MD;  Location: [facility];  Service: Cardiovascular;  Laterality: N/A;  . TONSILLECTOMY    . TOTAL ABDOMINAL HYSTERECTOMY  1981    Social History   Social History  . Marital status: Married    Spouse name: N/A  . Number of children: 3  . Years of education: N/A   Occupational History  . Not on file.   Social History Main Topics  . Smoking status: Never Smoker  . Smokeless tobacco: Never Used  . Alcohol use No  . Drug use: No  . Sexual activity: Not Currently   Other Topics Concern  . Not on file   Social History Narrative   Married 3 sons  Current work status : retired   No illicit drug use   Alcohol use : No alcohol use   Tobacco use: Never smoke   Education: college                Family History  Problem Relation Age of Onset  . Coronary artery disease Mother        MI  . Heart disease Father        CHF  . Diabetes Sister   . Diabetes Sister   . Thyroid disease Sister   . Arthritis Sister   . Diabetes Sister     ROS: no fevers or chills, productive cough, hemoptysis, dysphasia, odynophagia, melena, hematochezia, dysuria, hematuria, rash, seizure activity, orthopnea, PND, pedal edema, claudication. Remaining systems are negative.  Physical Exam: Well-developed  well-nourished in no acute distress.  Skin is warm and dry.  HEENT is normal.  Neck is supple.  Chest is clear to auscultation with normal expansion.  Cardiovascular exam is regular rate and rhythm.  Abdominal exam nontender or distended. No masses palpated. Extremities show no edema. neuro grossly intact  ECG- sinus rhythm at a rate of 60. Nonspecific ST changes. personally reviewed  A/P  1 chronic diastolic congestive heart failure-patient appears to be euvolemic on examination. Continue present dose of diuretics.  2 hypertension-blood pressure is controlled. Continue present medications.  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 Continue diuresis as needed.  4 history of mild coronary atherosclerosis-continue aspirin and statin.  5 paroxysmal atrial fibrillation-remote and occurred in the setting of urosepsis.  Kirk Ruths, MD

## 2016-10-07 ENCOUNTER — Encounter: Payer: Self-pay | Admitting: Cardiology

## 2016-10-07 ENCOUNTER — Ambulatory Visit (INDEPENDENT_AMBULATORY_CARE_PROVIDER_SITE_OTHER): Payer: Medicare Other | Admitting: Cardiology

## 2016-10-07 VITALS — BP 132/68 | HR 60 | Ht 64.0 in | Wt 137.0 lb

## 2016-10-07 DIAGNOSIS — I272 Pulmonary hypertension, unspecified: Secondary | ICD-10-CM

## 2016-10-07 DIAGNOSIS — I480 Paroxysmal atrial fibrillation: Secondary | ICD-10-CM

## 2016-10-07 DIAGNOSIS — I5032 Chronic diastolic (congestive) heart failure: Secondary | ICD-10-CM

## 2016-10-07 DIAGNOSIS — I10 Essential (primary) hypertension: Secondary | ICD-10-CM

## 2016-10-07 NOTE — Patient Instructions (Signed)
Your physician recommends that you schedule a follow-up appointment in: 6 MONTHS WITH DR CRENSHAW  If you need a refill on your cardiac medications before your next appointment, please call your pharmacy.   

## 2016-11-21 IMAGING — DX DG CHEST 2V
2 series · 2 of 2 positions shown · non-contrast
Comparison: 09/04/2015

CLINICAL DATA: Patient is having chest pain in center and left lung
region; SOB. Patient says she started having breathing problems
after taking specialty medication for her hypertension (she does not
know the name of the drug).

EXAM:
CHEST  2 VIEW

[chest pa]
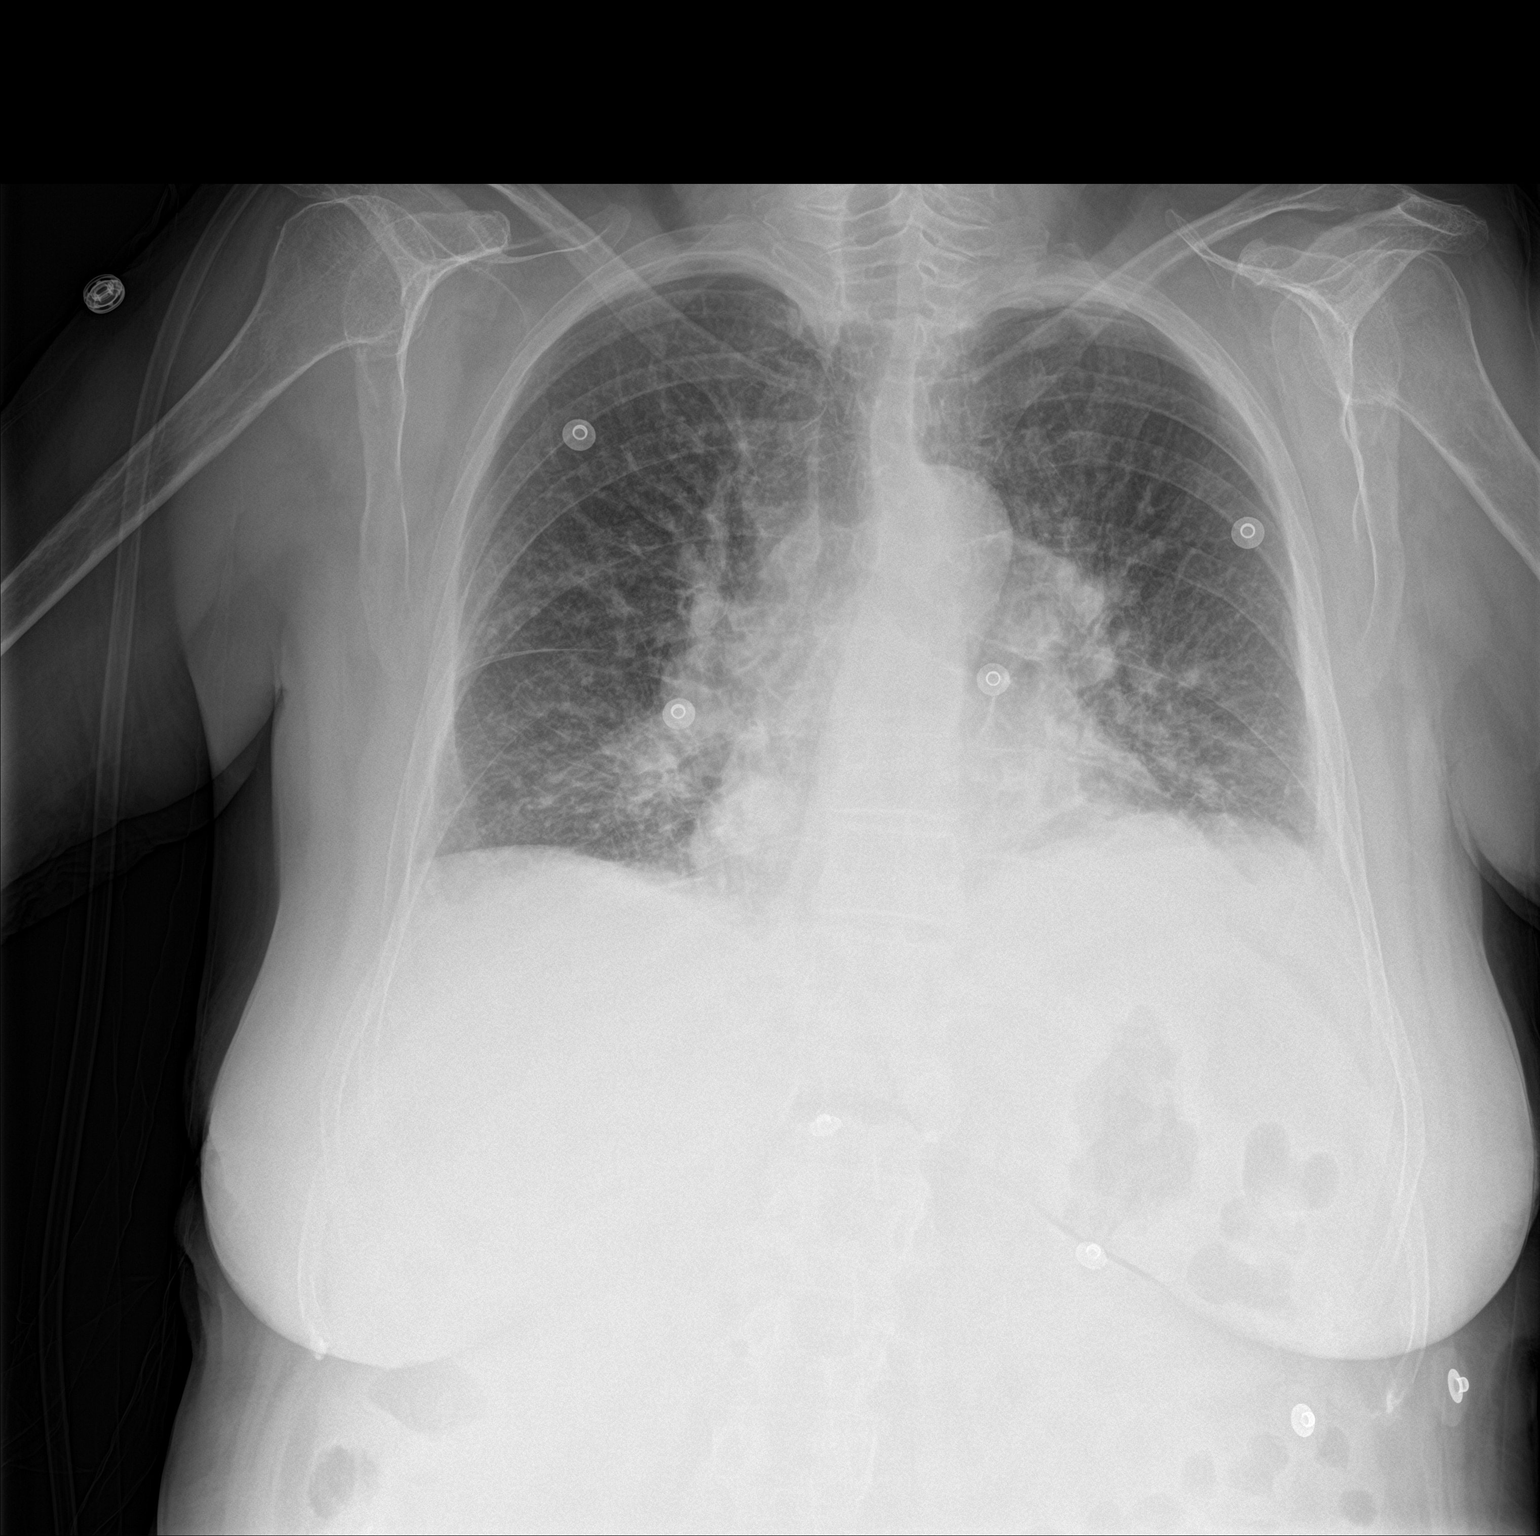

[chest lat]
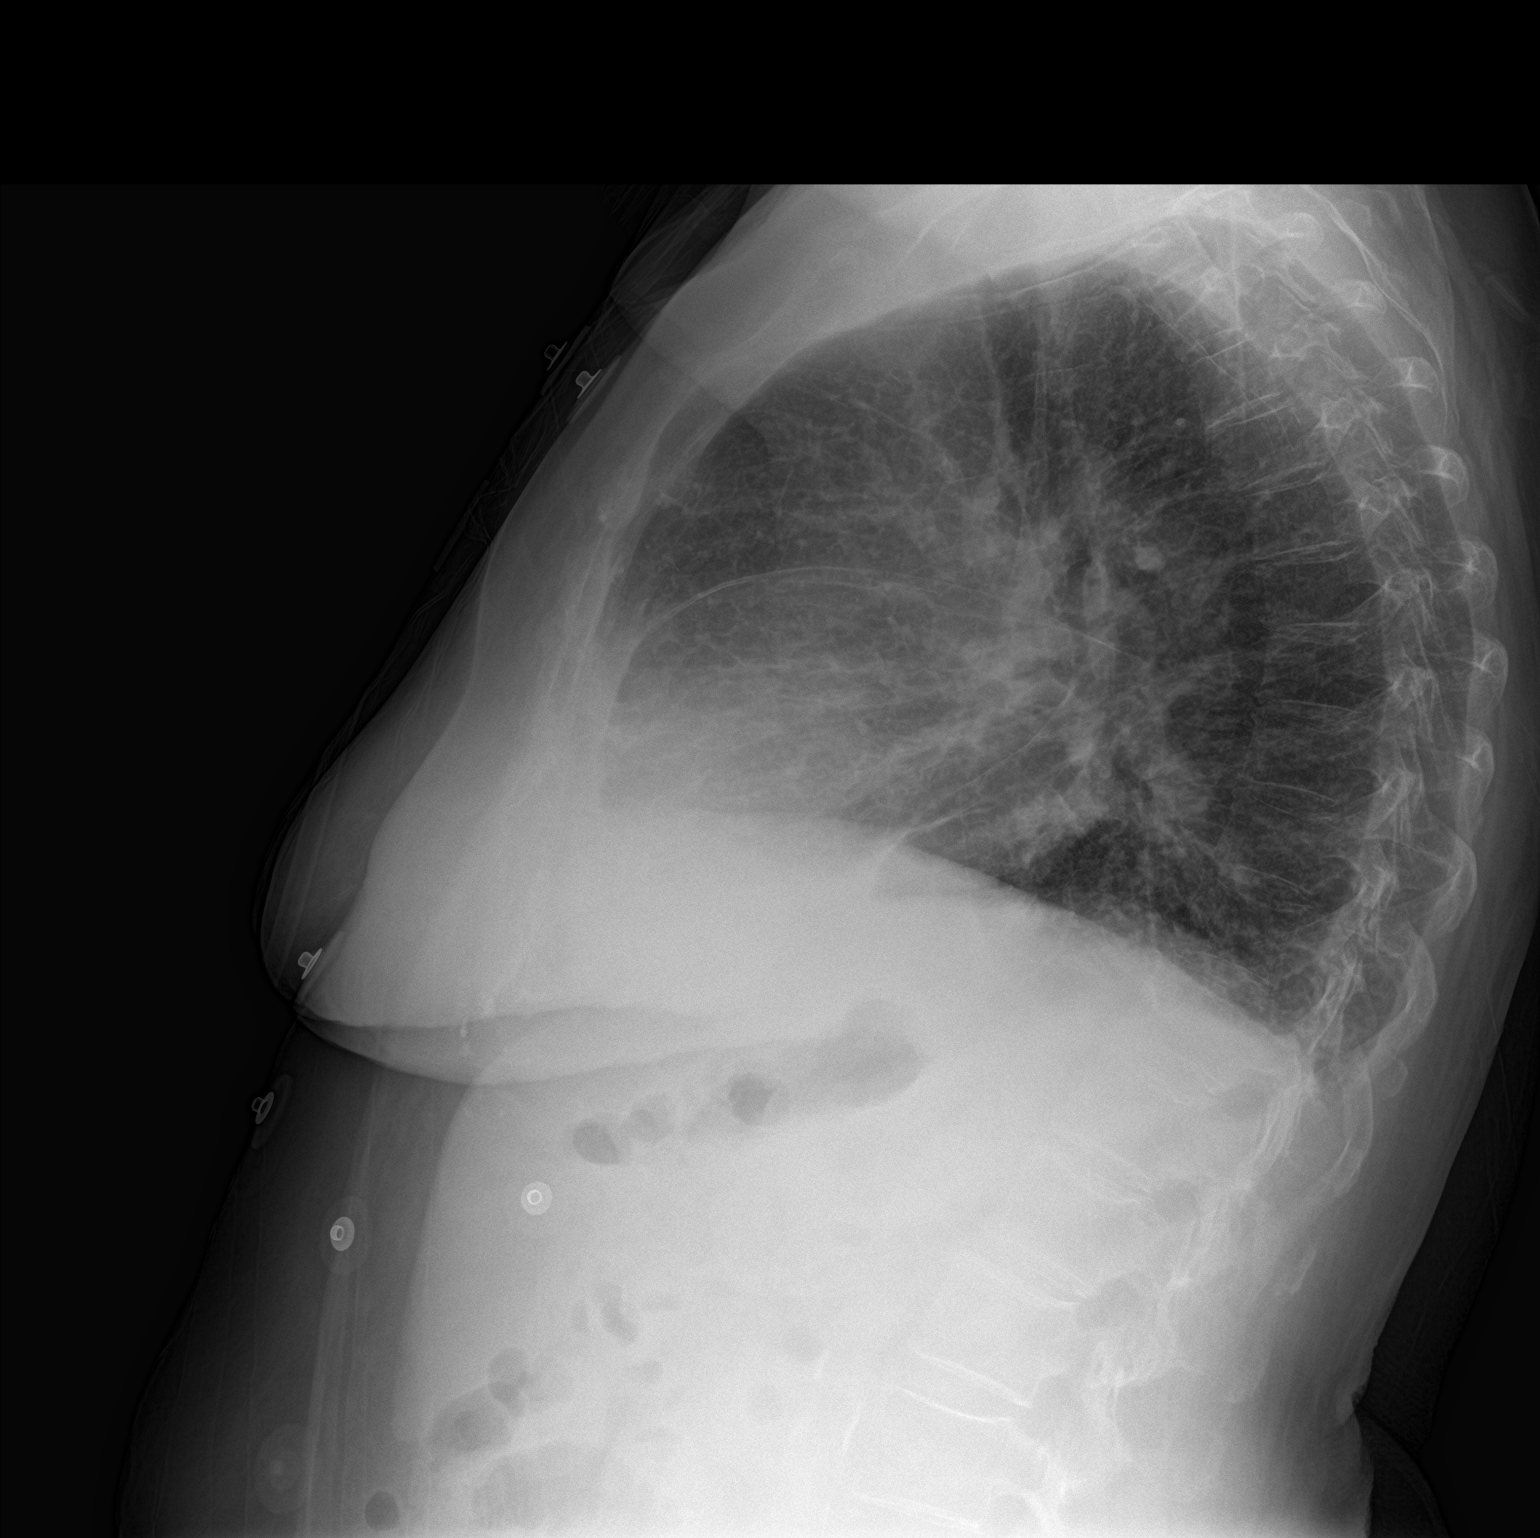

[2 of 2 positions shown; findings below may reference images not displayed]

FINDINGS: There is new irregular interstitial thickening most evident in the
central and lower lungs. No focal lung consolidation.

No pleural effusion or pneumothorax.

Cardiac silhouette is top-normal in size. There is central vascular
congestion. No convincing mediastinal or hilar masses.

Skeletal structures are demineralized but grossly intact.
IMPRESSION: 1. New bilateral irregular interstitial thickening. Findings may
reflect interstitial inflammation related to the new medication.
Interstitial pulmonary edema is possible as is diffuse interstitial
infection.

## 2016-11-23 IMAGING — CR DG CHEST 1V
1 series · 1 of 1 positions shown · non-contrast
Comparison: CT chest 01/18/2016

CLINICAL DATA: Pleural effusion.

EXAM:
CHEST 1 VIEW

[chest pa]
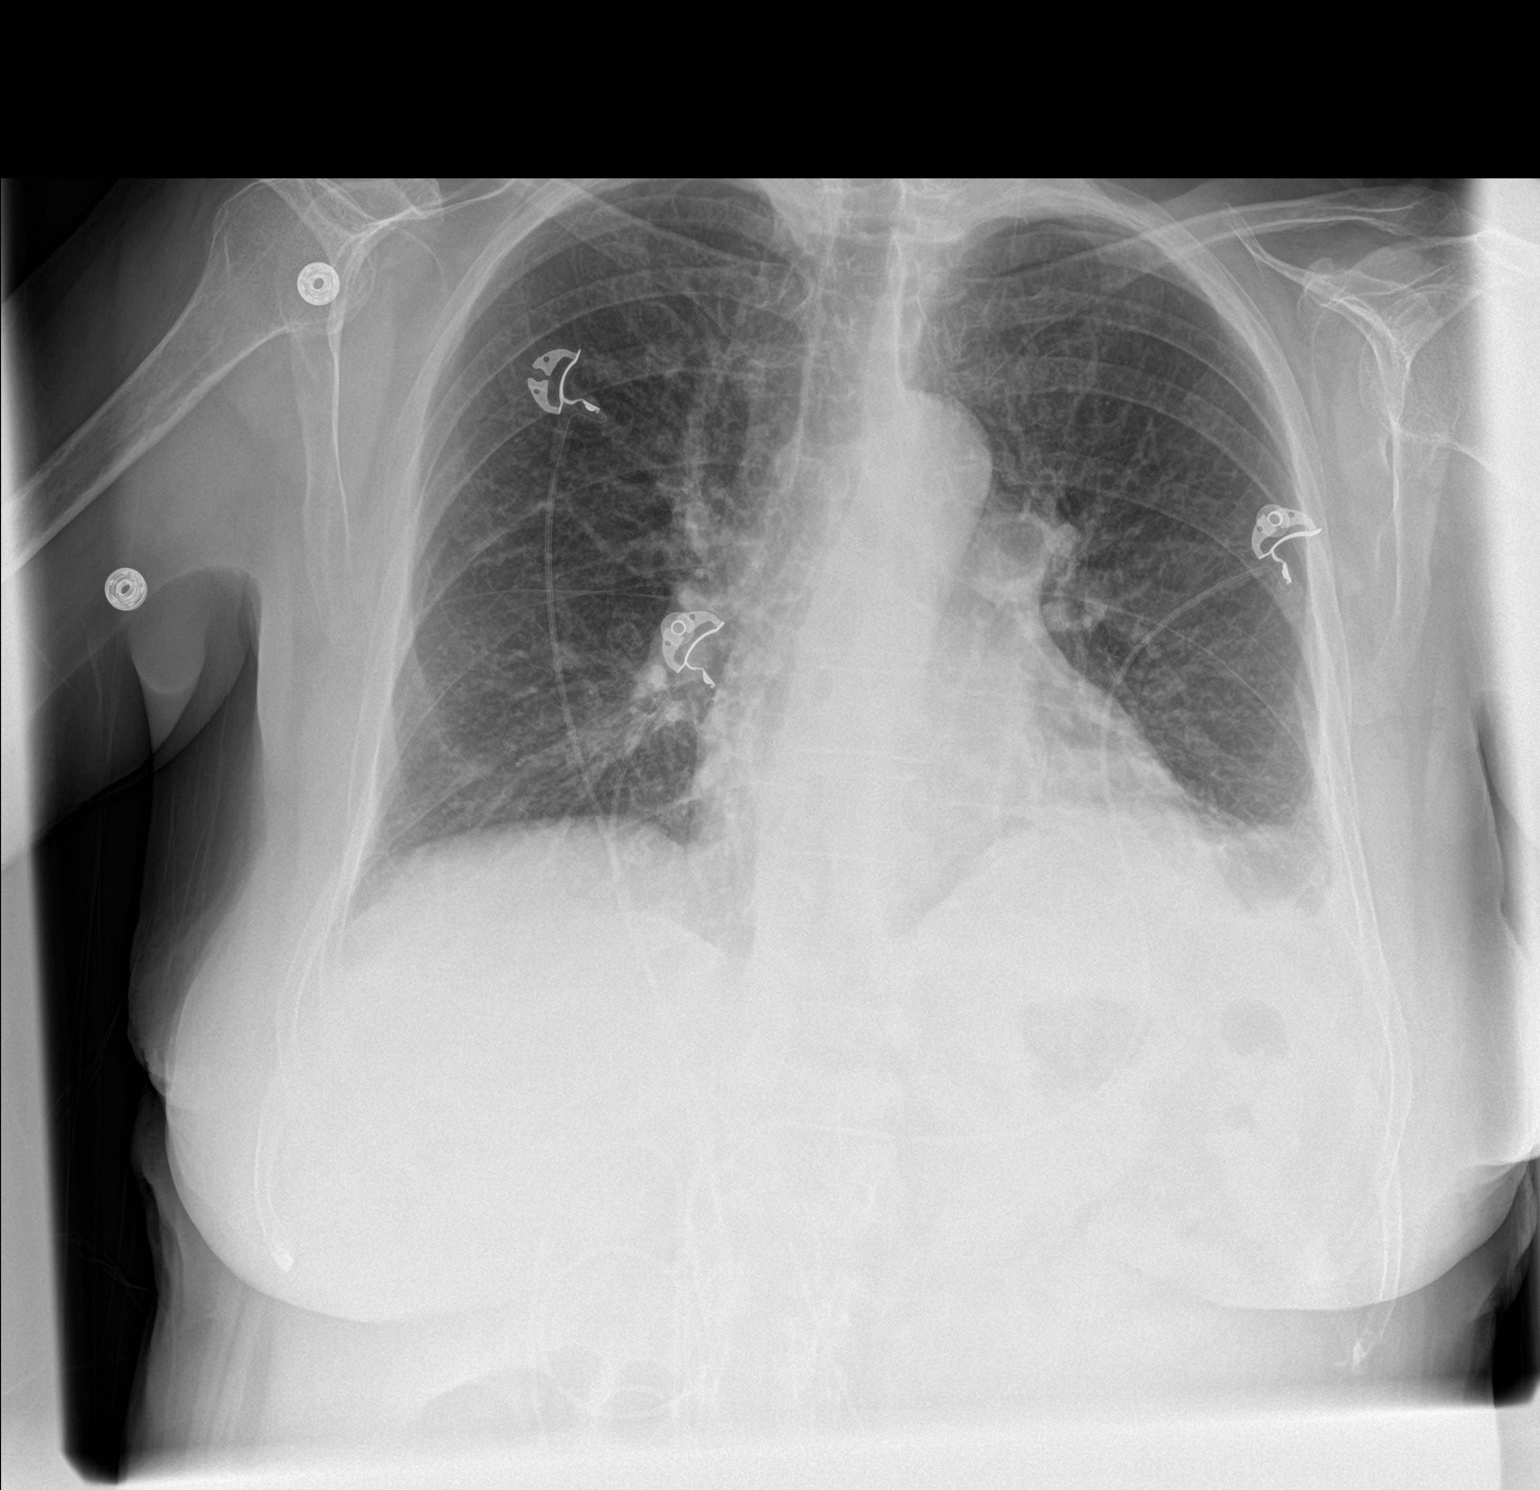

[1 of 1 positions shown; findings below may reference images not displayed]

FINDINGS: The heart mediastinal contours are within normal limits. Lung
volumes are low. The left costophrenic angle is blunted, suggesting
a small pleural effusion. There is left basilar atelectasis. No
definite pleural effusion is appreciated on the right on this single
view of the chest. The upper lung fields are clear. Negative for
pneumothorax P Decreased bony mineralization noted.
IMPRESSION: Probable small left pleural effusion visible on this single frontal
view of the chest. Left basilar atelectasis.

## 2017-03-28 NOTE — Progress Notes (Signed)
HPI: FU diastolic CHF. Patient previously had an episode of atrial fibrillation in the setting of urosepsis. High-resolution CT November 2016 did not show interstitial lung disease. There was note of three-vessel coronary artery calcification. CPX March 2017 suggested functional limitation due to circulatory limitation. Findings suggestive of diastolic dysfunction and pulmonary hypertension. Cath 5/17 showed no obstructive CAD, normal LV function and moderate pulmonary HTN with normal LV filling pressures. VQ 5/17 negative. Echo 10/17 showed normal LV function, grade 2 diastolic dysfunction, mild LAE. Chest CT 10/17 showed no PE; pleural effusions and mild edema. [place] 11/17 showed RA 10, PA mean 50 and PCWP 26. Dyspnea now felt related to chronic diastolic CHF. Since last seen, she has some dyspnea on exertion. No orthopnea, PND, pedal edema or chest pain or syncope. She complains of fatigue.  Current Outpatient Medications  Medication Sig Dispense Refill  . aspirin 81 MG tablet Take 81 mg by mouth daily.    . carvedilol (COREG) 25 MG tablet TAKE 1 TABLET TWICE A DAY FOR 90 DAY(S)  1  . glimepiride (AMARYL) 4 MG tablet Take 4 mg by mouth 2 (two) times daily.     . hydrALAZINE (APRESOLINE) 50 MG tablet Take 50 mg by mouth 2 (two) times daily.  3  . losartan (COZAAR) 100 MG tablet Take 0.5 tablets (50 mg total) by mouth daily. 30 tablet 11  . metFORMIN (GLUCOPHAGE) 500 MG tablet Take 500 mg by mouth 2 (two) times daily.  3  . pravastatin (PRAVACHOL) 20 MG tablet Take 20 mg by mouth every evening.     . furosemide (LASIX) 20 MG tablet Take 20 mg by mouth daily as needed for edema.     No current facility-administered medications for this visit.      Past Medical History:  Diagnosis Date  . Acute diastolic CHF (congestive heart failure) ([facility]) 01/21/2016  . B12 deficiency   . Bladder cancer ([facility])   . Closed head injury   . Essential hypertension, benign   . GERD (gastroesophageal  reflux disease)   . Head injury, unspecified   . Memory difficulty    d/t head injury  . Osteoporosis, unspecified   . Paroxysmal atrial fibrillation (HCC)    during septic shock, none since  . Pulmonary hypertension ([place])   . Pure hypercholesterolemia   . Type II or unspecified type diabetes mellitus without mention of complication, uncontrolled   . Unspecified vitamin D deficiency     Past Surgical History:  Procedure Laterality Date  . APPENDECTOMY  1981  . CARDIAC CATHETERIZATION    . CARDIAC CATHETERIZATION N/A 08/13/2015   Procedure: Right/Left Heart Cath and Coronary Angiography;  Surgeon: Peter M Martinique, MD;  Location: [facility];  Service: Cardiovascular;  Laterality: N/A;  . TONSILLECTOMY    . TOTAL ABDOMINAL HYSTERECTOMY  1981    Social History   Socioeconomic History  . Marital status: Married    Spouse name: Not on file  . Number of children: 3  . Years of education: Not on file  . Highest education level: Not on file  Social Needs  . Financial resource strain: Not on file  . Food insecurity - worry: Not on file  . Food insecurity - inability: Not on file  . Transportation needs - medical: Not on file  . Transportation needs - non-medical: Not on file  Occupational History  . Not on file  Tobacco Use  . Smoking status: Never Smoker  .  Smokeless tobacco: Never Used  Substance and Sexual Activity  . Alcohol use: No    Alcohol/week: 0.0 oz  . Drug use: No  . Sexual activity: Not Currently  Other Topics Concern  . Not on file  Social History Narrative   Married 3 sons   Current work status : retired   No illicit drug use   Alcohol use : No alcohol use   Tobacco use: Never smoke   Education: college             Family History  Problem Relation Age of Onset  . Coronary artery disease Mother        MI  . Heart disease Father        CHF  . Diabetes Sister   . Diabetes Sister   . Thyroid disease Sister   . Arthritis Sister   . Diabetes  Sister     ROS: fatigue but no fevers or chills, productive cough, hemoptysis, dysphasia, odynophagia, melena, hematochezia, dysuria, hematuria, rash, seizure activity, orthopnea, PND, pedal edema, claudication. Remaining systems are negative.  Physical Exam: Well-developed well-nourished in no acute distress.  Skin is warm and dry.  HEENT is normal.  Neck is supple.  Chest is clear to auscultation with normal expansion.  Cardiovascular exam is regular rate and rhythm.  Abdominal exam nontender or distended. No masses palpated. Extremities show no edema. neuro grossly intact   A/P  1 chronic diastolic congestive heart failure-patient appears to be euvolemic. Continue present dose of Lasix. Patient instructed on low sodium diet and fluid restriction.  2 hypertension-blood pressure is elevated. Increase hydralazine to 50 mg by mouth 3 times a day and follow.  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 Continue Lasix as needed.  4 history of paroxysmal atrial fibrillation-remote and occurred in the setting of urosepsis.We have not anticoagulated for this isolated event.  5 history of mild coronary atherosclerosis-continue aspirin and statin.  6 patient complains of fatigue-check TSH and hemoglobin.  Kirk Ruths, MD

## 2017-04-07 ENCOUNTER — Ambulatory Visit (INDEPENDENT_AMBULATORY_CARE_PROVIDER_SITE_OTHER): Payer: Medicare Other | Admitting: Cardiology

## 2017-04-07 ENCOUNTER — Encounter: Payer: Self-pay | Admitting: Cardiology

## 2017-04-07 VITALS — BP 154/66 | HR 62 | Ht 64.0 in | Wt 138.0 lb

## 2017-04-07 DIAGNOSIS — I10 Essential (primary) hypertension: Secondary | ICD-10-CM | POA: Diagnosis not present

## 2017-04-07 DIAGNOSIS — I480 Paroxysmal atrial fibrillation: Secondary | ICD-10-CM | POA: Diagnosis not present

## 2017-04-07 DIAGNOSIS — I5032 Chronic diastolic (congestive) heart failure: Secondary | ICD-10-CM | POA: Diagnosis not present

## 2017-04-07 MED ORDER — HYDRALAZINE HCL 50 MG PO TABS: 50.0000 mg | ORAL_TABLET | Freq: Three times a day (TID) | ORAL | 3 refills | Status: DC

## 2017-04-07 NOTE — Patient Instructions (Signed)
Medication Instructions:   INCREASE HYDRALAZINE TO 50 MG THREE TIMES DAILY  Labwork:  Your physician recommends that you HAVE LAB WORK TODAY  Follow-Up:  Your physician recommends that you schedule a follow-up appointment in: [place]   If you need a refill on your cardiac medications before your next appointment, please call your pharmacy.

## 2017-04-08 LAB — CBC
HEMOGLOBIN: 11.6 g/dL (ref 11.1–15.9)
Hematocrit: 36.7 % (ref 34.0–46.6)
MCH: 28 pg (ref 26.6–33.0)
MCHC: 31.6 g/dL (ref 31.5–35.7)
MCV: 89 fL (ref 79–97)
PLATELETS: 261 10*3/uL (ref 150–379)
RBC: 4.14 x10E6/uL (ref 3.77–5.28)
RDW: 15.3 % (ref 12.3–15.4)
WBC: 7.6 10*3/uL (ref 3.4–10.8)

## 2017-04-08 LAB — TSH: TSH: 1.64 u[IU]/mL (ref 0.450–4.500)

## 2017-09-28 NOTE — Progress Notes (Signed)
HPI: FU diastolic CHF. Patient previously had an episode of atrial fibrillation in the setting of urosepsis. High-resolution CT November 2016 did not show interstitial lung disease. There was note of three-vessel coronary artery calcification. CPX March 2017 suggested functional limitation due to circulatory limitation. Findings suggestive of diastolic dysfunction and pulmonary hypertension. Cath 5/17 showed no obstructive CAD, normal LV function and moderate pulmonary HTN with normal LV filling pressures. VQ 5/17 negative. Echo 10/17 showed normal LV function, grade 2 diastolic dysfunction, mild LAE. Chest CT 10/17 showed no PE; pleural effusions and mild edema. [address] 11/17 showed RA 10, PA mean 50 and PCWP 26. Dyspnea now felt related to chronic diastolic CHF. Since last seen,the patient has dyspnea with more extreme activities but not with routine activities. It is relieved with rest. It is not associated with chest pain. There is no orthopnea, PND or pedal edema. There is no syncope or palpitations. There is no exertional chest pain.   Current Outpatient Medications  Medication Sig Dispense Refill  . aspirin 81 MG tablet Take 81 mg by mouth daily.    . carvedilol (COREG) 25 MG tablet TAKE 1 TABLET TWICE A DAY FOR 90 DAY(S)  1  . furosemide (LASIX) 20 MG tablet Take 20 mg by mouth daily as needed for edema.    Marland Kitchen glimepiride (AMARYL) 4 MG tablet Take 4 mg by mouth 2 (two) times daily.     . hydrALAZINE (APRESOLINE) 50 MG tablet Take 1 tablet (50 mg total) by mouth 3 (three) times daily. 270 tablet 3  . losartan (COZAAR) 100 MG tablet Take 0.5 tablets (50 mg total) by mouth daily. 30 tablet 11  . metFORMIN (GLUCOPHAGE) 500 MG tablet Take 500 mg by mouth 2 (two) times daily.  3  . pravastatin (PRAVACHOL) 20 MG tablet Take 20 mg by mouth every evening.      No current facility-administered medications for this visit.      Past Medical History:  Diagnosis Date  . Acute diastolic CHF  (congestive heart failure) ([facility]) 01/21/2016  . B12 deficiency   . Bladder cancer ([facility])   . Closed head injury   . Essential hypertension, benign   . GERD (gastroesophageal reflux disease)   . Head injury, unspecified   . Memory difficulty    d/t head injury  . Osteoporosis, unspecified   . Paroxysmal atrial fibrillation (HCC)    during septic shock, none since  . Pulmonary hypertension ([place])   . Pure hypercholesterolemia   . Type II or unspecified type diabetes mellitus without mention of complication, uncontrolled   . Unspecified vitamin D deficiency     Past Surgical History:  Procedure Laterality Date  . APPENDECTOMY  1981  . CARDIAC CATHETERIZATION    . CARDIAC CATHETERIZATION N/A 08/13/2015   Procedure: Right/Left Heart Cath and Coronary Angiography;  Surgeon: Peter M Martinique, MD;  Location: [facility];  Service: Cardiovascular;  Laterality: N/A;  . TONSILLECTOMY    . TOTAL ABDOMINAL HYSTERECTOMY  1981    Social History   Socioeconomic History  . Marital status: Married    Spouse name: Not on file  . Number of children: 3  . Years of education: Not on file  . Highest education level: Not on file  Occupational History  . Not on file  Social Needs  . Financial resource strain: Not on file  . Food insecurity:    Worry: Not on file    Inability: Not on file  .  Transportation needs:    Medical: Not on file    Non-medical: Not on file  Tobacco Use  . Smoking status: Never Smoker  . Smokeless tobacco: Never Used  Substance and Sexual Activity  . Alcohol use: No    Alcohol/week: 0.0 oz  . Drug use: No  . Sexual activity: Not Currently  Lifestyle  . Physical activity:    Days per week: Not on file    Minutes per session: Not on file  . Stress: Not on file  Relationships  . Social connections:    Talks on phone: Not on file    Gets together: Not on file    Attends religious service: Not on file    Active member of club or organization: Not on file     Attends meetings of clubs or organizations: Not on file    Relationship status: Not on file  . Intimate partner violence:    Fear of current or ex partner: Not on file    Emotionally abused: Not on file    Physically abused: Not on file    Forced sexual activity: Not on file  Other Topics Concern  . Not on file  Social History Narrative   Married 3 sons   Current work status : retired   No illicit drug use   Alcohol use : No alcohol use   Tobacco use: Never smoke   Education: college             Family History  Problem Relation Age of Onset  . Coronary artery disease Mother        MI  . Heart disease Father        CHF  . Diabetes Sister   . Diabetes Sister   . Thyroid disease Sister   . Arthritis Sister   . Diabetes Sister     ROS: no fevers or chills, productive cough, hemoptysis, dysphasia, odynophagia, melena, hematochezia, dysuria, hematuria, rash, seizure activity, orthopnea, PND, pedal edema, claudication. Remaining systems are negative.  Physical Exam: Well-developed well-nourished in no acute distress.  Skin is warm and dry.  HEENT is normal.  Neck is supple.  Chest is clear to auscultation with normal expansion.  Cardiovascular exam is regular rate and rhythm.  Abdominal exam nontender or distended. No masses palpated. Extremities show no edema. neuro grossly intact  ECG-sinus rhythm at a rate of 60.  Nonspecific ST changes.  Personally reviewed  A/P  1 chronic diastolic congestive heart failure-patient appears to be doing well from a volume standpoint.  We will continue present dose of Lasix.  We discussed fluid restriction and low-sodium diet.    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  We will continue plan as outlined under chronic diastolic congestive heart failure.  3 hypertension-blood pressure is elevated.  However she states typically controlled.  We will continue present medications and follow.  I  would like for her systolic blood pressure to be less than 428 and diastolic less than 85.  4 remote history of atrial fibrillation-in the setting of urosepsis.  We have therefore not anticoagulated as this was an isolated event.  5 history of mild coronary atherosclerosis-no chest pain.  Continue aspirin and statin.  Kirk Ruths, MD

## 2017-09-29 ENCOUNTER — Ambulatory Visit (INDEPENDENT_AMBULATORY_CARE_PROVIDER_SITE_OTHER): Payer: Medicare Other | Admitting: Cardiology

## 2017-09-29 ENCOUNTER — Encounter: Payer: Self-pay | Admitting: Cardiology

## 2017-09-29 VITALS — BP 176/74 | HR 60 | Ht 64.0 in | Wt 144.4 lb

## 2017-09-29 DIAGNOSIS — I480 Paroxysmal atrial fibrillation: Secondary | ICD-10-CM

## 2017-09-29 DIAGNOSIS — I10 Essential (primary) hypertension: Secondary | ICD-10-CM | POA: Diagnosis not present

## 2017-09-29 DIAGNOSIS — I5032 Chronic diastolic (congestive) heart failure: Secondary | ICD-10-CM

## 2017-09-29 MED ORDER — FUROSEMIDE 20 MG PO TABS: 20.0000 mg | ORAL_TABLET | Freq: Every day | ORAL | 12 refills | Status: DC | PRN

## 2017-09-29 NOTE — Patient Instructions (Signed)
Your physician recommends that you schedule a follow-up appointment in: 6 MONTHS WITH DR CRENSHAW  If you need a refill on your cardiac medications before your next appointment, please call your pharmacy.   

## 2017-10-20 SURGERY — Surgical Case
Anesthesia: *Unknown

## 2018-03-13 NOTE — Progress Notes (Signed)
LSL:[id number] CHF. Patient previously had an episode of atrial fibrillation in the setting of urosepsis. High-resolution CT November 2016 did not show interstitial lung disease. There was note of three-vessel coronary artery calcification. CPX March 2017 suggested functional limitation due to circulatory limitation. Findings suggestive of diastolic dysfunction and pulmonary hypertension. Cath 5/17 showed no obstructive CAD, normal LV function and moderate pulmonary HTN with normal LV filling pressures. VQ 5/17 negative. Echo 10/17 showed normal LV function, grade 2 diastolic dysfunction, mild LAE. Chest CT 10/17 showed no PE; pleural effusions and mild edema. [place] 11/17 showed RA 10, PA mean 50 and PCWP 26. Dyspnea now felt related to chronic diastolic CHF. Since last seen,she denies dyspnea, chest pain, palpitations or syncope.  No pedal edema.  She is not having to take her Lasix routinely.  Current Outpatient Medications  Medication Sig Dispense Refill  . aspirin 81 MG tablet Take 81 mg by mouth daily.    . carvedilol (COREG) 25 MG tablet TAKE 1 TABLET TWICE A DAY FOR 90 DAY(S)  1  . empagliflozin (JARDIANCE) 10 MG TABS tablet Take 10 mg by mouth daily.    . furosemide (LASIX) 20 MG tablet Take 1 tablet (20 mg total) by mouth daily as needed for edema. 30 tablet 12  . glimepiride (AMARYL) 4 MG tablet Take 4 mg by mouth 2 (two) times daily.     . hydrALAZINE (APRESOLINE) 50 MG tablet Take 1 tablet (50 mg total) by mouth 3 (three) times daily. 270 tablet 3  . losartan (COZAAR) 100 MG tablet Take 0.5 tablets (50 mg total) by mouth daily. 30 tablet 11  . metFORMIN (GLUCOPHAGE) 500 MG tablet Take 500 mg by mouth 2 (two) times daily.  3  . pravastatin (PRAVACHOL) 20 MG tablet Take 20 mg by mouth every evening.      No current facility-administered medications for this visit.      Past Medical History:  Diagnosis Date  . Acute diastolic CHF (congestive heart failure) ([facility])  01/21/2016  . B12 deficiency   . Bladder cancer ([facility] [place])   . Closed head injury   . Essential hypertension, benign   . GERD (gastroesophageal reflux disease)   . Head injury, unspecified   . Memory difficulty    d/t head injury  . Osteoporosis, unspecified   . Paroxysmal atrial fibrillation (H[facility])    during septic shock, none since  . Pulmonary hypertension ([place])   . Pure hypercholesterolemia   . Type II or unspecified type diabetes mellitus without mention of complication, uncontrolled   . Unspecified vitamin D deficiency     Past Surgical History:  Procedure Laterality Date  . APPENDECTOMY  1981  . CARDIAC CATHETERIZATION    . CARDIAC CATHETERIZATION N/A 08/13/2015   Procedure: Right/Left Heart Cath and Coronary Angiography;  Surgeon: Peter M Martinique, MD;  Location: [facility];  Service: Cardiovascular;  Laterality: N/A;  . TONSILLECTOMY    . TOTAL ABDOMINAL HYSTERECTOMY  1981    Social History   Socioeconomic History  . Marital status: Married    Spouse name: Not on file  . Number of children: 3  . Years of education: Not on file  . Highest education level: Not on file  Occupational History  . Not on file  Social Needs  . Financial resource strain: Not on file  . Food insecurity:    Worry: Not on file    Inability: Not on file  . Transportation needs:  Medical: Not on file    Non-medical: Not on file  Tobacco Use  . Smoking status: Never Smoker  . Smokeless tobacco: Never Used  Substance and Sexual Activity  . Alcohol use: No    Alcohol/week: 0.0 standard drinks  . Drug use: No  . Sexual activity: Not Currently  Lifestyle  . Physical activity:    Days per week: Not on file    Minutes per session: Not on file  . Stress: Not on file  Relationships  . Social connections:    Talks on phone: Not on file    Gets together: Not on file    Attends religious service: Not on file    Active member of club or organization: Not on file    Attends meetings of  clubs or organizations: Not on file    Relationship status: Not on file  . Intimate partner violence:    Fear of current or ex partner: Not on file    Emotionally abused: Not on file    Physically abused: Not on file    Forced sexual activity: Not on file  Other Topics Concern  . Not on file  Social History Narrative   Married 3 sons   Current work status : retired   No illicit drug use   Alcohol use : No alcohol use   Tobacco use: Never smoke   Education: college             Family History  Problem Relation Age of Onset  . Coronary artery disease Mother        MI  . Heart disease Father        CHF  . Diabetes Sister   . Diabetes Sister   . Thyroid disease Sister   . Arthritis Sister   . Diabetes Sister     ROS: no fevers or chills, productive cough, hemoptysis, dysphasia, odynophagia, melena, hematochezia, dysuria, hematuria, rash, seizure activity, orthopnea, PND, pedal edema, claudication. Remaining systems are negative.  Physical Exam: Well-developed well-nourished in no acute distress.  Skin is warm and dry.  HEENT is normal.  Neck is supple.  Chest is clear to auscultation with normal expansion.  Cardiovascular exam is regular rate and rhythm.  Abdominal exam nontender or distended. No masses palpated. Extremities show no edema. neuro grossly intact  A/P  1 chronic diastolic congestive heart failure-patient appears to be euvolemic today on examination.  Plan to continue Lasix at present dose.  Continue fluid restriction and low-sodium diet.  Potassium and renal function monitored by primary care.  2 hypertension-patient's blood pressure is controlled.  Continue present medications and follow.  3 pulmonary hypertension-patient's pulmonary hypertension is felt secondary to pulmonary venous hypertension.  Plan to continue present dose of diuretic.  4 remote history of atrial fibrillation-in the setting of urosepsis.  We have not anticoagulated given this was  an isolated event.  5 history of mild coronary atherosclerosis- continue aspirin and statin.  No chest pain.  6 hyperlipidemia-continue statin.  Lipids and liver monitored by primary care.  Kirk Ruths, MD

## 2018-03-27 ENCOUNTER — Ambulatory Visit (INDEPENDENT_AMBULATORY_CARE_PROVIDER_SITE_OTHER): Payer: Medicare Other | Admitting: Cardiology

## 2018-03-27 ENCOUNTER — Encounter: Payer: Self-pay | Admitting: Cardiology

## 2018-03-27 VITALS — BP 140/58 | HR 60 | Ht 64.0 in | Wt 147.0 lb

## 2018-03-27 DIAGNOSIS — I10 Essential (primary) hypertension: Secondary | ICD-10-CM | POA: Diagnosis not present

## 2018-03-27 DIAGNOSIS — E7800 Pure hypercholesterolemia, unspecified: Secondary | ICD-10-CM | POA: Diagnosis not present

## 2018-03-27 DIAGNOSIS — I5032 Chronic diastolic (congestive) heart failure: Secondary | ICD-10-CM

## 2018-03-27 DIAGNOSIS — I2510 Atherosclerotic heart disease of native coronary artery without angina pectoris: Secondary | ICD-10-CM | POA: Diagnosis not present

## 2018-03-27 NOTE — Patient Instructions (Signed)

## 2018-03-31 ENCOUNTER — Telehealth: Payer: Self-pay | Admitting: Cardiology

## 2018-03-31 NOTE — Telephone Encounter (Signed)
Spoke with pt, questions related to diagnosis answered.

## 2018-03-31 NOTE — Telephone Encounter (Signed)
Pt has hypertension and chronic diastolic CHF [place]

## 2018-03-31 NOTE — Telephone Encounter (Signed)
New Message        Patient is calling today to get clarification of HTN or congestion heart failure. Patient states that she was reading her discharge summary and it seems wrong and would like clarification please.

## 2018-05-13 ENCOUNTER — Other Ambulatory Visit: Payer: Self-pay | Admitting: Cardiology

## 2018-05-15 NOTE — Telephone Encounter (Signed)
Rx request sent to pharmacy.  

## 2019-01-22 ENCOUNTER — Encounter: Payer: Self-pay | Admitting: Dietician

## 2019-01-22 ENCOUNTER — Other Ambulatory Visit: Payer: Self-pay

## 2019-01-22 ENCOUNTER — Encounter: Payer: Medicare Other | Attending: Internal Medicine | Admitting: Dietician

## 2019-01-22 VITALS — Ht 64.0 in | Wt 139.1 lb

## 2019-01-22 DIAGNOSIS — E118 Type 2 diabetes mellitus with unspecified complications: Secondary | ICD-10-CM | POA: Insufficient documentation

## 2019-01-22 DIAGNOSIS — N183 Chronic kidney disease, stage 3 unspecified: Secondary | ICD-10-CM | POA: Diagnosis not present

## 2019-01-22 DIAGNOSIS — M810 Age-related osteoporosis without current pathological fracture: Secondary | ICD-10-CM | POA: Insufficient documentation

## 2019-01-22 DIAGNOSIS — I130 Hypertensive heart and chronic kidney disease with heart failure and stage 1 through stage 4 chronic kidney disease, or unspecified chronic kidney disease: Secondary | ICD-10-CM | POA: Insufficient documentation

## 2019-01-22 DIAGNOSIS — E1122 Type 2 diabetes mellitus with diabetic chronic kidney disease: Secondary | ICD-10-CM

## 2019-01-22 DIAGNOSIS — I509 Heart failure, unspecified: Secondary | ICD-10-CM | POA: Insufficient documentation

## 2019-01-22 NOTE — Patient Instructions (Addendum)
   Drink less soda and drink more Crystal Light for better bone health.   Keep eating 3 meals every day.   Include protein food + small amount of carb/ starchy food + a vegetable with each meal.

## 2019-01-22 NOTE — Progress Notes (Addendum)
Medical Nutrition Therapy: Visit start time: 1330  end time: 1440  Assessment:  Diagnosis: Type 2DM, CKD Stage 3 Past medical history: CHF, osteoporosis, HTN Psychosocial issues/ stress concerns: some short term memory loss per patient  Preferred learning method:  . Auditory . Visual . Hands-on   Current weight: 139.1lbs  Height: 5'4" Medications, supplements: reconciled list in medical record  Progress and evaluation:   Patient reports some decreased appetite and weight loss since starting Ozempic; she feels her best weight is 118-120lbs.    She reports limiting starchy foods ie rice, pasta. Does eat potatoes, beans, peas, some bread for sandwiches. Does not like much red meat, sometimes eats ground chuck.    She reports using Mrs. Dash for seasoning, avoids added salt. She does not recall discussing CKD with care provider in the past, but has limited sodium due to CHF.   Patient is avoiding calcium supplementation at this time due to hypercalcemia.   Physical activity: limited due to CHF  Dietary Intake:  Usual eating pattern includes 3 meals and 1 snacks per day. Dining out frequency: 1-2 meals per week.  Breakfast: 8-10am does not like many breakfast foods ie cereal; sometimes pinto beans with onion and tomato; cottage cheese with fruit; yogurt; peanut butter Snack: none Lunch: 1pm lean cuisine frozen meal; pinto beans with onion and tomato; hot dogs (chicken) [employer] brand; Skeet Simmer with sauerkraut and onion; thin crust pepperoni pizza with olives and onion Snack: occ mini candy bar -- plans to stop; no added salt chips small portion, or honey mustard pretzels uses small paper cup Supper: 6-7pm same as lunch, or occasionally chinese broccoli chicken (no rice); likes leafy greens including spinach Snack: none Beverages: coffee in am with creamer; Mt Dew Zero; Diet Dr. Malachi Bonds; Diet Coke; crystal light   Nutrition Care Education: Topics covered: diabetes, HTN/ kidney  disease, osteoporosis Basic nutrition: basic food groups, appropriate nutrient balance, appropriate meal and snack schedule, general nutrition guidelines    Diabetes: appropriate meal and snack schedule, appropriate carb intake and balance, healthy carb choices, role of protein, fat Hypertension/ kidney disease:  importance of controlling BP, identifying high sodium foods, identifying food sources of potassium, magnesium, advised moderate protein portions osteoporosis: limiting sources of phosphorus, especially sodas; including calcium sources ie cheese in moderation due to hypercalcemia.    Nutritional Diagnosis:  [place] 6-2.2 Altered nutrition-related laboratory As related to Type 2 Diabetes, CKD stage 3.  As evidenced by patient with recent HbA1C of 8.8%, and GFR 37.  Intervention:   Instruction as noted above.  Patient has begun reducing carbohydrate intake, intake is likely inconsistent per patient report of diet history  Patient appreciative of menu ideas  Established goals for further improvement in nutrient balance in meals.   Education Materials given:  . Plate Planner with food lists . Sample menus . Snacking handout . Goals/ instructions   Learner/ who was taught:  . Patient   Level of understanding: . Partial understanding (memory); needs review/ practice  Demonstrated degree of understanding via:   Teach back Learning barriers: Short-term memory deficit -- provided written notes and instructions  Willingness to learn/ readiness for change: . Eager, change in progress  Monitoring and Evaluation:  Dietary intake, exercise, BG control, kidney health, and body weight      follow up: 03/12/19 at 1:30pm

## 2019-02-01 DIAGNOSIS — D494 Neoplasm of unspecified behavior of bladder: Secondary | ICD-10-CM | POA: Diagnosis not present

## 2019-03-12 ENCOUNTER — Encounter: Payer: Medicare Other | Attending: Internal Medicine | Admitting: Dietician

## 2019-03-12 ENCOUNTER — Other Ambulatory Visit: Payer: Self-pay

## 2019-03-12 ENCOUNTER — Encounter: Payer: Self-pay | Admitting: Dietician

## 2019-03-12 VITALS — Ht 64.0 in | Wt 145.7 lb

## 2019-03-12 DIAGNOSIS — I509 Heart failure, unspecified: Secondary | ICD-10-CM | POA: Insufficient documentation

## 2019-03-12 DIAGNOSIS — N183 Chronic kidney disease, stage 3 unspecified: Secondary | ICD-10-CM | POA: Diagnosis not present

## 2019-03-12 DIAGNOSIS — E118 Type 2 diabetes mellitus with unspecified complications: Secondary | ICD-10-CM | POA: Insufficient documentation

## 2019-03-12 DIAGNOSIS — I130 Hypertensive heart and chronic kidney disease with heart failure and stage 1 through stage 4 chronic kidney disease, or unspecified chronic kidney disease: Secondary | ICD-10-CM | POA: Insufficient documentation

## 2019-03-12 DIAGNOSIS — M810 Age-related osteoporosis without current pathological fracture: Secondary | ICD-10-CM | POA: Insufficient documentation

## 2019-03-12 DIAGNOSIS — E1122 Type 2 diabetes mellitus with diabetic chronic kidney disease: Secondary | ICD-10-CM

## 2019-03-12 NOTE — Progress Notes (Signed)
Medical Nutrition Therapy: Visit start time: 1320  end time: 1400  Assessment:  Diagnosis: Type 2 diabetes, CKD stage 3 Medical history changes: no changes Psychosocial issues/ stress concerns: memory loss per patient  Current weight: 145.7lbs Height: 5'4" Medications, supplement changes: no changes  Progress and evaluation:  . Patient is eating at regular intervals and is combining 2-3 food groups at each meal for generally balanced eating.  . She has decreased soda intake.    Physical activity: limited due to CHF; patient uses wheelchair   Dietary Intake:  Usual eating pattern includes 3 meals and 1-2 snacks per day. Dining out frequency: 1-2 meals per week.  Breakfast: 5-8am-- cottage cheese and fruit; pinto beans with tomato and onion; yogurt; peanut butter; instant oatmeal variety pack. sometimes coffee with almond flavor creamer int delight Snack: none or honey mustard pretzels Lunch: 1pm -- pinto beans with tom and onion; sauteed/ steamed squash with onion; fried potatoes with onion Snack: none or pretzels, less candy recently Supper: same as lunch; occ out ie olive garden shrimp scampi (lasted for 3 meals) + salad Snack: pretzels, fruit Beverages: coffee; water with lemon; occasionally 1/2-sweet tea  Nutrition Care Education: Topics covered:  Basic nutrition: basic food groups, appropriate nutrient balance, appropriate meal and snack schedule, general nutrition guidelines    Weight control: reviewed progress since previous visit, basic meal planning -- practiced planning meals and writing menus with patient Diabetes: appropriate carb intake and balance   Nutritional Diagnosis:  [place]-2.2 Altered nutrition-related laboratory As related to Type 2 diabetes, CKD Stage 3.  As evidenced by patient with recent HbA1C of 8.8% and GFR 37.  Intervention:  . Instruction and discussion as noted above. Dora Sims patient through meal planning process and wrote menus based on her food  selections.  . She will continue to practice planning meals at home and bring menus to next appointment for evaluation.   Education Materials given:  Marland Kitchen Sample menus, personalized . Goals/ instructions   Learner/ who was taught:  . Patient   Level of understanding: Marland Kitchen Verbalizes/ demonstrates competency  Demonstrated degree of understanding via:   Teach back Learning barriers: Short-term memory deficit; provided written notes and instructions.  Willingness to learn/ readiness for change: . Eager, change in progress   Monitoring and Evaluation:  Dietary intake, exercise, BG control, kidney health, and body weight      follow up: 04/30/19 at 1:30pm

## 2019-03-12 NOTE — Patient Instructions (Signed)
   Practice planning balanced meals by writing menus on sheets provided. Bring to next appointment for review.

## 2019-03-13 ENCOUNTER — Other Ambulatory Visit: Payer: Self-pay | Admitting: Cardiology

## 2019-04-16 ENCOUNTER — Encounter: Payer: Self-pay | Admitting: Dietician

## 2019-04-16 NOTE — Progress Notes (Signed)
Patient cancelled her MNT appointment for 04/30/19; she states she is doing well with meal planning, and her BG control has improved, and she does not need additional RD assistance at this time. Sent notification to referring provider.

## 2019-04-20 ENCOUNTER — Ambulatory Visit: Payer: Medicare Other | Admitting: Dietician

## 2019-04-24 NOTE — Progress Notes (Signed)
HPI: FUdiastolic CHF. Patient previously had an episode of atrial fibrillation in the setting of urosepsis. High-resolution CT November 2016 did not show interstitial lung disease. There was note of three-vessel coronary artery calcification. CPX March 2017 suggested functional limitation due to circulatory limitation. Findings suggestive of diastolic dysfunction and pulmonary hypertension. Cath 5/17 showed no obstructive CAD, normal LV function and moderate pulmonary HTN with normal LV filling pressures. VQ 5/17 negative. Echo 10/17 showed normal LV function, grade 2 diastolic dysfunction, mild LAE. Chest CT 10/17 showed no PE; pleural effusions and mild edema. [place] 11/17 showed RA 10, PA mean 50 and PCWP 26. Dyspnea now felt related to chronic diastolic CHF. Since last seen,she has dyspnea on exertion unchanged.  She denies pedal edema, chest pain or syncope.  Current Outpatient Medications  Medication Sig Dispense Refill  . aspirin 81 MG tablet Take 81 mg by mouth daily.    . carvedilol (COREG) 25 MG tablet TAKE 1 TABLET TWICE A DAY FOR 90 DAY(S)  1  . CONTOUR NEXT TEST test strip USE TO CHECK BLOOD SUGAR EACH MORNING    . diphenhydrAMINE (BENADRYL) 25 mg capsule Take by mouth.    . furosemide (LASIX) 20 MG tablet Take 1 tablet (20 mg total) by mouth daily as needed for edema. 30 tablet 12  . glimepiride (AMARYL) 4 MG tablet Take 4 mg by mouth 2 (two) times daily.     . hydrALAZINE (APRESOLINE) 50 MG tablet TAKE 1 TABLET BY MOUTH THREE TIMES A DAY 270 tablet 0  . losartan (COZAAR) 100 MG tablet Take 0.5 tablets (50 mg total) by mouth daily. 30 tablet 11  . metFORMIN (GLUCOPHAGE) 500 MG tablet Take 500 mg by mouth 2 (two) times daily.  3  . pravastatin (PRAVACHOL) 20 MG tablet Take 20 mg by mouth every evening.     . Semaglutide,0.25 or 0.5MG /DOS, 2 MG/1.5ML SOPN Inject into the skin.     No current facility-administered medications for this visit.     Past Medical History:   Diagnosis Date  . Acute diastolic CHF (congestive heart failure) ([place]) 01/21/2016  . B12 deficiency   . Bladder cancer ([place])   . Closed head injury   . Essential hypertension, benign   . GERD (gastroesophageal reflux disease)   . Head injury, unspecified   . Memory difficulty    d/t head injury  . Osteoporosis, unspecified   . Paroxysmal atrial fibrillation ([facility])    during septic shock, none since  . Pulmonary hypertension ([place])   . Pure hypercholesterolemia   . Type II or unspecified type diabetes mellitus without mention of complication, uncontrolled   . Unspecified vitamin D deficiency     Past Surgical History:  Procedure Laterality Date  . APPENDECTOMY  1981  . CARDIAC CATHETERIZATION    . CARDIAC CATHETERIZATION N/A 08/13/2015   Procedure: Right/Left Heart Cath and Coronary Angiography;  Surgeon: Peter M Martinique, MD;  Location: [facility];  Service: Cardiovascular;  Laterality: N/A;  . TONSILLECTOMY    . TOTAL ABDOMINAL HYSTERECTOMY  1981    Social History   Socioeconomic History  . Marital status: Married    Spouse name: Not on file  . Number of children: 3  . Years of education: Not on file  . Highest education level: Not on file  Occupational History  . Not on file  Tobacco Use  . Smoking status: Never Smoker  . Smokeless tobacco: Never Used  Substance and Sexual  Activity  . Alcohol use: No    Alcohol/week: 0.0 standard drinks  . Drug use: No  . Sexual activity: Not Currently  Other Topics Concern  . Not on file  Social History Narrative   Married 3 sons   Current work status : retired   No illicit drug use   Alcohol use : No alcohol use   Tobacco use: Never smoke   Education: college            Scientist, physiological Strain:   . Difficulty of Paying Living Expenses: Not on file  Food Insecurity:   . Worried About Charity fundraiser in the Last Year: Not on file  . Ran Out of Food in the Last Year: Not on  file  Transportation Needs:   . Lack of Transportation (Medical): Not on file  . Lack of Transportation (Non-Medical): Not on file  Physical Activity:   . Days of Exercise per Week: Not on file  . Minutes of Exercise per Session: Not on file  Stress:   . Feeling of Stress : Not on file  Social Connections:   . Frequency of Communication with Friends and Family: Not on file  . Frequency of Social Gatherings with Friends and Family: Not on file  . Attends Religious Services: Not on file  . Active Member of Clubs or Organizations: Not on file  . Attends Archivist Meetings: Not on file  . Marital Status: Not on file  Intimate Partner Violence:   . Fear of Current or Ex-Partner: Not on file  . Emotionally Abused: Not on file  . Physically Abused: Not on file  . Sexually Abused: Not on file    Family History  Problem Relation Age of Onset  . Coronary artery disease Mother        MI  . Heart disease Father        CHF  . Diabetes Sister   . Diabetes Sister   . Thyroid disease Sister   . Arthritis Sister   . Diabetes Sister     ROS: no fevers or chills, productive cough, hemoptysis, dysphasia, odynophagia, melena, hematochezia, dysuria, hematuria, rash, seizure activity, orthopnea, PND, pedal edema, claudication. Remaining systems are negative.  Physical Exam: Well-developed well-nourished in no acute distress.  Skin is warm and dry.  HEENT is normal.  Neck is supple.  Chest is clear to auscultation with normal expansion.  Cardiovascular exam is regular rate and rhythm.  Abdominal exam nontender or distended. No masses palpated. Extremities show no edema. neuro grossly intact  ECG-sinus rhythm at a rate of 77, nonspecific inferior lateral T wave changes.  Personally reviewed  A/P  1 chronic diastolic congestive heart failure-patient is euvolemic on physical examination today.  She does have dyspnea on exertion unchanged.  Continue Lasix, fluid restriction and  low-sodium diet.  Check potassium, renal function and BNP.  2 hypertension-blood pressure controlled.  Continue present medical regimen.  3 remote history of atrial fibrillation-occurring in the setting of urosepsis.  We have not anticoagulated for this isolated event.  4 hyperlipidemia-continue statin.  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  Continue diuretic.  If BNP elevated we will advance diuretic.  6 history of mild atherosclerosis-continue aspirin and statin.  Kirk Ruths, MD

## 2019-05-01 ENCOUNTER — Encounter (INDEPENDENT_AMBULATORY_CARE_PROVIDER_SITE_OTHER): Payer: Self-pay

## 2019-05-01 ENCOUNTER — Ambulatory Visit (INDEPENDENT_AMBULATORY_CARE_PROVIDER_SITE_OTHER): Payer: Medicare Other | Admitting: Cardiology

## 2019-05-01 ENCOUNTER — Encounter: Payer: Self-pay | Admitting: Cardiology

## 2019-05-01 ENCOUNTER — Other Ambulatory Visit: Payer: Self-pay

## 2019-05-01 VITALS — BP 146/72 | HR 77 | Temp 97.8°F | Ht 64.0 in | Wt 142.0 lb

## 2019-05-01 DIAGNOSIS — I480 Paroxysmal atrial fibrillation: Secondary | ICD-10-CM

## 2019-05-01 DIAGNOSIS — I10 Essential (primary) hypertension: Secondary | ICD-10-CM

## 2019-05-01 DIAGNOSIS — I5032 Chronic diastolic (congestive) heart failure: Secondary | ICD-10-CM

## 2019-05-01 NOTE — Patient Instructions (Signed)
Medication Instructions:  NO CHANGE *If you need a refill on your cardiac medications before your next appointment, please call your pharmacy*  Lab Work: Your physician recommends that you HAVE LAB WORK TODAY If you have labs (blood work) drawn today and your tests are completely normal, you will receive your results only by: . MyChart Message (if you have MyChart) OR . A paper copy in the mail If you have any lab test that is abnormal or we need to change your treatment, we will call you to review the results.  Follow-Up: At [facility], you and your health needs are our priority.  As part of our continuing mission to provide you with exceptional heart care, we have created designated Provider Care Teams.  These Care Teams include your primary Cardiologist (physician) and Advanced Practice Providers (APPs -  Physician Assistants and Nurse Practitioners) who all work together to provide you with the care you need, when you need it.  Your next appointment:   6 month(s)  The format for your next appointment:   Either In Person or Virtual  Provider:   You may see BRIAN CRENSHAW MD or one of the following Advanced Practice Providers on your designated Care Team:    Luke Kilroy, PA-C  Callie Goodrich, PA-C  Jesse Cleaver, FNP    

## 2019-05-02 ENCOUNTER — Telehealth: Payer: Self-pay | Admitting: *Deleted

## 2019-05-02 DIAGNOSIS — R7989 Other specified abnormal findings of blood chemistry: Secondary | ICD-10-CM

## 2019-05-02 LAB — BASIC METABOLIC PANEL
BUN/Creatinine Ratio: 26 (ref 12–28)
BUN: 21 mg/dL (ref 8–27)
CO2: 21 mmol/L (ref 20–29)
Calcium: 10.1 mg/dL (ref 8.7–10.3)
Chloride: 100 mmol/L (ref 96–106)
Creatinine, Ser: 0.8 mg/dL (ref 0.57–1.00)
GFR calc Af Amer: 84 mL/min/{1.73_m2} (ref >59)
GFR calc non Af Amer: 73 mL/min/{1.73_m2} (ref >59)
Glucose: 189 mg/dL — ABNORMAL HIGH (ref 65–99)
Potassium: 4.8 mmol/L (ref 3.5–5.2)
Sodium: 138 mmol/L (ref 134–144)

## 2019-05-02 LAB — PRO B NATRIURETIC PEPTIDE: NT-Pro BNP: 1479 pg/mL — ABNORMAL HIGH (ref 0–301)

## 2019-05-02 MED ORDER — FUROSEMIDE 20 MG PO TABS: 20.0000 mg | ORAL_TABLET | Freq: Every day | ORAL | 3 refills | Status: DC

## 2019-05-02 NOTE — Telephone Encounter (Signed)
-----   Message from Lelon Perla, MD sent at 05/02/2019  7:44 AM EST ----- Take lasix 20 mg daily; bmet one week Kirk Ruths

## 2019-05-02 NOTE — Telephone Encounter (Signed)
Spoke with pt, Aware of dr Jacalyn Lefevre recommendations. She will call when refill needed. Lab orders mailed to the pt

## 2019-05-08 ENCOUNTER — Telehealth: Payer: Self-pay | Admitting: Cardiology

## 2019-05-08 ENCOUNTER — Other Ambulatory Visit: Payer: Self-pay

## 2019-05-08 NOTE — Telephone Encounter (Signed)
Spoke with pt's husband regarding wife's lab work results. Notified of Dr. Jacalyn Lefevre recommendations. And notified that lab order can be released and they can visit a [facility] near them to have blood work drawn. Pt's husband stated that his wife did not start the lasix right away and asked if they could go in the next few days to have the lab work drawn. Stated this was fine. No other questions or concerns at this time.

## 2019-05-08 NOTE — Telephone Encounter (Signed)
Patients husband calling in to get clarification on lab work that was done last week. There was some confusion between him and his wife in regards to labs.

## 2019-05-14 ENCOUNTER — Telehealth: Payer: Self-pay | Admitting: Cardiology

## 2019-05-14 NOTE — Telephone Encounter (Signed)
[facility] is requesting the recent office notes and any recent testing (especially an EKG) faxed to their office: [phone]  Last saw Dr. Stanford Breed on 05/01/19

## 2019-05-14 NOTE — Telephone Encounter (Signed)
[facility] needs to fax a request for records to [phone]. Please don't send to the pool. I need the faxed request from the doctor's office to document in my release in Epic.

## 2019-05-15 ENCOUNTER — Encounter: Payer: Self-pay | Admitting: *Deleted

## 2019-05-15 LAB — BASIC METABOLIC PANEL
BUN/Creatinine Ratio: 18 (ref 12–28)
BUN: 25 mg/dL (ref 8–27)
CO2: 17 mmol/L — ABNORMAL LOW (ref 20–29)
Calcium: 10.4 mg/dL — ABNORMAL HIGH (ref 8.7–10.3)
Chloride: 98 mmol/L (ref 96–106)
Creatinine, Ser: 1.42 mg/dL — ABNORMAL HIGH (ref 0.57–1.00)
GFR calc Af Amer: 42 mL/min/{1.73_m2} — ABNORMAL LOW (ref >59)
GFR calc non Af Amer: 36 mL/min/{1.73_m2} — ABNORMAL LOW (ref >59)
Glucose: 247 mg/dL — ABNORMAL HIGH (ref 65–99)
Potassium: 5 mmol/L (ref 3.5–5.2)
Sodium: 138 mmol/L (ref 134–144)

## 2019-08-10 ENCOUNTER — Other Ambulatory Visit: Payer: Self-pay | Admitting: Cardiology

## 2019-11-06 NOTE — Progress Notes (Signed)
HPI: FUdiastolic CHF. Patient previously had an episode of atrial fibrillation in the setting of urosepsis. High-resolution CT November 2016 did not show interstitial lung disease. There was note of three-vessel coronary artery calcification. CPX March 2017 suggested functional limitation due to circulatory limitation. Findings suggestive of diastolic dysfunction and pulmonary hypertension. Cath 5/17 showed no obstructive CAD, normal LV function and moderate pulmonary HTN with normal LV filling pressures. VQ 5/17 negative. Echo 10/17 showed normal LV function, grade 2 diastolic dysfunction, mild LAE. Chest CT 10/17 showed no PE; pleural effusions and mild edema. [place] 11/17 showed RA 10, PA mean 50 and PCWP 26. Dyspnea now felt related to chronic diastolic CHF. Since last seen,she does have dyspnea on exertion.  No orthopnea, PND, pedal edema, chest pain or syncope.  Current Outpatient Medications  Medication Sig Dispense Refill  . carvedilol (COREG) 25 MG tablet TAKE 1 TABLET TWICE A DAY FOR 90 DAY(S)  1  . CONTOUR NEXT TEST test strip USE TO CHECK BLOOD SUGAR EACH MORNING    . diphenhydrAMINE (BENADRYL) 25 mg capsule Take by mouth.    . furosemide (LASIX) 20 MG tablet Take 1 tablet (20 mg total) by mouth daily. 90 tablet 3  . glimepiride (AMARYL) 4 MG tablet Take 4 mg by mouth 2 (two) times daily.     . hydrALAZINE (APRESOLINE) 50 MG tablet TAKE 1 TABLET BY MOUTH THREE TIMES A DAY 270 tablet 3  . losartan (COZAAR) 100 MG tablet Take 0.5 tablets (50 mg total) by mouth daily. 30 tablet 11  . metFORMIN (GLUCOPHAGE) 500 MG tablet Take 500 mg by mouth 2 (two) times daily.  3  . pravastatin (PRAVACHOL) 20 MG tablet Take 20 mg by mouth every evening.     . Semaglutide,0.25 or 0.5MG /DOS, 2 MG/1.5ML SOPN Inject into the skin.     No current facility-administered medications for this visit.     Past Medical History:  Diagnosis Date  . Acute diastolic CHF (congestive heart failure) ([place])  01/21/2016  . B12 deficiency   . Bladder cancer ([place])   . Closed head injury   . Essential hypertension, benign   . GERD (gastroesophageal reflux disease)   . Head injury, unspecified   . Memory difficulty    d/t head injury  . Osteoporosis, unspecified   . Paroxysmal atrial fibrillation ([facility])    during septic shock, none since  . Pulmonary hypertension ([place])   . Pure hypercholesterolemia   . Type II or unspecified type diabetes mellitus without mention of complication, uncontrolled   . Unspecified vitamin D deficiency     Past Surgical History:  Procedure Laterality Date  . APPENDECTOMY  1981  . CARDIAC CATHETERIZATION    . CARDIAC CATHETERIZATION N/A 08/13/2015   Procedure: Right/Left Heart Cath and Coronary Angiography;  Surgeon: Peter M Martinique, MD;  Location: [facility];  Service: Cardiovascular;  Laterality: N/A;  . TONSILLECTOMY    . TOTAL ABDOMINAL HYSTERECTOMY  1981    Social History   Socioeconomic History  . Marital status: Married    Spouse name: Not on file  . Number of children: 3  . Years of education: Not on file  . Highest education level: Not on file  Occupational History  . Not on file  Tobacco Use  . Smoking status: Never Smoker  . Smokeless tobacco: Never Used  Substance and Sexual Activity  . Alcohol use: No    Alcohol/week: 0.0 standard drinks  . Drug use:  No  . Sexual activity: Not Currently  Other Topics Concern  . Not on file  Social History Narrative   Married 3 sons   Current work status : retired   No illicit drug use   Alcohol use : No alcohol use   Tobacco use: Never smoke   Education: college            Scientist, physiological Strain:   . Difficulty of Paying Living Expenses:   Food Insecurity:   . Worried About Charity fundraiser in the Last Year:   . Arboriculturist in the Last Year:   Transportation Needs:   . Film/video editor (Medical):   Marland Kitchen Lack of Transportation  (Non-Medical):   Physical Activity:   . Days of Exercise per Week:   . Minutes of Exercise per Session:   Stress:   . Feeling of Stress :   Social Connections:   . Frequency of Communication with Friends and Family:   . Frequency of Social Gatherings with Friends and Family:   . Attends Religious Services:   . Active Member of Clubs or Organizations:   . Attends Archivist Meetings:   Marland Kitchen Marital Status:   Intimate Partner Violence:   . Fear of Current or Ex-Partner:   . Emotionally Abused:   Marland Kitchen Physically Abused:   . Sexually Abused:     Family History  Problem Relation Age of Onset  . Coronary artery disease Mother        MI  . Heart disease Father        CHF  . Diabetes Sister   . Diabetes Sister   . Thyroid disease Sister   . Arthritis Sister   . Diabetes Sister     ROS: no fevers or chills, productive cough, hemoptysis, dysphasia, odynophagia, melena, hematochezia, dysuria, hematuria, rash, seizure activity, orthopnea, PND, pedal edema, claudication. Remaining systems are negative.  Physical Exam: Well-developed well-nourished in no acute distress.  Skin is warm and dry.  HEENT is normal.  Neck is supple.  Chest is clear to auscultation with normal expansion.  Cardiovascular exam is regular rate and rhythm.  Abdominal exam nontender or distended. No masses palpated. Extremities show no edema. neuro grossly intact   A/P  1 chronic diastolic congestive heart failure-patient is euvolemic today.  Continue Lasix at present dose.  Check potassium and renal function.  2 history of pulmonary hypertension-this is felt secondary to pulmonary venous hypertension.  Continue diuretic at present dose.  3 hypertension-blood pressure elevated; however she states typically controlled.  Continue present medications and follow.  4 hyperlipidemia-continue statin.  Check lipids and liver.  5 mild atherosclerosis-continue aspirin and statin.  6 remote history of  atrial fibrillation-this occurred in the setting of urosepsis and we have therefore not anticoagulated given this was an isolated event.  Kirk Ruths, MD

## 2019-11-07 ENCOUNTER — Encounter: Payer: Self-pay | Admitting: Cardiology

## 2019-11-07 ENCOUNTER — Ambulatory Visit (INDEPENDENT_AMBULATORY_CARE_PROVIDER_SITE_OTHER): Payer: Medicare Other | Admitting: Cardiology

## 2019-11-07 ENCOUNTER — Other Ambulatory Visit: Payer: Self-pay

## 2019-11-07 VITALS — BP 167/69 | HR 75 | Ht 64.0 in | Wt 135.0 lb

## 2019-11-07 DIAGNOSIS — I480 Paroxysmal atrial fibrillation: Secondary | ICD-10-CM | POA: Diagnosis not present

## 2019-11-07 DIAGNOSIS — I5032 Chronic diastolic (congestive) heart failure: Secondary | ICD-10-CM | POA: Diagnosis not present

## 2019-11-07 DIAGNOSIS — I2510 Atherosclerotic heart disease of native coronary artery without angina pectoris: Secondary | ICD-10-CM

## 2019-11-07 DIAGNOSIS — I10 Essential (primary) hypertension: Secondary | ICD-10-CM

## 2019-11-07 DIAGNOSIS — E7800 Pure hypercholesterolemia, unspecified: Secondary | ICD-10-CM

## 2019-11-07 NOTE — Patient Instructions (Signed)
Medication Instructions:  No Changes *If you need a refill on your cardiac medications before your next appointment, please call your pharmacy*   Lab Work: CMP, Lipids Today If you have labs (blood work) drawn today and your tests are completely normal, you will receive your results only by: Marland Kitchen MyChart Message (if you have MyChart) OR . A paper copy in the mail If you have any lab test that is abnormal or we need to change your treatment, we will call you to review the results.   Testing/Procedures: None Ordered   Follow-Up: At [facility], you and your health needs are our priority.  As part of our continuing mission to provide you with exceptional heart care, we have created designated Provider Care Teams.  These Care Teams include your primary Cardiologist (physician) and Advanced Practice Providers (APPs -  Physician Assistants and Nurse Practitioners) who all work together to provide you with the care you need, when you need it.  We recommend signing up for the patient portal called "MyChart".  Sign up information is provided on this After Visit Summary.  MyChart is used to connect with patients for Virtual Visits (Telemedicine).  Patients are able to view lab/test results, encounter notes, upcoming appointments, etc.  Non-urgent messages can be sent to your provider as well.   To learn more about what you can do with MyChart, go to [email or web address].    Your next appointment:   6 month(s)  The format for your next appointment:   In Person  Provider:   Kirk Ruths, MD   Other Instructions

## 2019-11-08 LAB — COMPREHENSIVE METABOLIC PANEL
ALT: 10 IU/L (ref 0–32)
AST: 12 IU/L (ref 0–40)
Albumin/Globulin Ratio: 2.1 (ref 1.2–2.2)
Albumin: 3.9 g/dL (ref 3.7–4.7)
Alkaline Phosphatase: 40 IU/L — ABNORMAL LOW (ref 48–121)
BUN/Creatinine Ratio: 23 (ref 12–28)
BUN: 22 mg/dL (ref 8–27)
Bilirubin Total: 0.4 mg/dL (ref 0.0–1.2)
CO2: 23 mmol/L (ref 20–29)
Calcium: 10.1 mg/dL (ref 8.7–10.3)
Chloride: 101 mmol/L (ref 96–106)
Creatinine, Ser: 0.95 mg/dL (ref 0.57–1.00)
GFR calc Af Amer: 68 mL/min/{1.73_m2} (ref >59)
GFR calc non Af Amer: 59 mL/min/{1.73_m2} — ABNORMAL LOW (ref >59)
Globulin, Total: 1.9 g/dL (ref 1.5–4.5)
Glucose: 134 mg/dL — ABNORMAL HIGH (ref 65–99)
Potassium: 5.1 mmol/L (ref 3.5–5.2)
Sodium: 141 mmol/L (ref 134–144)
Total Protein: 5.8 g/dL — ABNORMAL LOW (ref 6.0–8.5)

## 2019-11-08 LAB — LIPID PANEL
Chol/HDL Ratio: 2.8 ratio (ref 0.0–4.4)
Cholesterol, Total: 128 mg/dL (ref 100–199)
HDL: 46 mg/dL (ref >39)
LDL Chol Calc (NIH): 63 mg/dL (ref 0–99)
Triglycerides: 104 mg/dL (ref 0–149)
VLDL Cholesterol Cal: 19 mg/dL (ref 5–40)

## 2019-11-09 ENCOUNTER — Encounter: Payer: Self-pay | Admitting: *Deleted

## 2019-11-19 ENCOUNTER — Ambulatory Visit: Payer: Medicare Other | Admitting: Cardiology

## 2020-05-20 NOTE — Progress Notes (Deleted)
HPI: FUdiastolic CHF. Patient previously had an episode of atrial fibrillation in the setting of urosepsis. High-resolution CT November 2016 did not show interstitial lung disease. There was note of three-vessel coronary artery calcification. CPX March 2017 suggested functional limitation due to circulatory limitation. Findings suggestive of diastolic dysfunction and pulmonary hypertension. Cath 5/17 showed no obstructive CAD, normal LV function and moderate pulmonary HTN with normal LV filling pressures. VQ 5/17 negative. Echo 10/17 showed normal LV function, grade 2 diastolic dysfunction, mild LAE. Chest CT 10/17 showed no PE; pleural effusions and mild edema. [place] 11/17 showed RA 10, PA mean 50 and PCWP 26. Dyspnea now felt related to chronic diastolic CHF. Since last seen,  Current Outpatient Medications  Medication Sig Dispense Refill  . carvedilol (COREG) 25 MG tablet TAKE 1 TABLET TWICE A DAY FOR 90 DAY(S)  1  . CONTOUR NEXT TEST test strip USE TO CHECK BLOOD SUGAR EACH MORNING    . diphenhydrAMINE (BENADRYL) 25 mg capsule Take by mouth.    . furosemide (LASIX) 20 MG tablet Take 1 tablet (20 mg total) by mouth daily. 90 tablet 3  . glimepiride (AMARYL) 4 MG tablet Take 4 mg by mouth 2 (two) times daily.     . hydrALAZINE (APRESOLINE) 50 MG tablet TAKE 1 TABLET BY MOUTH THREE TIMES A DAY 270 tablet 3  . losartan (COZAAR) 100 MG tablet Take 0.5 tablets (50 mg total) by mouth daily. 30 tablet 11  . metFORMIN (GLUCOPHAGE) 500 MG tablet Take 500 mg by mouth 2 (two) times daily.  3  . pravastatin (PRAVACHOL) 20 MG tablet Take 20 mg by mouth every evening.     . Semaglutide,0.25 or 0.5MG /DOS, 2 MG/1.5ML SOPN Inject into the skin.     No current facility-administered medications for this visit.     Past Medical History:  Diagnosis Date  . Acute diastolic CHF (congestive heart failure) ([place]) 01/21/2016  . B12 deficiency   . Bladder cancer ([place])   . Closed head injury   .  Essential hypertension, benign   . GERD (gastroesophageal reflux disease)   . Head injury, unspecified   . Memory difficulty    d/t head injury  . Osteoporosis, unspecified   . Paroxysmal atrial fibrillation ([facility])    during septic shock, none since  . Pulmonary hypertension ([place])   . Pure hypercholesterolemia   . Type II or unspecified type diabetes mellitus without mention of complication, uncontrolled   . Unspecified vitamin D deficiency     Past Surgical History:  Procedure Laterality Date  . APPENDECTOMY  1981  . CARDIAC CATHETERIZATION    . CARDIAC CATHETERIZATION N/A 08/13/2015   Procedure: Right/Left Heart Cath and Coronary Angiography;  Surgeon: Peter M Martinique, MD;  Location: [facility];  Service: Cardiovascular;  Laterality: N/A;  . TONSILLECTOMY    . TOTAL ABDOMINAL HYSTERECTOMY  1981    Social History   Socioeconomic History  . Marital status: Married    Spouse name: Not on file  . Number of children: 3  . Years of education: Not on file  . Highest education level: Not on file  Occupational History  . Not on file  Tobacco Use  . Smoking status: Never Smoker  . Smokeless tobacco: Never Used  Substance and Sexual Activity  . Alcohol use: No    Alcohol/week: 0.0 standard drinks  . Drug use: No  . Sexual activity: Not Currently  Other Topics Concern  . Not on  file  Social History Narrative   Married 3 sons   Current work status : retired   No illicit drug use   Alcohol use : No alcohol use   Tobacco use: Never smoke   Education: college            Scientist, physiological Strain: Not on file  Food Insecurity: Not on file  Transportation Needs: Not on file  Physical Activity: Not on file  Stress: Not on file  Social Connections: Not on file  Intimate Partner Violence: Not on file    Family History  Problem Relation Age of Onset  . Coronary artery disease Mother        MI  . Heart disease Father        CHF  .  Diabetes Sister   . Diabetes Sister   . Thyroid disease Sister   . Arthritis Sister   . Diabetes Sister     ROS: no fevers or chills, productive cough, hemoptysis, dysphasia, odynophagia, melena, hematochezia, dysuria, hematuria, rash, seizure activity, orthopnea, PND, pedal edema, claudication. Remaining systems are negative.  Physical Exam: Well-developed well-nourished in no acute distress.  Skin is warm and dry.  HEENT is normal.  Neck is supple.  Chest is clear to auscultation with normal expansion.  Cardiovascular exam is regular rate and rhythm.  Abdominal exam nontender or distended. No masses palpated. Extremities show no edema. neuro grossly intact  ECG- personally reviewed  A/P  1 chronic diastolic congestive heart failure-patient's volume status appears to be doing reasonably well.  Continue Lasix.  Check potassium and renal function.  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  Continue diuretics as outlined.  3 hypertension-patient's blood pressure is controlled.  Continue present medications.  4 hyperlipidemia-continue statin.  5 history of mild atherosclerosis-continue aspirin and statin.  6 remote history of atrial fibrillation occurring in the setting of urosepsis-this was felt to be an isolated event and we have therefore not anticoagulated long-term.  Kirk Ruths, MD

## 2020-05-27 ENCOUNTER — Ambulatory Visit: Payer: Medicare Other | Admitting: Cardiology

## 2020-05-27 ENCOUNTER — Telehealth: Payer: Self-pay | Admitting: Cardiology

## 2020-05-27 NOTE — Telephone Encounter (Signed)
    COVID-19 Pre-Screening Questions V2.:  . In the past 7 to 10 days have you had a cough,  shortness of breath, headache, congestion, fever (100 or greater) body aches, chills, sore throat, or sudden loss of taste or sense of smell,? . Have you been around anyone with known Covid 19, or who is waiting for a Covid test result and is symptomatic?  FYI-- Patient's husband states a few days ago the patient was exposed to someone who tested positive for COVID on yesterday, 05/26/20. Per patient's husband, the patient is asymptomatic. Patient cancelled 05/27/20 appointment and rescheduled for 08/08/20 with Dr. Stanford Breed.  For patients who are Covid+, pending results or exposed in the last 5 days WITH SYMPTOMS:  1.       Offer to change appointment to a [facility] appointment. If the patient declines, contact covering nurse or triage so it can be determined if patient needs to be seen or     rescheduled. (assumes patient calls ahead)  2.       If a patient presents in the lobby, ask them to return to their car and the nurse will call them. Obtain the correct cell phone number and message the covering nurse. The nurse will triage the patient and discuss with the provider to determine appropriate visit plan (In office, MyChart or reschedule).   3.       If it is determined the patient needs to be seen in the office, arrangements will be made for the patient to be seen in a remote room with minimal touches. (Location will be         specific to each [facility] site).               If you have any concerns/questions about symptoms patients report during screening (either on the phone or at threshold),                Send a secure chat with the patient's chart to the APP doing preop clearances for that day.              If the decision is made to see the patient, document the following in the appt. note:  "cleared by APP's initials".                        If an APP is not available contact a member of  the leadership team.   Patients who are Covid+ are allowed in the office when the following are met: 1. No symptoms or improving symptoms  2. At least 5 days post positive test

## 2020-07-31 NOTE — Progress Notes (Signed)
HPI: FUdiastolic CHF. Patient previously had an episode of atrial fibrillation in the setting of urosepsis. High-resolution CT November 2016 did not show interstitial lung disease. There was note of three-vessel coronary artery calcification. CPX March 2017 suggested functional limitation due to circulatory limitation. Findings suggestive of diastolic dysfunction and pulmonary hypertension. Cath 5/17 showed no obstructive CAD, normal LV function and moderate pulmonary HTN with normal LV filling pressures. VQ 5/17 negative. Echo 10/17 showed normal LV function, grade 2 diastolic dysfunction, mild LAE. Chest CT 10/17 showed no PE; pleural effusions and mild edema. [place] 11/17 showed RA 10, PA mean 50 and PCWP 26. Dyspnea now felt related to chronic diastolic CHF. Since last seen,she continues with dyspnea on exertion.  Minimal orthopnea.  No pedal edema, chest pain or syncope.  Current Outpatient Medications  Medication Sig Dispense Refill  . carvedilol (COREG) 25 MG tablet TAKE 1 TABLET TWICE A DAY FOR 90 DAY(S)  1  . cephALEXin (KEFLEX) 500 MG capsule Take 500 mg by mouth daily as needed.    . CONTOUR NEXT TEST test strip USE TO CHECK BLOOD SUGAR EACH MORNING    . diphenhydrAMINE (BENADRYL) 25 mg capsule Take by mouth.    . furosemide (LASIX) 20 MG tablet Take 1 tablet (20 mg total) by mouth daily. 90 tablet 3  . glimepiride (AMARYL) 4 MG tablet Take 4 mg by mouth 2 (two) times daily.    . hydrALAZINE (APRESOLINE) 50 MG tablet TAKE 1 TABLET BY MOUTH THREE TIMES A DAY 270 tablet 3  . losartan (COZAAR) 100 MG tablet Take 0.5 tablets (50 mg total) by mouth daily. 30 tablet 11  . metFORMIN (GLUCOPHAGE) 500 MG tablet Take 500 mg by mouth 2 (two) times daily.  3  . pravastatin (PRAVACHOL) 20 MG tablet Take 20 mg by mouth every evening.     . Semaglutide,0.25 or 0.5MG /DOS, 2 MG/1.5ML SOPN Inject into the skin.     No current facility-administered medications for this visit.     Past  Medical History:  Diagnosis Date  . Acute diastolic CHF (congestive heart failure) ([place]) 01/21/2016  . B12 deficiency   . Bladder cancer ([place])   . Closed head injury   . Essential hypertension, benign   . GERD (gastroesophageal reflux disease)   . Head injury, unspecified   . Memory difficulty    d/t head injury  . Osteoporosis, unspecified   . Paroxysmal atrial fibrillation ([facility])    during septic shock, none since  . Pulmonary hypertension ([place])   . Pure hypercholesterolemia   . Type II or unspecified type diabetes mellitus without mention of complication, uncontrolled   . Unspecified vitamin D deficiency     Past Surgical History:  Procedure Laterality Date  . APPENDECTOMY  1981  . CARDIAC CATHETERIZATION    . CARDIAC CATHETERIZATION N/A 08/13/2015   Procedure: Right/Left Heart Cath and Coronary Angiography;  Surgeon: Peter M Martinique, MD;  Location: [facility];  Service: Cardiovascular;  Laterality: N/A;  . TONSILLECTOMY    . TOTAL ABDOMINAL HYSTERECTOMY  1981    Social History   Socioeconomic History  . Marital status: Married    Spouse name: Not on file  . Number of children: 3  . Years of education: Not on file  . Highest education level: Not on file  Occupational History  . Not on file  Tobacco Use  . Smoking status: Never Smoker  . Smokeless tobacco: Never Used  Substance and Sexual  Activity  . Alcohol use: No    Alcohol/week: 0.0 standard drinks  . Drug use: No  . Sexual activity: Not Currently  Other Topics Concern  . Not on file  Social History Narrative   Married 3 sons   Current work status : retired   No illicit drug use   Alcohol use : No alcohol use   Tobacco use: Never smoke   Education: college            Scientist, physiological Strain: Not on file  Food Insecurity: Not on file  Transportation Needs: Not on file  Physical Activity: Not on file  Stress: Not on file  Social Connections: Not on file   Intimate Partner Violence: Not on file    Family History  Problem Relation Age of Onset  . Coronary artery disease Mother        MI  . Heart disease Father        CHF  . Diabetes Sister   . Diabetes Sister   . Thyroid disease Sister   . Arthritis Sister   . Diabetes Sister     ROS: no fevers or chills, productive cough, hemoptysis, dysphasia, odynophagia, melena, hematochezia, dysuria, hematuria, rash, seizure activity, orthopnea, PND, pedal edema, claudication. Remaining systems are negative.  Physical Exam: Well-developed well-nourished in no acute distress.  Skin is warm and dry.  HEENT is normal.  Neck is supple.  Chest is clear to auscultation with normal expansion.  Cardiovascular exam is regular rate and rhythm.  Abdominal exam nontender or distended. No masses palpated. Extremities show no edema. neuro grossly intact  ECG-normal sinus rhythm at a rate of 70, left ventricular hypertrophy with repolarization abnormality.  Personally reviewed  A/P  1 chronic diastolic congestive heart failure-patient is euvolemic.  Continue fluid restriction and low-sodium diet.  She does not want to take a diuretic due to bladder issues and frequency.  She does not appear to be volume overloaded on examination.  Check potassium and renal function.  2 hypertension-patient's blood pressure is elevated; however she states typically controlled.  Have asked her to follow this and we will advance medications if needed.  3 hyperlipidemia-continue statin.  Check lipids and liver.  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  Continue diuretic at present dose.  5 aortic atherosclerosis-continue aspirin and statin.  6 remote history of atrial fibrillation-occurring in the setting of urosepsis.  This was an isolated event and we have therefore not anticoagulated.  Kirk Ruths, MD

## 2020-08-08 ENCOUNTER — Other Ambulatory Visit: Payer: Self-pay

## 2020-08-08 ENCOUNTER — Telehealth: Payer: Self-pay

## 2020-08-08 ENCOUNTER — Encounter: Payer: Self-pay | Admitting: Cardiology

## 2020-08-08 ENCOUNTER — Ambulatory Visit (INDEPENDENT_AMBULATORY_CARE_PROVIDER_SITE_OTHER): Payer: Medicare Other | Admitting: Cardiology

## 2020-08-08 VITALS — BP 182/76 | HR 70 | Ht 64.5 in | Wt 138.8 lb

## 2020-08-08 DIAGNOSIS — I2510 Atherosclerotic heart disease of native coronary artery without angina pectoris: Secondary | ICD-10-CM | POA: Diagnosis not present

## 2020-08-08 DIAGNOSIS — E7800 Pure hypercholesterolemia, unspecified: Secondary | ICD-10-CM | POA: Diagnosis not present

## 2020-08-08 DIAGNOSIS — I5032 Chronic diastolic (congestive) heart failure: Secondary | ICD-10-CM

## 2020-08-08 DIAGNOSIS — E875 Hyperkalemia: Secondary | ICD-10-CM

## 2020-08-08 DIAGNOSIS — I480 Paroxysmal atrial fibrillation: Secondary | ICD-10-CM | POA: Diagnosis not present

## 2020-08-08 LAB — COMPREHENSIVE METABOLIC PANEL
ALT: 8 IU/L (ref 0–32)
AST: 11 IU/L (ref 0–40)
Albumin/Globulin Ratio: 1.6 (ref 1.2–2.2)
Albumin: 3.9 g/dL (ref 3.7–4.7)
Alkaline Phosphatase: 43 IU/L — ABNORMAL LOW (ref 44–121)
BUN/Creatinine Ratio: 20 (ref 12–28)
BUN: 25 mg/dL (ref 8–27)
Bilirubin Total: 0.4 mg/dL (ref 0.0–1.2)
CO2: 22 mmol/L (ref 20–29)
Calcium: 10.6 mg/dL — ABNORMAL HIGH (ref 8.7–10.3)
Chloride: 99 mmol/L (ref 96–106)
Creatinine, Ser: 1.24 mg/dL — ABNORMAL HIGH (ref 0.57–1.00)
Globulin, Total: 2.5 g/dL (ref 1.5–4.5)
Glucose: 259 mg/dL — ABNORMAL HIGH (ref 65–99)
Potassium: 5.8 mmol/L (ref 3.5–5.2)
Sodium: 136 mmol/L (ref 134–144)
Total Protein: 6.4 g/dL (ref 6.0–8.5)
eGFR: 45 mL/min/{1.73_m2} — ABNORMAL LOW (ref >59)

## 2020-08-08 LAB — LIPID PANEL
Chol/HDL Ratio: 3.3 ratio (ref 0.0–4.4)
Cholesterol, Total: 165 mg/dL (ref 100–199)
HDL: 50 mg/dL (ref >39)
LDL Chol Calc (NIH): 96 mg/dL (ref 0–99)
Triglycerides: 104 mg/dL (ref 0–149)
VLDL Cholesterol Cal: 19 mg/dL (ref 5–40)

## 2020-08-08 NOTE — Telephone Encounter (Signed)
Received a call from [place] with [employer] to report patient's potassium today 5.8.Message sent to [place].

## 2020-08-08 NOTE — Patient Instructions (Signed)

## 2020-08-08 NOTE — Telephone Encounter (Signed)
Spoke with pt, Aware of dr Jacalyn Lefevre recommendations. She reports eating a lot of tomatoes and bananas. She will watch her diet.

## 2020-08-08 NOTE — Telephone Encounter (Signed)
Low K diet; hold losartan; repeat bmet Monday. Kirk Ruths

## 2020-08-12 ENCOUNTER — Telehealth: Payer: Self-pay | Admitting: *Deleted

## 2020-08-12 LAB — BASIC METABOLIC PANEL
BUN/Creatinine Ratio: 20 (ref 12–28)
BUN: 25 mg/dL (ref 8–27)
CO2: 22 mmol/L (ref 20–29)
Calcium: 9.9 mg/dL (ref 8.7–10.3)
Chloride: 100 mmol/L (ref 96–106)
Creatinine, Ser: 1.28 mg/dL — ABNORMAL HIGH (ref 0.57–1.00)
Glucose: 274 mg/dL — ABNORMAL HIGH (ref 65–99)
Potassium: 5.3 mmol/L — ABNORMAL HIGH (ref 3.5–5.2)
Sodium: 137 mmol/L (ref 134–144)
eGFR: 43 mL/min/{1.73_m2} — ABNORMAL LOW (ref >59)

## 2020-08-12 MED ORDER — HYDRALAZINE HCL 100 MG PO TABS: 100.0000 mg | ORAL_TABLET | Freq: Two times a day (BID) | ORAL | 3 refills | Status: DC

## 2020-08-12 NOTE — Telephone Encounter (Addendum)
Spoke with pt, she reports she can not remember to take the midday dose of hydralazine. She will take 100 mg twice daily. New script sent to the pharmacy    ----- Message from Lelon Perla, MD sent at 08/12/2020  7:18 AM EDT ----- Continue off losartan; increase hydralazine to 100 TID; follow BP Kirk Ruths

## 2021-02-25 NOTE — Progress Notes (Deleted)
HPI: FU diastolic CHF. Patient previously had an episode of atrial fibrillation in the setting of urosepsis. High-resolution CT November 2016 did not show interstitial lung disease. There was note of three-vessel coronary artery calcification. CPX March 2017 suggested functional limitation due to circulatory limitation. Findings suggestive of diastolic dysfunction and pulmonary hypertension. Cath 5/17 showed no obstructive CAD, normal LV function and moderate pulmonary HTN with normal LV filling pressures. VQ 5/17 negative. Echo 10/17 showed normal LV function, grade 2 diastolic dysfunction, mild LAE. Chest CT 10/17 showed no PE; pleural effusions and mild edema. [place] 11/17 showed RA 10, PA mean 50 and PCWP 26. Dyspnea now felt related to chronic diastolic CHF. Since last seen,   Current Outpatient Medications  Medication Sig Dispense Refill   carvedilol (COREG) 25 MG tablet TAKE 1 TABLET TWICE A DAY FOR 90 DAY(S)  1   cephALEXin (KEFLEX) 500 MG capsule Take 500 mg by mouth daily as needed.     CONTOUR NEXT TEST test strip USE TO CHECK BLOOD SUGAR EACH MORNING     diphenhydrAMINE (BENADRYL) 25 mg capsule Take by mouth.     furosemide (LASIX) 20 MG tablet Take 1 tablet (20 mg total) by mouth daily. 90 tablet 3   glimepiride (AMARYL) 4 MG tablet Take 4 mg by mouth 2 (two) times daily.     hydrALAZINE (APRESOLINE) 100 MG tablet Take 1 tablet (100 mg total) by mouth 2 (two) times daily. 180 tablet 3   metFORMIN (GLUCOPHAGE) 500 MG tablet Take 500 mg by mouth 2 (two) times daily.  3   pravastatin (PRAVACHOL) 20 MG tablet Take 20 mg by mouth every evening.      Semaglutide,0.25 or 0.5MG /DOS, 2 MG/1.5ML SOPN Inject into the skin.     No current facility-administered medications for this visit.     Past Medical History:  Diagnosis Date   Acute diastolic CHF (congestive heart failure) ([place]) 01/21/2016   B12 deficiency    Bladder cancer ([facility])    Closed head injury    Essential  hypertension, benign    GERD (gastroesophageal reflux disease)    Head injury, unspecified    Memory difficulty    d/t head injury   Osteoporosis, unspecified    Paroxysmal atrial fibrillation ([place])    during septic shock, none since   Pulmonary hypertension ([place])    Pure hypercholesterolemia    Type II or unspecified type diabetes mellitus without mention of complication, uncontrolled    Unspecified vitamin D deficiency     Past Surgical History:  Procedure Laterality Date   APPENDECTOMY  1981   CARDIAC CATHETERIZATION     CARDIAC CATHETERIZATION N/A 08/13/2015   Procedure: Right/Left Heart Cath and Coronary Angiography;  Surgeon: Peter M Martinique, MD;  Location: [facility];  Service: Cardiovascular;  Laterality: N/A;   TONSILLECTOMY     TOTAL ABDOMINAL HYSTERECTOMY  1981    Social History   Socioeconomic History   Marital status: Married    Spouse name: Not on file   Number of children: 3   Years of education: Not on file   Highest education level: Not on file  Occupational History   Not on file  Tobacco Use   Smoking status: Never   Smokeless tobacco: Never  Substance and Sexual Activity   Alcohol use: No    Alcohol/week: 0.0 standard drinks   Drug use: No   Sexual activity: Not Currently  Other Topics Concern   Not on  file  Social History Narrative   Married 3 sons   Current work status : retired   No illicit drug use   Alcohol use : No alcohol use   Tobacco use: Never smoke   Education: college            Scientist, physiological Strain: Not on file  Food Insecurity: Not on file  Transportation Needs: Not on file  Physical Activity: Not on file  Stress: Not on file  Social Connections: Not on file  Intimate Partner Violence: Not on file    Family History  Problem Relation Age of Onset   Coronary artery disease Mother        MI   Heart disease Father        CHF   Diabetes Sister    Diabetes Sister    Thyroid  disease Sister    Arthritis Sister    Diabetes Sister     ROS: no fevers or chills, productive cough, hemoptysis, dysphasia, odynophagia, melena, hematochezia, dysuria, hematuria, rash, seizure activity, orthopnea, PND, pedal edema, claudication. Remaining systems are negative.  Physical Exam: Well-developed well-nourished in no acute distress.  Skin is warm and dry.  HEENT is normal.  Neck is supple.  Chest is clear to auscultation with normal expansion.  Cardiovascular exam is regular rate and rhythm.  Abdominal exam nontender or distended. No masses palpated. Extremities show no edema. neuro grossly intact  ECG- personally reviewed  A/P  1 chronic diastolic congestive heart failure-patient remains euvolemic on examination.  She has previously declined diuretic due to bladder issues and frequency.  We will continue to follow.  Continue fluid restriction and low-sodium diet.  2 hypertension-blood pressure controlled.  Continue present medical regimen.  3 hyperlipidemia-continue statin.  4 history of aortic atherosclerosis-continue statin.  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  As outlined above she remains euvolemic on examination.  Continue diuretic as needed.  6 remote history of atrial fibrillation-previously occurred in the setting of urosepsis and was an isolated event.  We have therefore not anticoagulated long-term.  Kirk Ruths, MD

## 2021-03-09 ENCOUNTER — Ambulatory Visit: Payer: Medicare Other | Admitting: Cardiology

## 2021-04-28 DIAGNOSIS — C678 Malignant neoplasm of overlapping sites of bladder: Secondary | ICD-10-CM | POA: Diagnosis not present

## 2021-04-28 DIAGNOSIS — B9620 Unspecified Escherichia coli [E. coli] as the cause of diseases classified elsewhere: Secondary | ICD-10-CM | POA: Diagnosis not present

## 2021-04-28 DIAGNOSIS — C679 Malignant neoplasm of bladder, unspecified: Secondary | ICD-10-CM | POA: Diagnosis not present

## 2021-04-28 DIAGNOSIS — Z126 Encounter for screening for malignant neoplasm of bladder: Secondary | ICD-10-CM | POA: Diagnosis not present

## 2021-04-28 DIAGNOSIS — N3289 Other specified disorders of bladder: Secondary | ICD-10-CM | POA: Diagnosis not present

## 2021-04-28 DIAGNOSIS — R8290 Unspecified abnormal findings in urine: Secondary | ICD-10-CM | POA: Diagnosis not present

## 2021-04-28 DIAGNOSIS — N390 Urinary tract infection, site not specified: Secondary | ICD-10-CM | POA: Diagnosis not present

## 2021-04-29 NOTE — Progress Notes (Signed)
HPI:FU diastolic CHF. Patient previously had an episode of atrial fibrillation in the setting of urosepsis. High-resolution CT November 2016 did not show interstitial lung disease. There was note of three-vessel coronary artery calcification. CPX March 2017 suggested functional limitation due to circulatory limitation. Findings suggestive of diastolic dysfunction and pulmonary hypertension. Cath 5/17 showed no obstructive CAD, normal LV function and moderate pulmonary HTN with normal LV filling pressures. VQ 5/17 negative. Echo 10/17 showed normal LV function, grade 2 diastolic dysfunction, mild LAE. Chest CT 10/17 showed no PE; pleural effusions and mild edema. [place] 11/17 showed RA 10, PA mean 50 and PCWP 26. Dyspnea now felt related to chronic diastolic CHF. Since last seen, she has dyspnea on exertion unchanged.  She sleeps on a wedge.  She denies pedal edema, chest pain, palpitations or syncope.  Current Outpatient Medications  Medication Sig Dispense Refill   carvedilol (COREG) 25 MG tablet TAKE 1 TABLET TWICE A DAY FOR 90 DAY(S)  1   cephALEXin (KEFLEX) 500 MG capsule Take 500 mg by mouth daily as needed.     CONTOUR NEXT TEST test strip USE TO CHECK BLOOD SUGAR EACH MORNING     diphenhydrAMINE (BENADRYL) 25 mg capsule Take by mouth.     furosemide (LASIX) 20 MG tablet Take 1 tablet (20 mg total) by mouth daily. 90 tablet 3   glimepiride (AMARYL) 4 MG tablet Take 4 mg by mouth 2 (two) times daily.     hydrALAZINE (APRESOLINE) 100 MG tablet Take 1 tablet (100 mg total) by mouth 2 (two) times daily. 180 tablet 3   metFORMIN (GLUCOPHAGE) 500 MG tablet Take 500 mg by mouth 2 (two) times daily.  3   pravastatin (PRAVACHOL) 20 MG tablet Take 20 mg by mouth every evening.      Semaglutide,0.25 or 0.5MG /DOS, 2 MG/1.5ML SOPN Inject into the skin.     No current facility-administered medications for this visit.     Past Medical History:  Diagnosis Date   Acute diastolic CHF (congestive  heart failure) ([place]) 01/21/2016   B12 deficiency    Bladder cancer (HCC)    Closed head injury    Essential hypertension, benign    GERD (gastroesophageal reflux disease)    Head injury, unspecified    Memory difficulty    d/t head injury   Osteoporosis, unspecified    Paroxysmal atrial fibrillation ([place])    during septic shock, none since   Pulmonary hypertension ([place])    Pure hypercholesterolemia    Type II or unspecified type diabetes mellitus without mention of complication, uncontrolled    Unspecified vitamin D deficiency     Past Surgical History:  Procedure Laterality Date   APPENDECTOMY  1981   CARDIAC CATHETERIZATION     CARDIAC CATHETERIZATION N/A 08/13/2015   Procedure: Right/Left Heart Cath and Coronary Angiography;  Surgeon: Peter M Martinique, MD;  Location: [facility];  Service: Cardiovascular;  Laterality: N/A;   TONSILLECTOMY     TOTAL ABDOMINAL HYSTERECTOMY  1981    Social History   Socioeconomic History   Marital status: Married    Spouse name: Not on file   Number of children: 3   Years of education: Not on file   Highest education level: Not on file  Occupational History   Not on file  Tobacco Use   Smoking status: Never   Smokeless tobacco: Never  Substance and Sexual Activity   Alcohol use: No    Alcohol/week: 0.0 standard  drinks   Drug use: No   Sexual activity: Not Currently  Other Topics Concern   Not on file  Social History Narrative   Married 3 sons   Current work status : retired   No illicit drug use   Alcohol use : No alcohol use   Tobacco use: Never smoke   Education: college            Scientist, physiological Strain: Not on file  Food Insecurity: Not on file  Transportation Needs: Not on file  Physical Activity: Not on file  Stress: Not on file  Social Connections: Not on file  Intimate Partner Violence: Not on file    Family History  Problem Relation Age of Onset   Coronary artery  disease Mother        MI   Heart disease Father        CHF   Diabetes Sister    Diabetes Sister    Thyroid disease Sister    Arthritis Sister    Diabetes Sister     ROS: no fevers or chills, productive cough, hemoptysis, dysphasia, odynophagia, melena, hematochezia, dysuria, hematuria, rash, seizure activity, orthopnea, PND, pedal edema, claudication. Remaining systems are negative.  Physical Exam: Well-developed well-nourished in no acute distress.  Skin is warm and dry.  HEENT is normal.  Neck is supple.  Chest is clear to auscultation with normal expansion.  Cardiovascular exam is regular rate and rhythm.  Abdominal exam nontender or distended. No masses palpated. Extremities show no edema. neuro grossly intact  ECG-normal sinus rhythm at a rate of 62, left ventricular hypertrophy, nonspecific ST changes.  Personally reviewed  A/P  1 chronic diastolic congestive heart failure-patient appears to be euvolemic on examination today.  Can continue fluid restriction and low-sodium diet.  She is not taking her diuretic due to issues with frequency.  I have asked her to take it as needed for lower extremity edema or worsening dyspnea.  2 hypertension-blood pressure controlled.  Continue present medications.  3 hyperlipidemia-continue statin.  Lipids and liver monitored by primary care.  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  Continue present management.  5 remote history of atrial fibrillation-this occurred in the setting of urosepsis and was an isolated event.  No documented recurrences.  6 aortic atherosclerosis-continue statin.  Kirk Ruths, MD

## 2021-05-04 DIAGNOSIS — N1831 Chronic kidney disease, stage 3a: Secondary | ICD-10-CM | POA: Diagnosis not present

## 2021-05-04 DIAGNOSIS — E1129 Type 2 diabetes mellitus with other diabetic kidney complication: Secondary | ICD-10-CM | POA: Diagnosis not present

## 2021-05-06 DIAGNOSIS — E7800 Pure hypercholesterolemia, unspecified: Secondary | ICD-10-CM | POA: Diagnosis not present

## 2021-05-06 DIAGNOSIS — Z6823 Body mass index (BMI) 23.0-23.9, adult: Secondary | ICD-10-CM | POA: Diagnosis not present

## 2021-05-06 DIAGNOSIS — M810 Age-related osteoporosis without current pathological fracture: Secondary | ICD-10-CM | POA: Diagnosis not present

## 2021-05-06 DIAGNOSIS — I10 Essential (primary) hypertension: Secondary | ICD-10-CM | POA: Diagnosis not present

## 2021-05-06 DIAGNOSIS — I700 Atherosclerosis of aorta: Secondary | ICD-10-CM | POA: Diagnosis not present

## 2021-05-06 DIAGNOSIS — E1129 Type 2 diabetes mellitus with other diabetic kidney complication: Secondary | ICD-10-CM | POA: Diagnosis not present

## 2021-05-06 DIAGNOSIS — I4891 Unspecified atrial fibrillation: Secondary | ICD-10-CM | POA: Diagnosis not present

## 2021-05-06 DIAGNOSIS — I5032 Chronic diastolic (congestive) heart failure: Secondary | ICD-10-CM | POA: Diagnosis not present

## 2021-05-06 DIAGNOSIS — R809 Proteinuria, unspecified: Secondary | ICD-10-CM | POA: Diagnosis not present

## 2021-05-06 DIAGNOSIS — I272 Pulmonary hypertension, unspecified: Secondary | ICD-10-CM | POA: Diagnosis not present

## 2021-05-06 DIAGNOSIS — N1832 Chronic kidney disease, stage 3b: Secondary | ICD-10-CM | POA: Diagnosis not present

## 2021-05-11 ENCOUNTER — Other Ambulatory Visit: Payer: Self-pay | Admitting: Family Medicine

## 2021-05-11 DIAGNOSIS — M810 Age-related osteoporosis without current pathological fracture: Secondary | ICD-10-CM

## 2021-05-12 ENCOUNTER — Other Ambulatory Visit: Payer: Self-pay

## 2021-05-12 ENCOUNTER — Encounter: Payer: Self-pay | Admitting: Cardiology

## 2021-05-12 ENCOUNTER — Ambulatory Visit: Payer: PPO | Admitting: Cardiology

## 2021-05-12 VITALS — BP 111/58 | HR 62 | Ht 65.0 in | Wt 136.4 lb

## 2021-05-12 DIAGNOSIS — I272 Pulmonary hypertension, unspecified: Secondary | ICD-10-CM | POA: Diagnosis not present

## 2021-05-12 DIAGNOSIS — I480 Paroxysmal atrial fibrillation: Secondary | ICD-10-CM | POA: Diagnosis not present

## 2021-05-12 DIAGNOSIS — E7800 Pure hypercholesterolemia, unspecified: Secondary | ICD-10-CM

## 2021-05-12 DIAGNOSIS — I2510 Atherosclerotic heart disease of native coronary artery without angina pectoris: Secondary | ICD-10-CM

## 2021-05-12 DIAGNOSIS — I10 Essential (primary) hypertension: Secondary | ICD-10-CM | POA: Diagnosis not present

## 2021-05-12 DIAGNOSIS — I5032 Chronic diastolic (congestive) heart failure: Secondary | ICD-10-CM

## 2021-05-12 NOTE — Patient Instructions (Signed)
°  Follow-Up: At [facility], you and your health needs are our priority.  As part of our continuing mission to provide you with exceptional heart care, we have created designated Provider Care Teams.  These Care Teams include your primary Cardiologist (physician) and Advanced Practice Providers (APPs -  Physician Assistants and Nurse Practitioners) who all work together to provide you with the care you need, when you need it.  We recommend signing up for the patient portal called "MyChart".  Sign up information is provided on this After Visit Summary.  MyChart is used to connect with patients for Virtual Visits (Telemedicine).  Patients are able to view lab/test results, encounter notes, upcoming appointments, etc.  Non-urgent messages can be sent to your provider as well.   To learn more about what you can do with MyChart, go to [email or web address].    Your next appointment:   6 month(s)  The format for your next appointment:   In Person  Provider:   Kirk Ruths MD

## 2021-05-18 DIAGNOSIS — R9431 Abnormal electrocardiogram [ECG] [EKG]: Secondary | ICD-10-CM | POA: Diagnosis not present

## 2021-05-18 DIAGNOSIS — C679 Malignant neoplasm of bladder, unspecified: Secondary | ICD-10-CM | POA: Diagnosis not present

## 2021-05-18 DIAGNOSIS — Z01810 Encounter for preprocedural cardiovascular examination: Secondary | ICD-10-CM | POA: Diagnosis not present

## 2021-05-18 DIAGNOSIS — Z01812 Encounter for preprocedural laboratory examination: Secondary | ICD-10-CM | POA: Diagnosis not present

## 2021-05-19 DIAGNOSIS — R9431 Abnormal electrocardiogram [ECG] [EKG]: Secondary | ICD-10-CM | POA: Diagnosis not present

## 2021-05-19 DIAGNOSIS — E119 Type 2 diabetes mellitus without complications: Secondary | ICD-10-CM | POA: Diagnosis not present

## 2021-05-19 DIAGNOSIS — D494 Neoplasm of unspecified behavior of bladder: Secondary | ICD-10-CM | POA: Diagnosis not present

## 2021-05-19 DIAGNOSIS — Z8551 Personal history of malignant neoplasm of bladder: Secondary | ICD-10-CM | POA: Diagnosis not present

## 2021-05-19 DIAGNOSIS — C678 Malignant neoplasm of overlapping sites of bladder: Secondary | ICD-10-CM | POA: Diagnosis not present

## 2021-05-19 DIAGNOSIS — N3289 Other specified disorders of bladder: Secondary | ICD-10-CM | POA: Diagnosis not present

## 2021-05-19 DIAGNOSIS — N3020 Other chronic cystitis without hematuria: Secondary | ICD-10-CM | POA: Diagnosis not present

## 2021-05-26 DIAGNOSIS — C678 Malignant neoplasm of overlapping sites of bladder: Secondary | ICD-10-CM | POA: Diagnosis not present

## 2021-06-09 NOTE — Progress Notes (Signed)
x

## 2021-06-16 DIAGNOSIS — M810 Age-related osteoporosis without current pathological fracture: Secondary | ICD-10-CM | POA: Diagnosis not present

## 2021-07-09 DIAGNOSIS — R8290 Unspecified abnormal findings in urine: Secondary | ICD-10-CM | POA: Diagnosis not present

## 2021-07-09 DIAGNOSIS — N3941 Urge incontinence: Secondary | ICD-10-CM | POA: Diagnosis not present

## 2021-07-09 DIAGNOSIS — C678 Malignant neoplasm of overlapping sites of bladder: Secondary | ICD-10-CM | POA: Diagnosis not present

## 2021-07-10 ENCOUNTER — Ambulatory Visit: Payer: Medicare Other | Admitting: Cardiology

## 2021-07-24 DIAGNOSIS — C678 Malignant neoplasm of overlapping sites of bladder: Secondary | ICD-10-CM | POA: Diagnosis not present

## 2021-07-31 DIAGNOSIS — C678 Malignant neoplasm of overlapping sites of bladder: Secondary | ICD-10-CM | POA: Diagnosis not present

## 2021-08-10 DIAGNOSIS — C678 Malignant neoplasm of overlapping sites of bladder: Secondary | ICD-10-CM | POA: Diagnosis not present

## 2021-08-10 DIAGNOSIS — R8290 Unspecified abnormal findings in urine: Secondary | ICD-10-CM | POA: Diagnosis not present

## 2021-08-19 DIAGNOSIS — R8290 Unspecified abnormal findings in urine: Secondary | ICD-10-CM | POA: Diagnosis not present

## 2021-08-19 DIAGNOSIS — C678 Malignant neoplasm of overlapping sites of bladder: Secondary | ICD-10-CM | POA: Diagnosis not present

## 2021-08-31 DIAGNOSIS — E1129 Type 2 diabetes mellitus with other diabetic kidney complication: Secondary | ICD-10-CM | POA: Diagnosis not present

## 2021-08-31 DIAGNOSIS — N1832 Chronic kidney disease, stage 3b: Secondary | ICD-10-CM | POA: Diagnosis not present

## 2021-08-31 DIAGNOSIS — E559 Vitamin D deficiency, unspecified: Secondary | ICD-10-CM | POA: Diagnosis not present

## 2021-08-31 DIAGNOSIS — E7800 Pure hypercholesterolemia, unspecified: Secondary | ICD-10-CM | POA: Diagnosis not present

## 2021-08-31 DIAGNOSIS — E538 Deficiency of other specified B group vitamins: Secondary | ICD-10-CM | POA: Diagnosis not present

## 2021-09-02 DIAGNOSIS — I5032 Chronic diastolic (congestive) heart failure: Secondary | ICD-10-CM | POA: Diagnosis not present

## 2021-09-02 DIAGNOSIS — I272 Pulmonary hypertension, unspecified: Secondary | ICD-10-CM | POA: Diagnosis not present

## 2021-09-02 DIAGNOSIS — E7800 Pure hypercholesterolemia, unspecified: Secondary | ICD-10-CM | POA: Diagnosis not present

## 2021-09-02 DIAGNOSIS — I700 Atherosclerosis of aorta: Secondary | ICD-10-CM | POA: Diagnosis not present

## 2021-09-02 DIAGNOSIS — N1832 Chronic kidney disease, stage 3b: Secondary | ICD-10-CM | POA: Diagnosis not present

## 2021-09-02 DIAGNOSIS — I4891 Unspecified atrial fibrillation: Secondary | ICD-10-CM | POA: Diagnosis not present

## 2021-09-02 DIAGNOSIS — Z1331 Encounter for screening for depression: Secondary | ICD-10-CM | POA: Diagnosis not present

## 2021-09-02 DIAGNOSIS — M810 Age-related osteoporosis without current pathological fracture: Secondary | ICD-10-CM | POA: Diagnosis not present

## 2021-09-02 DIAGNOSIS — I10 Essential (primary) hypertension: Secondary | ICD-10-CM | POA: Diagnosis not present

## 2021-09-02 DIAGNOSIS — E1129 Type 2 diabetes mellitus with other diabetic kidney complication: Secondary | ICD-10-CM | POA: Diagnosis not present

## 2021-09-02 DIAGNOSIS — E559 Vitamin D deficiency, unspecified: Secondary | ICD-10-CM | POA: Diagnosis not present

## 2021-09-02 DIAGNOSIS — R809 Proteinuria, unspecified: Secondary | ICD-10-CM | POA: Diagnosis not present

## 2021-09-13 ENCOUNTER — Other Ambulatory Visit: Payer: Self-pay | Admitting: Cardiology

## 2021-10-05 DIAGNOSIS — N390 Urinary tract infection, site not specified: Secondary | ICD-10-CM | POA: Diagnosis not present

## 2021-11-20 DIAGNOSIS — C678 Malignant neoplasm of overlapping sites of bladder: Secondary | ICD-10-CM | POA: Diagnosis not present

## 2021-11-20 DIAGNOSIS — R8290 Unspecified abnormal findings in urine: Secondary | ICD-10-CM | POA: Diagnosis not present

## 2021-12-07 DIAGNOSIS — C678 Malignant neoplasm of overlapping sites of bladder: Secondary | ICD-10-CM | POA: Diagnosis not present

## 2021-12-07 DIAGNOSIS — R8290 Unspecified abnormal findings in urine: Secondary | ICD-10-CM | POA: Diagnosis not present

## 2021-12-16 DIAGNOSIS — R8290 Unspecified abnormal findings in urine: Secondary | ICD-10-CM | POA: Diagnosis not present

## 2021-12-16 DIAGNOSIS — C678 Malignant neoplasm of overlapping sites of bladder: Secondary | ICD-10-CM | POA: Diagnosis not present

## 2021-12-21 DIAGNOSIS — Z8551 Personal history of malignant neoplasm of bladder: Secondary | ICD-10-CM | POA: Diagnosis not present

## 2021-12-21 DIAGNOSIS — Z08 Encounter for follow-up examination after completed treatment for malignant neoplasm: Secondary | ICD-10-CM | POA: Diagnosis not present

## 2021-12-21 DIAGNOSIS — R896 Abnormal cytological findings in specimens from other organs, systems and tissues: Secondary | ICD-10-CM | POA: Diagnosis not present

## 2021-12-21 DIAGNOSIS — C678 Malignant neoplasm of overlapping sites of bladder: Secondary | ICD-10-CM | POA: Diagnosis not present

## 2022-01-06 DIAGNOSIS — E1129 Type 2 diabetes mellitus with other diabetic kidney complication: Secondary | ICD-10-CM | POA: Diagnosis not present

## 2022-01-06 DIAGNOSIS — E7800 Pure hypercholesterolemia, unspecified: Secondary | ICD-10-CM | POA: Diagnosis not present

## 2022-01-06 DIAGNOSIS — N1832 Chronic kidney disease, stage 3b: Secondary | ICD-10-CM | POA: Diagnosis not present

## 2022-01-06 DIAGNOSIS — I700 Atherosclerosis of aorta: Secondary | ICD-10-CM | POA: Diagnosis not present

## 2022-01-06 DIAGNOSIS — R809 Proteinuria, unspecified: Secondary | ICD-10-CM | POA: Diagnosis not present

## 2022-01-06 DIAGNOSIS — E559 Vitamin D deficiency, unspecified: Secondary | ICD-10-CM | POA: Diagnosis not present

## 2022-01-06 DIAGNOSIS — M810 Age-related osteoporosis without current pathological fracture: Secondary | ICD-10-CM | POA: Diagnosis not present

## 2022-01-06 DIAGNOSIS — I5032 Chronic diastolic (congestive) heart failure: Secondary | ICD-10-CM | POA: Diagnosis not present

## 2022-01-06 DIAGNOSIS — I272 Pulmonary hypertension, unspecified: Secondary | ICD-10-CM | POA: Diagnosis not present

## 2022-01-06 DIAGNOSIS — I10 Essential (primary) hypertension: Secondary | ICD-10-CM | POA: Diagnosis not present

## 2022-01-06 DIAGNOSIS — Z6822 Body mass index (BMI) 22.0-22.9, adult: Secondary | ICD-10-CM | POA: Diagnosis not present

## 2022-01-06 DIAGNOSIS — I4891 Unspecified atrial fibrillation: Secondary | ICD-10-CM | POA: Diagnosis not present

## 2022-01-15 DIAGNOSIS — N390 Urinary tract infection, site not specified: Secondary | ICD-10-CM | POA: Diagnosis not present

## 2022-01-15 DIAGNOSIS — R300 Dysuria: Secondary | ICD-10-CM | POA: Diagnosis not present

## 2022-01-20 DIAGNOSIS — H25813 Combined forms of age-related cataract, bilateral: Secondary | ICD-10-CM | POA: Diagnosis not present

## 2022-01-20 DIAGNOSIS — E119 Type 2 diabetes mellitus without complications: Secondary | ICD-10-CM | POA: Diagnosis not present

## 2022-01-20 DIAGNOSIS — Z7984 Long term (current) use of oral hypoglycemic drugs: Secondary | ICD-10-CM | POA: Diagnosis not present

## 2022-02-09 DIAGNOSIS — N390 Urinary tract infection, site not specified: Secondary | ICD-10-CM | POA: Diagnosis not present

## 2022-02-09 DIAGNOSIS — R8290 Unspecified abnormal findings in urine: Secondary | ICD-10-CM | POA: Diagnosis not present

## 2022-03-08 DIAGNOSIS — H9193 Unspecified hearing loss, bilateral: Secondary | ICD-10-CM | POA: Diagnosis not present

## 2022-03-08 DIAGNOSIS — L723 Sebaceous cyst: Secondary | ICD-10-CM | POA: Diagnosis not present

## 2022-03-08 DIAGNOSIS — N390 Urinary tract infection, site not specified: Secondary | ICD-10-CM | POA: Diagnosis not present

## 2022-03-22 DIAGNOSIS — R8290 Unspecified abnormal findings in urine: Secondary | ICD-10-CM | POA: Diagnosis not present

## 2022-03-22 DIAGNOSIS — C679 Malignant neoplasm of bladder, unspecified: Secondary | ICD-10-CM | POA: Diagnosis not present

## 2022-03-23 DIAGNOSIS — R221 Localized swelling, mass and lump, neck: Secondary | ICD-10-CM | POA: Diagnosis not present

## 2022-03-23 DIAGNOSIS — L723 Sebaceous cyst: Secondary | ICD-10-CM | POA: Diagnosis not present

## 2022-03-29 DIAGNOSIS — Z87440 Personal history of urinary (tract) infections: Secondary | ICD-10-CM | POA: Diagnosis not present

## 2022-03-29 DIAGNOSIS — Z8551 Personal history of malignant neoplasm of bladder: Secondary | ICD-10-CM | POA: Diagnosis not present

## 2022-03-29 DIAGNOSIS — Z08 Encounter for follow-up examination after completed treatment for malignant neoplasm: Secondary | ICD-10-CM | POA: Diagnosis not present

## 2022-03-29 DIAGNOSIS — R8290 Unspecified abnormal findings in urine: Secondary | ICD-10-CM | POA: Diagnosis not present

## 2022-04-08 DIAGNOSIS — H25043 Posterior subcapsular polar age-related cataract, bilateral: Secondary | ICD-10-CM | POA: Diagnosis not present

## 2022-04-08 DIAGNOSIS — H18413 Arcus senilis, bilateral: Secondary | ICD-10-CM | POA: Diagnosis not present

## 2022-04-08 DIAGNOSIS — H2513 Age-related nuclear cataract, bilateral: Secondary | ICD-10-CM | POA: Diagnosis not present

## 2022-04-08 DIAGNOSIS — H2512 Age-related nuclear cataract, left eye: Secondary | ICD-10-CM | POA: Diagnosis not present

## 2022-04-08 DIAGNOSIS — H40023 Open angle with borderline findings, high risk, bilateral: Secondary | ICD-10-CM | POA: Diagnosis not present

## 2022-04-08 DIAGNOSIS — H25013 Cortical age-related cataract, bilateral: Secondary | ICD-10-CM | POA: Diagnosis not present

## 2022-04-16 ENCOUNTER — Telehealth: Payer: Self-pay

## 2022-04-16 NOTE — Telephone Encounter (Signed)
   Pre-operative Risk Assessment    Patient Name: Ann Hunter  DOB: 1945-10-08 MRN: [id number]      Request for Surgical Clearance    Procedure:   Neck Mass Excision   Date of Surgery:  Clearance TBD                                 Surgeon:  Dr. Noberto Retort Surgeon's Group or Practice Name:  [facility] Phone number:  [phone] Fax number:  [phone]   Type of Clearance Requested:   - Medical    Type of Anesthesia:  General    Additional requests/questions:    SignedJacqulynn Cadet   04/16/2022, 3:09 PM

## 2022-04-16 NOTE — Telephone Encounter (Signed)
   Name: Ann Hunter  DOB: July 17, 1945  MRN: [id number]  Primary Cardiologist: Kirk Ruths, MD   Preoperative team, please contact this patient and set up a phone call appointment for further preoperative risk assessment. Please obtain consent and complete medication review. Thank you for your help.  I confirm that guidance regarding antiplatelet and oral anticoagulation therapy has been completed and, if necessary, noted below.  No medications requested   Mable Fill, Marissa Nestle, NP 04/16/2022, 3:26 PM [place]

## 2022-04-20 ENCOUNTER — Telehealth: Payer: Self-pay | Admitting: *Deleted

## 2022-04-20 NOTE — Telephone Encounter (Signed)
Patient scheduled 1-10- 24 Med Rec and Consent done

## 2022-04-20 NOTE — Telephone Encounter (Signed)
Patient scheduled 1-10- 24 Med Rec and Consent done    Patient Consent for Virtual Visit        Ann Hunter has provided verbal consent on 04/20/2022 for a virtual visit (video or telephone).   CONSENT FOR VIRTUAL VISIT FOR:  Ann Hunter  By participating in this virtual visit I agree to the following:  I hereby voluntarily request, consent and authorize [facility] and its employed or contracted physicians, physician assistants, nurse practitioners or other licensed health care professionals (the Practitioner), to provide me with telemedicine health care services (the "Services") as deemed necessary by the treating Practitioner. I acknowledge and consent to receive the Services by the Practitioner via telemedicine. I understand that the telemedicine visit will involve communicating with the Practitioner through live audiovisual communication technology and the disclosure of certain medical information by electronic transmission. I acknowledge that I have been given the opportunity to request an in-person assessment or other available alternative prior to the telemedicine visit and am voluntarily participating in the telemedicine visit.  I understand that I have the right to withhold or withdraw my consent to the use of telemedicine in the course of my care at any time, without affecting my right to future care or treatment, and that the Practitioner or I may terminate the telemedicine visit at any time. I understand that I have the right to inspect all information obtained and/or recorded in the course of the telemedicine visit and may receive copies of available information for a reasonable fee.  I understand that some of the potential risks of receiving the Services via telemedicine include:  Delay or interruption in medical evaluation due to technological equipment failure or disruption; Information transmitted may not be sufficient (e.g. poor resolution of images) to allow for  appropriate medical decision making by the Practitioner; and/or  In rare instances, security protocols could fail, causing a breach of personal health information.  Furthermore, I acknowledge that it is my responsibility to provide information about my medical history, conditions and care that is complete and accurate to the best of my ability. I acknowledge that Practitioner's advice, recommendations, and/or decision may be based on factors not within their control, such as incomplete or inaccurate data provided by me or distortions of diagnostic images or specimens that may result from electronic transmissions. I understand that the practice of medicine is not an exact science and that Practitioner makes no warranties or guarantees regarding treatment outcomes. I acknowledge that a copy of this consent can be made available to me via my patient portal ([facility]), or I can request a printed copy by calling the office of [facility].    I understand that my insurance will be billed for this visit.   I have read or had this consent read to me. I understand the contents of this consent, which adequately explains the benefits and risks of the Services being provided via telemedicine.  I have been provided ample opportunity to ask questions regarding this consent and the Services and have had my questions answered to my satisfaction. I give my informed consent for the services to be provided through the use of telemedicine in my medical care

## 2022-04-21 ENCOUNTER — Ambulatory Visit: Payer: PPO | Attending: Internal Medicine | Admitting: Physician Assistant

## 2022-04-21 DIAGNOSIS — Z01810 Encounter for preprocedural cardiovascular examination: Secondary | ICD-10-CM | POA: Diagnosis not present

## 2022-04-21 NOTE — Progress Notes (Signed)
Virtual Visit via Telephone Note   Because of Ann Hunter's co-morbid illnesses, she is at least at moderate risk for complications without adequate follow up.  This format is felt to be most appropriate for this patient at this time.  The patient did not have access to video technology/had technical difficulties with video requiring transitioning to audio format only (telephone).  All issues noted in this document were discussed and addressed.  No physical exam could be performed with this format.  Please refer to the patient's chart for her consent to telehealth for [facility]  Evaluation Performed:  Preoperative cardiovascular risk assessment _____________   Date:  04/21/2022   Patient ID:  Ann Hunter, DOB [birth year], MRN [id number] Patient Location:  Home Provider location:   Office  Primary Care Provider:  Leonides Sake, MD Primary Cardiologist:  Kirk Ruths, MD  Chief Complaint / Patient Profile   77 y.o. y/o female with a h/o atrial fibrillation in the setting of urosepsis nonobstructive CAD (cath 5/17), grade 2 DD, pleural effusions with mild edema, type 2 diabetes mellitus, hypertension, hyperlipidemia, GERD who is pending neck mass excision and presents today for telephonic preoperative cardiovascular risk assessment.  History of Present Illness    Ann Hunter is a 77 y.o. female who presents via audio/video conferencing for a telehealth visit today.  Pt was last seen in cardiology clinic on 05/12/2021 by Dr. Stanford Breed.  At that time [name] was doing well.  The patient is now pending procedure as outlined above. Since her last visit, she states that she is getting a cyst removed from her neck.  She has not had any chest pain or shortness of breath.  No palpitations.  She is fairly active.  She can walk to her trash cans and back.  She states that she cannot swim and "sinks like a rock".  Her surgery is scheduled for 1/12.  She is scored a 5.07 on  the DASI.  This exceeds the 4 METS minimum requirement.  No medications indicated as needing held.  Past Medical History    Past Medical History:  Diagnosis Date   Acute diastolic CHF (congestive heart failure) ([place]) 01/21/2016   B12 deficiency    Bladder cancer (HCC)    Closed head injury    Essential hypertension, benign    GERD (gastroesophageal reflux disease)    Head injury, unspecified    Memory difficulty    d/t head injury   Osteoporosis, unspecified    Paroxysmal atrial fibrillation ([place])    during septic shock, none since   Pulmonary hypertension ([place])    Pure hypercholesterolemia    Type II or unspecified type diabetes mellitus without mention of complication, uncontrolled    Unspecified vitamin D deficiency    Past Surgical History:  Procedure Laterality Date   APPENDECTOMY  1981   CARDIAC CATHETERIZATION     CARDIAC CATHETERIZATION N/A 08/13/2015   Procedure: Right/Left Heart Cath and Coronary Angiography;  Surgeon: Peter M Martinique, MD;  Location: [facility];  Service: Cardiovascular;  Laterality: N/A;   TONSILLECTOMY     TOTAL ABDOMINAL HYSTERECTOMY  1981    Allergies  Allergies  Allergen Reactions   Advil [Ibuprofen] Swelling   Codeine Shortness Of Breath    Throat Swelling   Letairis [Ambrisentan] Shortness Of Breath   Levofloxacin Other (See Comments)   Ranitidine Anaphylaxis   Sitagliptin Anaphylaxis and Swelling   Ace Inhibitors    Aleve [Naproxen Sodium]    Boniva [  Ibandronic Acid] Diarrhea    Swelling    Canagliflozin Other (See Comments)    Possible start of bladder cancer   Clonidine Derivatives    Januvia [Sitagliptin Phosphate]    Penicillins Swelling    Has patient had a PCN reaction causing immediate rash, facial/tongue/throat swelling, SOB or lightheadedness with hypotension: Yes Has patient had a PCN reaction causing severe rash involving mucus membranes or skin necrosis: No Has patient had a PCN reaction that required  hospitalization No Has patient had a PCN reaction occurring within the last 10 years: No If all of the above answers are "NO", then may proceed with Cephalosporin use.     Sulfa Antibiotics     Throat swells    Home Medications    Prior to Admission medications   Medication Sig Start Date End Date Taking? Authorizing Provider  carvedilol (COREG) 25 MG tablet TAKE 1 TABLET TWICE A DAY FOR 90 DAY(S) 10/01/15   [provider]  CONTOUR NEXT TEST test strip USE TO CHECK BLOOD SUGAR EACH MORNING 10/13/18   [provider]  diphenhydrAMINE (BENADRYL) 25 mg capsule Take 25 mg by mouth every 6 (six) hours as needed.    [provider]  glimepiride (AMARYL) 4 MG tablet Take 4 mg by mouth 2 (two) times daily.    [provider]  hydrALAZINE (APRESOLINE) 100 MG tablet TAKE 1 TABLET BY MOUTH TWICE A DAY 09/14/21   Lelon Perla, MD  metFORMIN (GLUCOPHAGE) 500 MG tablet Take 500 mg by mouth 2 (two) times daily. 09/22/16   [provider]  pravastatin (PRAVACHOL) 40 MG tablet Take 40 mg by mouth daily. 11/29/11   [provider]  trimethoprim (TRIMPEX) 100 MG tablet Take 100 mg by mouth daily.    [provider]    Physical Exam    Vital Signs:  [name] does not have vital signs available for review today.  Given telephonic nature of communication, physical exam is limited. AAOx3. NAD. Normal affect.  Speech and respirations are unlabored.  Accessory Clinical Findings    None  Assessment & Plan    1.  Preoperative Cardiovascular Risk Assessment:  Ms. Jaskiewicz perioperative risk of a major cardiac event is 0.9% according to the Revised Cardiac Risk Index (RCRI).  Therefore, she is at low risk for perioperative complications.   Her functional capacity is good at 5.07 METs according to the [place] Activity Status Index (DASI). Recommendations: According to ACC/AHA guidelines, no further cardiovascular testing needed.  The patient  may proceed to surgery at acceptable risk.     The patient was advised that if she develops new symptoms prior to surgery to contact our office to arrange for a follow-up visit, and she verbalized understanding.   A copy of this note will be routed to requesting surgeon.  Time:   Today, I have spent 12 minutes with the patient with telehealth technology discussing medical history, symptoms, and management plan.     Elgie Collard, PA-C  04/21/2022, 2:31 PM

## 2022-04-23 DIAGNOSIS — I10 Essential (primary) hypertension: Secondary | ICD-10-CM | POA: Diagnosis not present

## 2022-04-23 DIAGNOSIS — L723 Sebaceous cyst: Secondary | ICD-10-CM | POA: Diagnosis not present

## 2022-04-23 DIAGNOSIS — I509 Heart failure, unspecified: Secondary | ICD-10-CM | POA: Diagnosis not present

## 2022-04-23 DIAGNOSIS — R221 Localized swelling, mass and lump, neck: Secondary | ICD-10-CM | POA: Diagnosis not present

## 2022-04-23 DIAGNOSIS — E119 Type 2 diabetes mellitus without complications: Secondary | ICD-10-CM | POA: Diagnosis not present

## 2022-04-23 DIAGNOSIS — M810 Age-related osteoporosis without current pathological fracture: Secondary | ICD-10-CM | POA: Diagnosis not present

## 2022-04-23 DIAGNOSIS — C679 Malignant neoplasm of bladder, unspecified: Secondary | ICD-10-CM | POA: Diagnosis not present

## 2022-04-23 DIAGNOSIS — Z7984 Long term (current) use of oral hypoglycemic drugs: Secondary | ICD-10-CM | POA: Diagnosis not present

## 2022-04-23 DIAGNOSIS — Z79899 Other long term (current) drug therapy: Secondary | ICD-10-CM | POA: Diagnosis not present

## 2022-04-23 DIAGNOSIS — I110 Hypertensive heart disease with heart failure: Secondary | ICD-10-CM | POA: Diagnosis not present

## 2022-05-10 DIAGNOSIS — E559 Vitamin D deficiency, unspecified: Secondary | ICD-10-CM | POA: Diagnosis not present

## 2022-05-10 DIAGNOSIS — I4891 Unspecified atrial fibrillation: Secondary | ICD-10-CM | POA: Diagnosis not present

## 2022-05-10 DIAGNOSIS — I272 Pulmonary hypertension, unspecified: Secondary | ICD-10-CM | POA: Diagnosis not present

## 2022-05-10 DIAGNOSIS — N1832 Chronic kidney disease, stage 3b: Secondary | ICD-10-CM | POA: Diagnosis not present

## 2022-05-10 DIAGNOSIS — E7800 Pure hypercholesterolemia, unspecified: Secondary | ICD-10-CM | POA: Diagnosis not present

## 2022-05-10 DIAGNOSIS — M810 Age-related osteoporosis without current pathological fracture: Secondary | ICD-10-CM | POA: Diagnosis not present

## 2022-05-10 DIAGNOSIS — R809 Proteinuria, unspecified: Secondary | ICD-10-CM | POA: Diagnosis not present

## 2022-05-10 DIAGNOSIS — I5032 Chronic diastolic (congestive) heart failure: Secondary | ICD-10-CM | POA: Diagnosis not present

## 2022-05-10 DIAGNOSIS — E1129 Type 2 diabetes mellitus with other diabetic kidney complication: Secondary | ICD-10-CM | POA: Diagnosis not present

## 2022-05-10 DIAGNOSIS — I700 Atherosclerosis of aorta: Secondary | ICD-10-CM | POA: Diagnosis not present

## 2022-05-10 DIAGNOSIS — I10 Essential (primary) hypertension: Secondary | ICD-10-CM | POA: Diagnosis not present

## 2022-05-26 DIAGNOSIS — Z79899 Other long term (current) drug therapy: Secondary | ICD-10-CM | POA: Diagnosis not present

## 2022-06-03 DIAGNOSIS — R809 Proteinuria, unspecified: Secondary | ICD-10-CM | POA: Diagnosis not present

## 2022-06-03 DIAGNOSIS — E1129 Type 2 diabetes mellitus with other diabetic kidney complication: Secondary | ICD-10-CM | POA: Diagnosis not present

## 2022-06-04 DIAGNOSIS — E875 Hyperkalemia: Secondary | ICD-10-CM | POA: Diagnosis not present

## 2022-06-10 DIAGNOSIS — H02831 Dermatochalasis of right upper eyelid: Secondary | ICD-10-CM | POA: Diagnosis not present

## 2022-06-10 DIAGNOSIS — H2513 Age-related nuclear cataract, bilateral: Secondary | ICD-10-CM | POA: Diagnosis not present

## 2022-06-10 DIAGNOSIS — H25013 Cortical age-related cataract, bilateral: Secondary | ICD-10-CM | POA: Diagnosis not present

## 2022-06-10 DIAGNOSIS — H401131 Primary open-angle glaucoma, bilateral, mild stage: Secondary | ICD-10-CM | POA: Diagnosis not present

## 2022-06-10 DIAGNOSIS — H02834 Dermatochalasis of left upper eyelid: Secondary | ICD-10-CM | POA: Diagnosis not present

## 2022-06-10 DIAGNOSIS — H18413 Arcus senilis, bilateral: Secondary | ICD-10-CM | POA: Diagnosis not present

## 2022-06-10 DIAGNOSIS — H25043 Posterior subcapsular polar age-related cataract, bilateral: Secondary | ICD-10-CM | POA: Diagnosis not present

## 2022-06-10 DIAGNOSIS — H401232 Low-tension glaucoma, bilateral, moderate stage: Secondary | ICD-10-CM | POA: Diagnosis not present

## 2022-06-23 DIAGNOSIS — H2512 Age-related nuclear cataract, left eye: Secondary | ICD-10-CM | POA: Diagnosis not present

## 2022-06-24 DIAGNOSIS — H2511 Age-related nuclear cataract, right eye: Secondary | ICD-10-CM | POA: Diagnosis not present

## 2022-06-29 DIAGNOSIS — R8290 Unspecified abnormal findings in urine: Secondary | ICD-10-CM | POA: Diagnosis not present

## 2022-07-15 DIAGNOSIS — R82998 Other abnormal findings in urine: Secondary | ICD-10-CM | POA: Diagnosis not present

## 2022-07-15 DIAGNOSIS — Z08 Encounter for follow-up examination after completed treatment for malignant neoplasm: Secondary | ICD-10-CM | POA: Diagnosis not present

## 2022-07-15 DIAGNOSIS — Z8551 Personal history of malignant neoplasm of bladder: Secondary | ICD-10-CM | POA: Diagnosis not present

## 2022-07-15 DIAGNOSIS — C679 Malignant neoplasm of bladder, unspecified: Secondary | ICD-10-CM | POA: Diagnosis not present

## 2022-07-28 NOTE — Progress Notes (Signed)
HPI:FU diastolic CHF. Patient previously had an episode of atrial fibrillation in the setting of urosepsis. High-resolution CT November 2016 did not show interstitial lung disease. There was note of three-vessel coronary artery calcification. CPX March 2017 suggested functional limitation due to circulatory limitation. Findings suggestive of diastolic dysfunction and pulmonary hypertension. Cath 5/17 showed no obstructive CAD, normal LV function and moderate pulmonary HTN with normal LV filling pressures. VQ 5/17 negative. Echo 10/17 showed normal LV function, grade 2 diastolic dysfunction, mild LAE. Chest CT 10/17 showed no PE; pleural effusions and mild edema. [facility] 11/17 showed RA 10, PA mean 50 and PCWP 26. Dyspnea now felt related to chronic diastolic CHF. Since last seen, she denies dyspnea, chest pain, palpitations or syncope.  No pedal edema.  She is unsteady on her feet.  Current Outpatient Medications  Medication Sig Dispense Refill   carvedilol (COREG) 25 MG tablet TAKE 1 TABLET TWICE A DAY FOR 90 DAY(S)  1   CONTOUR NEXT TEST test strip USE TO CHECK BLOOD SUGAR EACH MORNING     diphenhydrAMINE (BENADRYL) 25 mg capsule Take 25 mg by mouth every 6 (six) hours as needed.     glimepiride (AMARYL) 4 MG tablet Take 4 mg by mouth 2 (two) times daily.     hydrALAZINE (APRESOLINE) 100 MG tablet TAKE 1 TABLET BY MOUTH TWICE A DAY 180 tablet 3   losartan (COZAAR) 50 MG tablet Take 50 mg by mouth daily.     metFORMIN (GLUCOPHAGE) 500 MG tablet Take 500 mg by mouth 2 (two) times daily.  3   pravastatin (PRAVACHOL) 40 MG tablet Take 40 mg by mouth daily.     No current facility-administered medications for this visit.     Past Medical History:  Diagnosis Date   Acute diastolic CHF (congestive heart failure) (HCC) 01/21/2016   B12 deficiency    Bladder cancer (HCC)    Closed head injury    Essential hypertension, benign    GERD (gastroesophageal reflux disease)    Head injury,  unspecified    Memory difficulty    d/t head injury   Osteoporosis, unspecified    Paroxysmal atrial fibrillation (HCC)    during septic shock, none since   Pulmonary hypertension (HCC)    Pure hypercholesterolemia    Type II or unspecified type diabetes mellitus without mention of complication, uncontrolled    Unspecified vitamin D deficiency     Past Surgical History:  Procedure Laterality Date   APPENDECTOMY  1981   CARDIAC CATHETERIZATION     CARDIAC CATHETERIZATION N/A 08/13/2015   Procedure: Right/Left Heart Cath and Coronary Angiography;  Surgeon: Peter M Swazil[place], MD;  Location: [facility];  Service: Cardiovascular;  Laterality: N/A;   TONSILLECTOMY     TOTAL ABDOMINAL HYSTERECTOMY  1981    Social History   Socioeconomic History   Marital status: Married    Spouse name: Not on file   Number of children: 3   Years of education: Not on file   Highest education level: Not on file  Occupational History   Not on file  Tobacco Use   Smoking status: Never   Smokeless tobacco: Never  Substance and Sexual Activity   Alcohol use: No    Alcohol/week: 0.0 standard drinks of alcohol   Drug use: No   Sexual activity: Not Currently  Other Topics Concern   Not on file  Social History Narrative   Married 3 sons   Current work status :  retired   No illicit drug use   Alcohol use : No alcohol use   Tobacco use: Never smoke   Education: college            Chemical engineer Strain: Not on file  Food Insecurity: Not on file  Transportation Needs: Not on file  Physical Activity: Not on file  Stress: Not on file  Social Connections: Not on file  Intimate Partner Violence: Not on file    Family History  Problem Relation Age of Onset   Coronary artery disease Mother        MI   Heart disease Father        CHF   Diabetes Sister    Diabetes Sister    Thyroid disease Sister    Arthritis Sister    Diabetes Sister     ROS: no  fevers or chills, productive cough, hemoptysis, dysphasia, odynophagia, melena, hematochezia, dysuria, hematuria, rash, seizure activity, orthopnea, PND, pedal edema, claudication. Remaining systems are negative.  Physical Exam: Well-developed well-nourished in no acute distress.  Skin is warm and dry.  HEENT is normal.  Neck is supple.  Chest is clear to auscultation with normal expansion.  Cardiovascular exam is regular rate and rhythm.  Abdominal exam nontender or distended. No masses palpated. Extremities show no edema. neuro grossly intact  ECG-sinus bradycardia at a rate of 52, left ventricular hypertrophy, nonspecific ST changes.  Personally reviewed  A/P  1 chronic diastolic congestive heart failure-patient remains euvolemic on examination.  She will continue diuretic as needed.  Continue fluid restriction and low-sodium diet.  2 hypertension-patient's blood pressure is controlled.  Continue present medications and follow-up.  Check potassium and renal function.  3 hyperlipidemia-continue statin.  Check lipids and liver.  4 remote history of atrial fibrillation-occurred in the setting of urosepsis and was felt to be an isolated event.  She is therefore not anticoagulated and there is been no documented recurrences.  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  No change in diuretic dose as she is euvolemic.  6 aortic atherosclerosis-continue statin.  Olga Millers, MD

## 2022-08-04 DIAGNOSIS — H2511 Age-related nuclear cataract, right eye: Secondary | ICD-10-CM | POA: Diagnosis not present

## 2022-08-06 ENCOUNTER — Encounter: Payer: Self-pay | Admitting: Cardiology

## 2022-08-06 ENCOUNTER — Ambulatory Visit: Payer: PPO | Attending: Cardiology | Admitting: Cardiology

## 2022-08-06 VITALS — BP 140/58 | HR 52 | Ht 64.0 in | Wt 124.8 lb

## 2022-08-06 DIAGNOSIS — I272 Pulmonary hypertension, unspecified: Secondary | ICD-10-CM

## 2022-08-06 DIAGNOSIS — I480 Paroxysmal atrial fibrillation: Secondary | ICD-10-CM

## 2022-08-06 DIAGNOSIS — I10 Essential (primary) hypertension: Secondary | ICD-10-CM

## 2022-08-06 DIAGNOSIS — E7800 Pure hypercholesterolemia, unspecified: Secondary | ICD-10-CM

## 2022-08-06 DIAGNOSIS — I5032 Chronic diastolic (congestive) heart failure: Secondary | ICD-10-CM | POA: Diagnosis not present

## 2022-08-06 DIAGNOSIS — I2510 Atherosclerotic heart disease of native coronary artery without angina pectoris: Secondary | ICD-10-CM | POA: Diagnosis not present

## 2022-08-06 NOTE — Patient Instructions (Signed)
    Follow-Up: At [facility], you and your health needs are our priority.  As part of our continuing mission to provide you with exceptional heart care, we have created designated Provider Care Teams.  These Care Teams include your primary Cardiologist (physician) and Advanced Practice Providers (APPs -  Physician Assistants and Nurse Practitioners) who all work together to provide you with the care you need, when you need it.  We recommend signing up for the patient portal called "MyChart".  Sign up information is provided on this After Visit Summary.  MyChart is used to connect with patients for Virtual Visits (Telemedicine).  Patients are able to view lab/test results, encounter notes, upcoming appointments, etc.  Non-urgent messages can be sent to your provider as well.   To learn more about what you can do with MyChart, go to [email or web address].    Your next appointment:   12 month(s)  Provider:   Brian Crenshaw, MD     

## 2022-08-07 LAB — LIPID PANEL
Chol/HDL Ratio: 4 ratio (ref 0.0–4.4)
Cholesterol, Total: 182 mg/dL (ref 100–199)
HDL: 46 mg/dL (ref >39)
LDL Chol Calc (NIH): 114 mg/dL — ABNORMAL HIGH (ref 0–99)
Triglycerides: 123 mg/dL (ref 0–149)
VLDL Cholesterol Cal: 22 mg/dL (ref 5–40)

## 2022-08-07 LAB — COMPREHENSIVE METABOLIC PANEL
ALT: 11 IU/L (ref 0–32)
AST: 13 IU/L (ref 0–40)
Albumin/Globulin Ratio: 1.9 (ref 1.2–2.2)
Albumin: 3.7 g/dL — ABNORMAL LOW (ref 3.8–4.8)
Alkaline Phosphatase: 60 IU/L (ref 44–121)
BUN/Creatinine Ratio: 21 (ref 12–28)
BUN: 30 mg/dL — ABNORMAL HIGH (ref 8–27)
Bilirubin Total: 0.4 mg/dL (ref 0.0–1.2)
CO2: 21 mmol/L (ref 20–29)
Calcium: 10.2 mg/dL (ref 8.7–10.3)
Chloride: 101 mmol/L (ref 96–106)
Creatinine, Ser: 1.41 mg/dL — ABNORMAL HIGH (ref 0.57–1.00)
Globulin, Total: 1.9 g/dL (ref 1.5–4.5)
Glucose: 361 mg/dL — ABNORMAL HIGH (ref 70–99)
Potassium: 5.3 mmol/L — ABNORMAL HIGH (ref 3.5–5.2)
Sodium: 136 mmol/L (ref 134–144)
Total Protein: 5.6 g/dL — ABNORMAL LOW (ref 6.0–8.5)
eGFR: 38 mL/min/{1.73_m2} — ABNORMAL LOW (ref >59)

## 2022-08-09 ENCOUNTER — Encounter: Payer: Self-pay | Admitting: *Deleted

## 2022-08-20 DIAGNOSIS — R809 Proteinuria, unspecified: Secondary | ICD-10-CM | POA: Diagnosis not present

## 2022-08-20 DIAGNOSIS — I5032 Chronic diastolic (congestive) heart failure: Secondary | ICD-10-CM | POA: Diagnosis not present

## 2022-08-20 DIAGNOSIS — I10 Essential (primary) hypertension: Secondary | ICD-10-CM | POA: Diagnosis not present

## 2022-08-20 DIAGNOSIS — E1129 Type 2 diabetes mellitus with other diabetic kidney complication: Secondary | ICD-10-CM | POA: Diagnosis not present

## 2022-08-20 DIAGNOSIS — E559 Vitamin D deficiency, unspecified: Secondary | ICD-10-CM | POA: Diagnosis not present

## 2022-08-20 DIAGNOSIS — I700 Atherosclerosis of aorta: Secondary | ICD-10-CM | POA: Diagnosis not present

## 2022-08-20 DIAGNOSIS — E7800 Pure hypercholesterolemia, unspecified: Secondary | ICD-10-CM | POA: Diagnosis not present

## 2022-08-20 DIAGNOSIS — N1832 Chronic kidney disease, stage 3b: Secondary | ICD-10-CM | POA: Diagnosis not present

## 2022-08-20 DIAGNOSIS — I272 Pulmonary hypertension, unspecified: Secondary | ICD-10-CM | POA: Diagnosis not present

## 2022-08-20 DIAGNOSIS — I4891 Unspecified atrial fibrillation: Secondary | ICD-10-CM | POA: Diagnosis not present

## 2022-08-20 DIAGNOSIS — M810 Age-related osteoporosis without current pathological fracture: Secondary | ICD-10-CM | POA: Diagnosis not present

## 2022-09-30 DIAGNOSIS — H401132 Primary open-angle glaucoma, bilateral, moderate stage: Secondary | ICD-10-CM | POA: Diagnosis not present

## 2022-09-30 DIAGNOSIS — H47233 Glaucomatous optic atrophy, bilateral: Secondary | ICD-10-CM | POA: Diagnosis not present

## 2022-11-16 DIAGNOSIS — N390 Urinary tract infection, site not specified: Secondary | ICD-10-CM | POA: Diagnosis not present

## 2022-12-04 ENCOUNTER — Other Ambulatory Visit: Payer: Self-pay | Admitting: Cardiology

## 2022-12-31 DIAGNOSIS — E1129 Type 2 diabetes mellitus with other diabetic kidney complication: Secondary | ICD-10-CM | POA: Diagnosis not present

## 2022-12-31 DIAGNOSIS — Z1331 Encounter for screening for depression: Secondary | ICD-10-CM | POA: Diagnosis not present

## 2022-12-31 DIAGNOSIS — I4891 Unspecified atrial fibrillation: Secondary | ICD-10-CM | POA: Diagnosis not present

## 2022-12-31 DIAGNOSIS — I272 Pulmonary hypertension, unspecified: Secondary | ICD-10-CM | POA: Diagnosis not present

## 2022-12-31 DIAGNOSIS — I700 Atherosclerosis of aorta: Secondary | ICD-10-CM | POA: Diagnosis not present

## 2022-12-31 DIAGNOSIS — Z9181 History of falling: Secondary | ICD-10-CM | POA: Diagnosis not present

## 2022-12-31 DIAGNOSIS — I10 Essential (primary) hypertension: Secondary | ICD-10-CM | POA: Diagnosis not present

## 2022-12-31 DIAGNOSIS — Z139 Encounter for screening, unspecified: Secondary | ICD-10-CM | POA: Diagnosis not present

## 2022-12-31 DIAGNOSIS — E7800 Pure hypercholesterolemia, unspecified: Secondary | ICD-10-CM | POA: Diagnosis not present

## 2022-12-31 DIAGNOSIS — N1832 Chronic kidney disease, stage 3b: Secondary | ICD-10-CM | POA: Diagnosis not present

## 2022-12-31 DIAGNOSIS — E559 Vitamin D deficiency, unspecified: Secondary | ICD-10-CM | POA: Diagnosis not present

## 2022-12-31 DIAGNOSIS — R809 Proteinuria, unspecified: Secondary | ICD-10-CM | POA: Diagnosis not present

## 2022-12-31 DIAGNOSIS — E538 Deficiency of other specified B group vitamins: Secondary | ICD-10-CM | POA: Diagnosis not present

## 2022-12-31 DIAGNOSIS — I5032 Chronic diastolic (congestive) heart failure: Secondary | ICD-10-CM | POA: Diagnosis not present

## 2023-01-03 DIAGNOSIS — H47233 Glaucomatous optic atrophy, bilateral: Secondary | ICD-10-CM | POA: Diagnosis not present

## 2023-01-03 DIAGNOSIS — H401132 Primary open-angle glaucoma, bilateral, moderate stage: Secondary | ICD-10-CM | POA: Diagnosis not present

## 2023-01-03 DIAGNOSIS — H4010X2 Unspecified open-angle glaucoma, moderate stage: Secondary | ICD-10-CM | POA: Diagnosis not present

## 2023-01-03 DIAGNOSIS — H4020X2 Unspecified primary angle-closure glaucoma, moderate stage: Secondary | ICD-10-CM | POA: Diagnosis not present

## 2023-01-31 DIAGNOSIS — S51811A Laceration without foreign body of right forearm, initial encounter: Secondary | ICD-10-CM | POA: Diagnosis not present

## 2023-04-11 DIAGNOSIS — E119 Type 2 diabetes mellitus without complications: Secondary | ICD-10-CM | POA: Diagnosis not present

## 2023-04-11 DIAGNOSIS — Z7984 Long term (current) use of oral hypoglycemic drugs: Secondary | ICD-10-CM | POA: Diagnosis not present

## 2023-04-11 DIAGNOSIS — Z961 Presence of intraocular lens: Secondary | ICD-10-CM | POA: Diagnosis not present

## 2023-04-11 DIAGNOSIS — H524 Presbyopia: Secondary | ICD-10-CM | POA: Diagnosis not present

## 2023-04-11 DIAGNOSIS — Z9849 Cataract extraction status, unspecified eye: Secondary | ICD-10-CM | POA: Diagnosis not present

## 2023-04-11 DIAGNOSIS — H5203 Hypermetropia, bilateral: Secondary | ICD-10-CM | POA: Diagnosis not present

## 2023-04-11 DIAGNOSIS — E113292 Type 2 diabetes mellitus with mild nonproliferative diabetic retinopathy without macular edema, left eye: Secondary | ICD-10-CM | POA: Diagnosis not present

## 2023-04-11 DIAGNOSIS — H47233 Glaucomatous optic atrophy, bilateral: Secondary | ICD-10-CM | POA: Diagnosis not present

## 2023-04-11 DIAGNOSIS — H52223 Regular astigmatism, bilateral: Secondary | ICD-10-CM | POA: Diagnosis not present

## 2023-04-11 DIAGNOSIS — H401132 Primary open-angle glaucoma, bilateral, moderate stage: Secondary | ICD-10-CM | POA: Diagnosis not present

## 2023-04-14 DIAGNOSIS — I2510 Atherosclerotic heart disease of native coronary artery without angina pectoris: Secondary | ICD-10-CM | POA: Diagnosis not present

## 2023-04-14 DIAGNOSIS — Z7409 Other reduced mobility: Secondary | ICD-10-CM | POA: Diagnosis not present

## 2023-04-14 DIAGNOSIS — Z789 Other specified health status: Secondary | ICD-10-CM | POA: Diagnosis not present

## 2023-04-14 DIAGNOSIS — I272 Pulmonary hypertension, unspecified: Secondary | ICD-10-CM | POA: Diagnosis not present

## 2023-04-14 DIAGNOSIS — L84 Corns and callosities: Secondary | ICD-10-CM | POA: Diagnosis not present

## 2023-04-14 DIAGNOSIS — I5032 Chronic diastolic (congestive) heart failure: Secondary | ICD-10-CM | POA: Diagnosis not present

## 2023-05-06 DIAGNOSIS — I10 Essential (primary) hypertension: Secondary | ICD-10-CM | POA: Diagnosis not present

## 2023-05-06 DIAGNOSIS — I272 Pulmonary hypertension, unspecified: Secondary | ICD-10-CM | POA: Diagnosis not present

## 2023-05-06 DIAGNOSIS — I5032 Chronic diastolic (congestive) heart failure: Secondary | ICD-10-CM | POA: Diagnosis not present

## 2023-05-06 DIAGNOSIS — M810 Age-related osteoporosis without current pathological fracture: Secondary | ICD-10-CM | POA: Diagnosis not present

## 2023-05-06 DIAGNOSIS — N1832 Chronic kidney disease, stage 3b: Secondary | ICD-10-CM | POA: Diagnosis not present

## 2023-05-06 DIAGNOSIS — E7800 Pure hypercholesterolemia, unspecified: Secondary | ICD-10-CM | POA: Diagnosis not present

## 2023-05-06 DIAGNOSIS — M545 Low back pain, unspecified: Secondary | ICD-10-CM | POA: Diagnosis not present

## 2023-05-06 DIAGNOSIS — R809 Proteinuria, unspecified: Secondary | ICD-10-CM | POA: Diagnosis not present

## 2023-05-06 DIAGNOSIS — E1129 Type 2 diabetes mellitus with other diabetic kidney complication: Secondary | ICD-10-CM | POA: Diagnosis not present

## 2023-05-06 DIAGNOSIS — I4891 Unspecified atrial fibrillation: Secondary | ICD-10-CM | POA: Diagnosis not present

## 2023-05-06 DIAGNOSIS — R8281 Pyuria: Secondary | ICD-10-CM | POA: Diagnosis not present

## 2023-06-13 ENCOUNTER — Other Ambulatory Visit: Payer: Self-pay | Admitting: Family Medicine

## 2023-06-13 DIAGNOSIS — N6325 Unspecified lump in the left breast, overlapping quadrants: Secondary | ICD-10-CM

## 2023-06-23 ENCOUNTER — Ambulatory Visit
Admission: RE | Admit: 2023-06-23 | Discharge: 2023-06-23 | Disposition: A | Discharge status: Home / Self Care | Source: Ambulatory Visit | Attending: Family Medicine | Admitting: Family Medicine

## 2023-06-23 ENCOUNTER — Other Ambulatory Visit: Payer: Self-pay | Admitting: Family Medicine

## 2023-06-23 DIAGNOSIS — N6325 Unspecified lump in the left breast, overlapping quadrants: Secondary | ICD-10-CM

## 2023-06-23 DIAGNOSIS — N6320 Unspecified lump in the left breast, unspecified quadrant: Secondary | ICD-10-CM

## 2023-06-23 DIAGNOSIS — N6321 Unspecified lump in the left breast, upper outer quadrant: Secondary | ICD-10-CM | POA: Diagnosis not present

## 2023-06-30 ENCOUNTER — Ambulatory Visit
Admission: RE | Admit: 2023-06-30 | Discharge: 2023-06-30 | Disposition: A | Discharge status: Home / Self Care | Source: Ambulatory Visit | Attending: Family Medicine | Admitting: Family Medicine

## 2023-06-30 DIAGNOSIS — R92322 Mammographic fibroglandular density, left breast: Secondary | ICD-10-CM | POA: Diagnosis not present

## 2023-06-30 DIAGNOSIS — C50412 Malignant neoplasm of upper-outer quadrant of left female breast: Secondary | ICD-10-CM | POA: Diagnosis not present

## 2023-06-30 DIAGNOSIS — N6321 Unspecified lump in the left breast, upper outer quadrant: Secondary | ICD-10-CM | POA: Diagnosis not present

## 2023-06-30 DIAGNOSIS — N6320 Unspecified lump in the left breast, unspecified quadrant: Secondary | ICD-10-CM

## 2023-06-30 HISTORY — PX: BREAST BIOPSY: SHX20

## 2023-07-01 LAB — SURGICAL PATHOLOGY

## 2023-07-04 DIAGNOSIS — E119 Type 2 diabetes mellitus without complications: Secondary | ICD-10-CM | POA: Diagnosis not present

## 2023-07-04 DIAGNOSIS — H401132 Primary open-angle glaucoma, bilateral, moderate stage: Secondary | ICD-10-CM | POA: Diagnosis not present

## 2023-07-04 DIAGNOSIS — H47233 Glaucomatous optic atrophy, bilateral: Secondary | ICD-10-CM | POA: Diagnosis not present

## 2023-07-12 ENCOUNTER — Telehealth: Payer: Self-pay | Admitting: *Deleted

## 2023-07-12 DIAGNOSIS — Z170 Estrogen receptor positive status [ER+]: Secondary | ICD-10-CM | POA: Diagnosis not present

## 2023-07-12 DIAGNOSIS — C50412 Malignant neoplasm of upper-outer quadrant of left female breast: Secondary | ICD-10-CM | POA: Diagnosis not present

## 2023-07-12 NOTE — Telephone Encounter (Signed)
   Pre-operative Risk Assessment    Patient Name: Ann Hunter  DOB: September 30, 1945 MRN: [id number]   Date of last office visit: 08/06/22 DR. CRENSHAW Date of next office visit: 07/18/23 Marjie Skiff, [facility]   Request for Surgical Clearance    Procedure:   LEFT NEEDLE LOCALIZED LUMPECTOMY/PARTIAL MASTECTOMY WITH SENTINEL LYMPH NODES Bx  Date of Surgery:  Clearance 07/28/23                                Surgeon:  DR. Georgiana Shore Surgeon's Group or Practice Name:  [facility] Phone number:  [phone] Fax number:  [phone]   Type of Clearance Requested:   - Medical ; NONE INDICATED TO BE HELD PER FORM   Type of Anesthesia:  General    Additional requests/questions:    Elpidio Anis   07/12/2023, 1:47 PM

## 2023-07-12 NOTE — Telephone Encounter (Signed)
   Name: Samamtha Tiegs  DOB: 18-Aug-1945  MRN: [id number]  Primary Cardiologist: Olga Millers, MD  Chart reviewed as part of pre-operative protocol coverage. Because of Terilynn Buresh past medical history and time since last visit, she will require a follow-up in-office visit in order to better assess preoperative cardiovascular risk.  Pre-op covering staff: - Please schedule appointment and call patient to inform them. If patient already had an upcoming appointment within acceptable timeframe, please add "pre-op clearance" to the appointment notes so provider is aware. - Please contact requesting surgeon's office via preferred method (i.e, phone, fax) to inform them of need for appointment prior to surgery.  No medications indicated as needing held.   Sharlene Dory, PA-C  07/12/2023, 2:05 PM

## 2023-07-12 NOTE — Telephone Encounter (Signed)
 Updated appt notes for preop clearance.

## 2023-07-12 NOTE — Progress Notes (Addendum)
 Cardiology Office Note:    Date:  07/18/2023   ID:  Ann Hunter, DOB Jan 30, 1946, MRN [id number]  PCP:  Annette Barters, MD  Cardiologist:  Alexandria Angel, MD     Referring MD: Annette Barters, MD   Chief Complaint: pre-op evaluation  History of Present Illness:    Ann Hunter is a 78 y.o. female with a history of non-obstructive CAD on cardiac catheterization in 08/2015, chronic diastolic CHF, isolated episode of atrial fibrillation in setting of urosepsis in 2012 (not on anticoagulation), pulmonary hypertension, hypertension, hyperlipidemia, type 2 diabetes mellitus, GERD, and bladder cancer who is followed by Dr. Audery Blazing and presents today for pre-op evaluation.   Patient has a history of remote atrial fibrillation in 2012 in setting of urosepsis. She was briefly on Coumadin but this was ultimately stopped due to no recurrence and low CHA2DS-VASc  score of 1 at the time. She has not had any documented recurrence since then so remains off anticoagulation. She has a long history of dyspnea. CPX in 06/2015 showed moderate to severe functional impairment, mild obstructive/ restrictive lung patterns on pre-exercise spirometry but no evidence of exercise-induced bronchospasms. There were several variables suggesting diastolic dysfunction and pulmonary hypertension were likely the primary causes of patient's symptoms. R/LHC in 08/2015 showed mild non-obstructive CAD with moderate pulmonary hypertension and normal LV filling pressures. Echo in 01/2016 showed LVEF of 55-60% with grade 2 diastolic dysfunction. Repeat RHC at [facility] in 02/2016 showed RA 10 mmHg, Mean PA 50 mmHg, and PCWP 26 mmHg. She had extensive pulmonary work-up around that time as well with VQ scan and PFTs. Dyspnea was ultimately felt to be due to chronic diastolic CHF.   She was last seen by Dr. Audery Blazing in 07/2022 at which time she was doing well from a cardiac standpoint with no chest pain, dyspnea, or edema.  Patient was  recently diagnosed with breast cancer and is in need of localized lumpectomy/ partial mastectomy with sentinel lymph node biopsy. She presents today for pre-op evaluation. She is here with her husband. She is stable from a cardiac standpoint. She has chronic dyspnea on exertion and orthopnea but this is unchanged from baseline. No PND or edema. No chest pain, palpitations, lightheadedness, dizziness, or syncope. Her activity is limited by unsteadiness on her feet and generalized weakness which husband says is not new and is stable. However, she is able to walk around her house without any issues and do light activities around the house. She is able to complete 4.06 METS of physical activity without any anginal symptoms.   EKGs/Labs/Other Studies Reviewed:    The following studies were reviewed today:  Cardiopulmonary Exercise Test 06/18/2015: Conclusion: Exercise testing with gas exchange demonstrates  moderate to severe functional impairment when compared to matched  sedentary norms. Pre-exercise spirometry suggests mild  obstructive/restrictive lung patterns with no evidence of  exercise-induced bronchospasm. Patient appears to have strong  evidence of circulatory limitation. Several variables suggests  diastolic dysfunction and pulmonary hypertension are likely  primary causes of patient's limitations. Further evaluation  recommended.  _______________  Right/ Left Cardiac Catheterization 08/13/2015: Prox RCA to Mid RCA lesion, 10% stenosed. Prox LAD to Mid LAD lesion, 30% stenosed. The left ventricular systolic function is normal.   1. Nonobstructive CAD 2. Normal LV function 3. Moderate pulmonary HTN with normal LV filling pressures.   Plan: medical management.  _______________  Echocardiogram 01/20/2016: Study Conclusions: - Left ventricle: The cavity size was normal. Wall thickness was  increased in a pattern of moderate LVH. Systolic function was    normal. The estimated  ejection fraction was in the range of 55%    to 60%. Wall motion was normal; there were no regional wall    motion abnormalities. Features are consistent with a pseudonormal    left ventricular filling pattern, with concomitant abnormal    relaxation and increased filling pressure (grade 2 diastolic    dysfunction). Doppler parameters are consistent with high    ventricular filling pressure.  - Mitral valve: Calcified annulus.  - Left atrium: The atrium was mildly dilated.   Impressions: - Normal LV systolic function; grade 2 diastolic dysfunction wtih    elevated LV filling pressure; mild LAE; trace MR and TR.   EKG:  EKG ordered today. EKG personally reviewed and shows sinus bradycardia, rate 56 bpm, with slight ST depressions in inferior lateral leads. Reviewed EKG with Dr. Alvis Ba (DOD) - she has had subtle changes in these leads before but slightly more prominent on tracing today.  Recent Labs: 08/06/2022: ALT 11; BUN 30; Creatinine, Ser 1.41; Potassium 5.3; Sodium 136  Recent Lipid Panel    Component Value Date/Time   CHOL 182 08/06/2022 1058   TRIG 123 08/06/2022 1058   HDL 46 08/06/2022 1058   CHOLHDL 4.0 08/06/2022 1058   LDLCALC 114 (H) 08/06/2022 1058    Physical Exam:    Vital Signs: BP (!) 112/50 (BP Location: Right Arm, Patient Position: Sitting, Cuff Size: Normal)   Pulse (!) 56   Ht 5\' 4"  (1.626 m)   Wt 121 lb 3.2 oz (55 kg)   SpO2 95%   BMI 20.80 kg/m     Wt Readings from Last 3 Encounters:  07/18/23 121 lb 3.2 oz (55 kg)  08/06/22 124 lb 12.8 oz (56.6 kg)  05/12/21 136 lb 6.4 oz (61.9 kg)     General: 78 y.o. Caucasian female in no acute distress. HEENT: Normocephalic and atraumatic. Sclera clear.  Neck: Supple. No carotid bruits. No JVD. Heart:  Mildly bradycardic with normal rate. III/VI systolic murmur. No rubs or gallops.. Lungs: No increased work of breathing. Clear to ausculation bilaterally. No wheezes, rhonchi, or rales.  Extremities: No  lower extremity edema.   Skin: Warm and dry. Neuro: No focal deficits. Psych: Normal affect. Responds appropriately.  Assessment:    1. Preop cardiovascular exam   2. Mild non-obstructive CAD   3. Chronic diastolic CHF (congestive heart failure) (HCC)   4. Pulmonary hypertension, unspecified (HCC)   5. Remote atrial fibrillation   6. Systolic murmur   7. Primary hypertension   8. Hyperlipidemia, unspecified hyperlipidemia type   9. Type 2 diabetes mellitus with complication, without long-term current use of insulin (HCC)   10. Stage 3b chronic kidney disease (HCC)     Plan:    Pre-Op Evaluation Patient is in need of localized lumpectomy/ partial mastectomy with sentinel lymph node biopsy for treatment of breast cancer. She is stable from a cardiac standpoint with no chest pain, significant shortness of breath, acute CHF symptoms, palpitations, or syncope. She is able to complete 4.06 METS of physical activity. She does have a systolic murmur on exam which appears to be new base on chart review. Her surgery is scheduled for 07/28/2023. We were able to get her scheduled for an Echo on 07/26/2023; therefore I will wait to provide final pre-op risk assessment until we have these results. However, I suspect that she will be at acceptable risk to proceed with  surgery.  Therefore, based on ACC/AHA guidelines, patient would be at acceptable risk for the planned procedure without further cardiovascular testing. I will route this recommendation to the requesting party via Epic fax function.   Mild Non-Obstructive CAD Noted on cardiac catheterization in 08/2015. - No chest pain.  - Continue statin.  - She does not appear to be on Aspirin. Did not discuss this in office today. Can discuss starting Aspirin 81mg  daily after surgery when we call with her Echo results.   Chronic Diastolic CHF Pulmonary Hypertension Last Echo in 01/2016 showed LVEF of 55-60% with grade 2 diastolic dysfunction. RHC at  [facility] in 02/2016 showed RA 10 mmHg, Mean PA 50 mmHg, and PCWP 26 mmHg.  - Euvolemic on exam.  - She is not any diuretics at home and does not require any at this time.  Remote Atrial Fibrillation Patient has an isolated history of atrial fibrillation in 2012 in setting of urosepsis. She was briefly on Coumadin but this was stopped due to no recurrence and low CHA2DS-VASc score at the time.  - Maintaining sinus rhythm.  - Continue Coreg 25mg  twice daily.  - CHA2DS-VASc = 7 (CAD, CHF, HTN, DM, age x2, female). If patient has any documented recurrence of atrial fibrillation, will need anticoagulation.   Systolic Murmur Systolic murmur noted on Echo. This appears to be new. - Will order Echo.  Hypertension BP well controlled.  - Continue current medications: Coreg 25mg  twice daily, Losartan 50mg  daily, and Hydralazine 100mg  twice daily.   Hyperlipidemia Lipid panel in 08/2023: Total Cholesterol 106, Triglycerides 125, HDL 27, LDL 56. LDL goal <70 given CAD. - Continue Pravastatin 40mg  daily.  Type 2 Diabetes Mellitus - On Metformin and Glimepiride.  - Management per PCP.  CKD Stage IIIb Baseline creatinine around 1.6 to 1.7. Stable at 1.7 on most recent labs in 04/2023 at PCP's office.   Disposition: Follow up in 1 year.    Signed, Casimer Clear, PA-C  07/18/2023 11:50 AM    [facility]  ADDENDUM 07/26/2023: Echo showed LVEF of 60-65% with no regional wall motion and moderate LVH, normal RV function, and mild aortic stenosis (mean gradient 10.5 mmHg). No new recommendations. Patient okay to proceed with planned surgery as above. I will route this recommendation to the requesting party via Epic fax function.    Zayne Draheim E Issaic Welliver, PA-C 07/26/2023 5:27 PM

## 2023-07-18 ENCOUNTER — Ambulatory Visit: Attending: Student | Admitting: Student

## 2023-07-18 ENCOUNTER — Encounter: Payer: Self-pay | Admitting: Student

## 2023-07-18 VITALS — BP 112/50 | HR 56 | Ht 64.0 in | Wt 121.2 lb

## 2023-07-18 DIAGNOSIS — Z01810 Encounter for preprocedural cardiovascular examination: Secondary | ICD-10-CM | POA: Diagnosis not present

## 2023-07-18 DIAGNOSIS — I480 Paroxysmal atrial fibrillation: Secondary | ICD-10-CM

## 2023-07-18 DIAGNOSIS — N183 Chronic kidney disease, stage 3 unspecified: Secondary | ICD-10-CM | POA: Insufficient documentation

## 2023-07-18 DIAGNOSIS — E118 Type 2 diabetes mellitus with unspecified complications: Secondary | ICD-10-CM

## 2023-07-18 DIAGNOSIS — I272 Pulmonary hypertension, unspecified: Secondary | ICD-10-CM

## 2023-07-18 DIAGNOSIS — R011 Cardiac murmur, unspecified: Secondary | ICD-10-CM

## 2023-07-18 DIAGNOSIS — E785 Hyperlipidemia, unspecified: Secondary | ICD-10-CM | POA: Diagnosis not present

## 2023-07-18 DIAGNOSIS — I5032 Chronic diastolic (congestive) heart failure: Secondary | ICD-10-CM | POA: Diagnosis not present

## 2023-07-18 DIAGNOSIS — N1832 Chronic kidney disease, stage 3b: Secondary | ICD-10-CM | POA: Diagnosis not present

## 2023-07-18 DIAGNOSIS — I10 Essential (primary) hypertension: Secondary | ICD-10-CM

## 2023-07-18 DIAGNOSIS — I2510 Atherosclerotic heart disease of native coronary artery without angina pectoris: Secondary | ICD-10-CM

## 2023-07-18 NOTE — Patient Instructions (Signed)
 Medication Instructions:  The current medical regimen is effective;  continue present plan and medications as directed. Please refer to the Current Medication list given to you today.  *If you need a refill on your cardiac medications before your next appointment, please call your pharmacy*  Lab Work: NONE If you have labs (blood work) drawn today and your tests are completely normal, you will receive your results only by:  1-MyChart Message (if you have MyChart) OR 2-A paper copy in the mail If you have any lab test that is abnormal or we need to change your treatment, we will call you to review the results.  Testing/Procedures: Your physician has requested that you have an echocardiogram-AT [facility] OFF[facility]-SEE ATTACHED. Echocardiography is a painless test that uses sound waves to create images of your heart. It provides your doctor with information about the size and shape of your heart and how well your heart's chambers and valves are working. This procedure takes approximately one hour. There are no restrictions for this procedure. Please do NOT wear cologne, perfume, aftershave, or lotions (deodorant is allowed). Please arrive 15 minutes prior to your appointment time.  Please note: We ask at that you not bring children with you during ultrasound (echo/ vascular) testing. Due to room size and safety concerns, children are not allowed in the ultrasound rooms during exams. Our front office staff cannot provide observation of children in our lobby area while testing is being conducted. An adult accompanying a patient to their appointment will only be allowed in the ultrasound room at the discretion of the ultrasound technician under special circumstances. We apologize for any inconvenience.   Follow-Up: At [facility], you and your health needs are our priority.  As part of our continuing mission to provide you with exceptional heart care, our providers are all part of one team.  This  team includes your primary Cardiologist (physician) and Advanced Practice Providers or APPs (Physician Assistants and Nurse Practitioners) who all work together to provide you with the care you need, when you need it.  Your next appointment:   12 month(s)  Provider:   Olga Millers, MD    We recommend signing up for the patient portal called "MyChart".  Sign up information is provided on this After Visit Summary.  MyChart is used to connect with patients for Virtual Visits (Telemedicine).  Patients are able to view lab/test results, encounter notes, upcoming appointments, etc.  Non-urgent messages can be sent to your provider as well.   To learn more about what you can do with MyChart, go to [email or web address].   Other Instructions       1st Floor: - Lobby - Registration  - Pharmacy  - Lab - Cafe  2nd Floor: - PV Lab - Diagnostic Testing (echo, CT, nuclear med)  3rd Floor: - Vacant  4th Floor: - TCTS (cardiothoracic surgery) - AFib Clinic - Structural Heart Clinic - Vascular Surgery  - Vascular Ultrasound  5th Floor: - HeartCare Cardiology (general and EP) - Clinical Pharmacy for coumadin, hypertension, lipid, weight-loss medications, and med management appointments    Valet parking services will be available as well.

## 2023-07-26 ENCOUNTER — Ambulatory Visit (HOSPITAL_BASED_OUTPATIENT_CLINIC_OR_DEPARTMENT_OTHER)
Admission: RE | Admit: 2023-07-26 | Discharge: 2023-07-26 | Disposition: A | Discharge status: Home / Self Care | Source: Ambulatory Visit | Attending: Student | Admitting: Student

## 2023-07-26 DIAGNOSIS — R011 Cardiac murmur, unspecified: Secondary | ICD-10-CM | POA: Insufficient documentation

## 2023-07-26 LAB — ECHOCARDIOGRAM COMPLETE
AR max vel: 1.51 cm2
AV Area VTI: 1.55 cm2
AV Area mean vel: 1.53 cm2
AV Mean grad: 10.5 mmHg
AV Peak grad: 19.6 mmHg
Ao pk vel: 2.21 m/s
Area-P 1/2: 3.19 cm2
Calc EF: 65.6 %
MV M vel: 4.5 m/s
MV Peak grad: 81 mmHg
S' Lateral: 2.3 cm
Single Plane A2C EF: 64.2 %
Single Plane A4C EF: 64.7 %

## 2023-07-28 DIAGNOSIS — I2510 Atherosclerotic heart disease of native coronary artery without angina pectoris: Secondary | ICD-10-CM | POA: Diagnosis not present

## 2023-07-28 DIAGNOSIS — I5032 Chronic diastolic (congestive) heart failure: Secondary | ICD-10-CM | POA: Diagnosis not present

## 2023-07-28 DIAGNOSIS — R011 Cardiac murmur, unspecified: Secondary | ICD-10-CM | POA: Diagnosis not present

## 2023-07-28 DIAGNOSIS — C773 Secondary and unspecified malignant neoplasm of axilla and upper limb lymph nodes: Secondary | ICD-10-CM | POA: Diagnosis not present

## 2023-07-28 DIAGNOSIS — I272 Pulmonary hypertension, unspecified: Secondary | ICD-10-CM | POA: Diagnosis not present

## 2023-07-28 DIAGNOSIS — I130 Hypertensive heart and chronic kidney disease with heart failure and stage 1 through stage 4 chronic kidney disease, or unspecified chronic kidney disease: Secondary | ICD-10-CM | POA: Diagnosis not present

## 2023-07-28 DIAGNOSIS — C50912 Malignant neoplasm of unspecified site of left female breast: Secondary | ICD-10-CM | POA: Diagnosis not present

## 2023-07-28 DIAGNOSIS — K219 Gastro-esophageal reflux disease without esophagitis: Secondary | ICD-10-CM | POA: Diagnosis not present

## 2023-07-28 DIAGNOSIS — Z170 Estrogen receptor positive status [ER+]: Secondary | ICD-10-CM | POA: Diagnosis not present

## 2023-07-28 DIAGNOSIS — Z7984 Long term (current) use of oral hypoglycemic drugs: Secondary | ICD-10-CM | POA: Diagnosis not present

## 2023-07-28 DIAGNOSIS — C50412 Malignant neoplasm of upper-outer quadrant of left female breast: Secondary | ICD-10-CM | POA: Diagnosis not present

## 2023-07-28 DIAGNOSIS — Z79899 Other long term (current) drug therapy: Secondary | ICD-10-CM | POA: Diagnosis not present

## 2023-07-28 DIAGNOSIS — N1832 Chronic kidney disease, stage 3b: Secondary | ICD-10-CM | POA: Diagnosis not present

## 2023-07-28 DIAGNOSIS — E782 Mixed hyperlipidemia: Secondary | ICD-10-CM | POA: Diagnosis not present

## 2023-07-28 DIAGNOSIS — M810 Age-related osteoporosis without current pathological fracture: Secondary | ICD-10-CM | POA: Diagnosis not present

## 2023-07-28 DIAGNOSIS — I10 Essential (primary) hypertension: Secondary | ICD-10-CM | POA: Diagnosis not present

## 2023-07-28 DIAGNOSIS — T80211A Bloodstream infection due to central venous catheter, initial encounter: Secondary | ICD-10-CM | POA: Diagnosis not present

## 2023-07-28 DIAGNOSIS — E1122 Type 2 diabetes mellitus with diabetic chronic kidney disease: Secondary | ICD-10-CM | POA: Diagnosis not present

## 2023-08-04 DIAGNOSIS — C50912 Malignant neoplasm of unspecified site of left female breast: Secondary | ICD-10-CM | POA: Diagnosis not present

## 2023-08-04 DIAGNOSIS — N1832 Chronic kidney disease, stage 3b: Secondary | ICD-10-CM | POA: Diagnosis not present

## 2023-08-04 DIAGNOSIS — I272 Pulmonary hypertension, unspecified: Secondary | ICD-10-CM | POA: Diagnosis not present

## 2023-08-04 DIAGNOSIS — M810 Age-related osteoporosis without current pathological fracture: Secondary | ICD-10-CM | POA: Diagnosis not present

## 2023-08-04 DIAGNOSIS — E1129 Type 2 diabetes mellitus with other diabetic kidney complication: Secondary | ICD-10-CM | POA: Diagnosis not present

## 2023-08-04 DIAGNOSIS — R809 Proteinuria, unspecified: Secondary | ICD-10-CM | POA: Diagnosis not present

## 2023-08-04 DIAGNOSIS — E7800 Pure hypercholesterolemia, unspecified: Secondary | ICD-10-CM | POA: Diagnosis not present

## 2023-08-04 DIAGNOSIS — I10 Essential (primary) hypertension: Secondary | ICD-10-CM | POA: Diagnosis not present

## 2023-08-04 DIAGNOSIS — I4891 Unspecified atrial fibrillation: Secondary | ICD-10-CM | POA: Diagnosis not present

## 2023-08-04 DIAGNOSIS — I5032 Chronic diastolic (congestive) heart failure: Secondary | ICD-10-CM | POA: Diagnosis not present

## 2023-08-10 NOTE — Progress Notes (Signed)
 Phone contact with pt's husband who reports that she is doing well after her surgery. Navigation contact info given to spouse who is also pt's power of attorney. Encouraged to have pt call with questions or concerns. Pt is scheduled withDr. Almer Jacobson for 08/19/23.

## 2023-08-18 DIAGNOSIS — C50412 Malignant neoplasm of upper-outer quadrant of left female breast: Secondary | ICD-10-CM | POA: Insufficient documentation

## 2023-08-18 NOTE — Progress Notes (Signed)
 Ann Hunter  [address] [place],  [place]  [address] [phone]  Clinic Day: 08/19/23  Referring physician: Lenita Hunter., MD  ASSESSMENT & PLAN:  Assessment: Stage IIA Left Breast Cancer She has had a lumpectomy and does not require radiation. I do not feel she is an appropriate candidate fr chemotherapy in view of her age, frailty, performance status, and comorbidities. We therefore will not pursue Oncotype DX testing despite her 1 positive node. I recommended hormonal therapy and discussed the risks and benefits of tamoxifen versus an aromatase inhibitor. She is already at high risk for thrombosis with her sedentary lifestyle so they found neither option acceptable. An AI would potentially worsen her osteoporosis and she cannot pursue aggressive treatment of this with her history of ONJ. I therefore feel the best option is Raloxifene , which would lower her risk of breast cancer while also providing benefit to her osteoporosis.   Osteoporosis She has been on Fosamax before but apparently this was severe enough to warrant Prolia injections and she developed osteonecrosis of the jaw. She is therefore not a good candidate for Reclast either but Raloxifene  might be the best alternative. Her bone density scans were done over 10 years ago so I will get a new baseline DEXA now.   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   Bladder Cancer I don't have those records but she has had multiple tumor resections and BCG so this must've been fairly advanced. She is currently on surveillance through a urology group in [facility].   Plan: We reviewed her medical history in detail and I met with her and her husband, who is her healthcare POA. She does not have a Living will and they will discuss this further in the future. I did discuss advanced directives  and offered to have our Child psychotherapist meet with them. She uses a wheelchair only outside of home and spends most of her time being sedentary and taking "cat naps". Her husband describes her as sleeping more than half of the day. She previously had a bone density scan done years ago at [facility] and was found to have osteoporosis. Patient was started on Prolia injections but was discontinued off of this due to the development of osteonecrosis of the jaw. I will order a repeat bone density scan for her today. Patient informed me that she previously had bladder cancer and has been treat with TURBT and intravesicular BCG though a urologist in [facility]. She notes taking Invokana but this was removed off the market due to an increase in cancer cases. I reviewed her pathology and explained the difference in stages with her having a positive lymph node for a T1c  N1a M0, stage IIA. Due to her tumor being ER positive, HER2 negative, and Ki-67 of 25% that this is to her benefit as her tumor is hormonal sensitive and slow growing. I don't think further staging tests are necessary considering her frail condition and many comorbidities. Due to her multiple comorbidities I also do not think chemotherapy is a good option for her. However, hormonal therapy is our best option and she would need to take 1 pill daily for 5 years total. I informed her and her husband about Tamoxifen and its side effects such as blood clots and uterine cancer. I also informed them of Anastrozole and the potential side  effects such as aching of the muscles, osteoporosis, hot flashes, and mood swings. I am not keen to use either of these options due to her medical history however, a safer option could be Raloxifene . This is not the optimal treatment but it  have the additional benefit of treating her bones and lesser toxicities. I informed them that this has some similar but milder side effects. I described to her the symptoms of a blood clot and  to contact my office immediately if she believes she has developed one. She and her husband have agreed to try Raloxifene  and I will prescribe that today. I will see her back in 3 months for reevaluation. I discussed the assessment and treatment plan with the patient.  The patient was provided an opportunity to ask questions and all were answered.  The patient agreed with the plan and demonstrated an understanding of the instructions.  The patient was advised to call back if the symptoms worsen or if the condition fails to improve as anticipated.  Thank you for the opportunity to care for your patients.   I provided 57 minutes of face-to-face time during this this encounter and > 50% was spent counseling as documented under my assessment and plan.   Ann Baumgartner, MD [facility] Ann [facility] [address] [place] [address] Dept: [phone] Dept Fax: [phone]   I, Ann Hunter, am acting as scribe for Ann Baumgartner, MD  I have reviewed this report as typed by the medical scribe, and it is complete and accurate.  Ann Baumgartner, MD   5/19/20257:01 AM  CHIEF COMPLAINT:  CC: Stage IIA left breast cancer T1cN1aM0  Current Treatment:  Raloxifene    HISTORY OF PRESENT ILLNESS:  Ann Hunter is a 78 y.o. female with a history of newly diagnosed breast cancer who is referred in consultation by Ann Hunter for assessment and management. Patient informed me that she palpated a knot in her left breast and had a diagnostic bilateral mammogram done on 06/23/2023. This revealed suspicious 1.9cm  left breast mass located in the upper outer quadrant. She was recommended a biopsy which was done on 06/30/2023.  Ann Hunter performed a localized lumpectomy/partial mastectomy done on 07/28/2023. 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 All margins were clear and this was found to be ER positive at 100%, PR negative, HER2 negative (1+), and a Ki-67 of 25%. She was referred to Oncology.   I have reviewed her chart and materials related to her cancer extensively and collaborated history with the patient. Summary of oncologic history is as follows: Oncology History  Breast cancer of upper-outer quadrant of left female breast (HCC)  07/28/2023 Cancer Staging   Staging form: Breast, AJCC 8th Edition - Clinical stage from 07/28/2023: Stage IIA (cT1c, cN1(sn), cM0, G2, ER+, PR-, HER2-) - Signed by Ann Baumgartner, MD on 08/18/2023 Histopathologic type: Infiltrating duct carcinoma, NOS Stage prefix: Initial diagnosis Method of lymph node assessment: Sentinel lymph node biopsy Nuclear grade: G2 Multigene prognostic tests performed: Oncotype DX Histologic grading system: 3 grade system Laterality: Left Tumor size (mm): 18 Lymph-vascular invasion (LVI): LVI not present (absent)/not identified Diagnostic confirmation: Positive histology Specimen type: Excision Staged by: Managing physician Menopausal status: Postmenopausal Ki-67 (%): 25 Stage used in treatment planning:  Yes National guidelines used in treatment planning: Yes Type of national guideline used in treatment planning: NCCN   08/18/2023 Initial Diagnosis   Breast cancer of upper-outer quadrant of left female breast (HCC)    INTERVAL HISTORY:  Ann Hunter is seen in the clinic for follow up of her newly diagnosed 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 We reviewed her medical history in detail and I met with her husband who is her healthcare POA. She does not have a living will and they will discuss this further in the future. I did discuss advanced directives and offered to have our Child psychotherapist meet with them. She uses a wheelchair only outside of  home and spends most of her time being sedentary and taking "cat naps". Her husband describes her as sleeping more than half of the day. She previously had a bone density scan done years ago at [facility] and was found to have osteoporosis. Patient was started on Prolia injections but was discontinued off of this due to the development of osteonecrosis of the jaw. I will order a repeat bone density scan for her today. Patient informed me that she was previously had bladder cancer and has been treat with TURBT and intervesicular BCG though a urologist in [facility]. She notes taking Invokana but this was removed off the market due to an increase in cancer cases. I reviewed her pathology and explained the difference in stages with her having a positive lymph node for a T1cN1aM0, stage IIA. Due to her tumor being ER positive, HER2 negative, and Ki-67 of 25% that this is in our benefit as her tumor is hormonal sensitive and slow growing. I don't think further staging tests are necessary considering her frail condition and many comorbidities. Due to her multiple comorbidities I also do not think chemotherapy is a good option for her. However, hormonal therapy is our best option and she would need to take 1 pill daily for 5 years total. I informed and her husband about Tamoxifen and its side effects such as blood clots and uterine cancer. I also informed them of Anastrozole and the potential side effects such as aching of the muscles, thinning of the bones, hot flashes, and mood swings. I am not keen to use either of these options due to her medical history however, a safer option can be Raloxifene . This is not the optimal treatment but would have the additional benefit of treating her bones and lesser toxicities. I informed them that this has some similar but milder side effects. I described to her the symptoms of a blood clot and to contact my office immediately if she believes she has developed one. She and her  husband have agreed to try Raloxifene  and I will prescribe that today. I will see her back in 3 months for reevaluation.    She denies fever, chills, night sweats, or other signs of infection. She denies cardiorespiratory and gastrointestinal issues. She denies pain. Her appetite is on and off. Her weight has decreased 3 pounds over last year.  HISTORY:   Past Medical History:  Diagnosis Date   Acute diastolic CHF (congestive heart failure) (HCC) 01/21/2016   B12 deficiency    Bladder cancer (HCC)    Closed head injury    Essential hypertension, benign    Head injury, unspecified    Memory difficulty    d/t head injury   Osteonecrosis due to drugs, jaw (HCC)    Osteoporosis, unspecified    Paroxysmal  atrial fibrillation (HCC)    during septic shock, none since   Pulmonary hypertension (HCC)    Pure hypercholesterolemia    Type II or unspecified type diabetes mellitus without mention of complication, uncontrolled    Unspecified vitamin D deficiency     Past Surgical History:  Procedure Laterality Date   APPENDECTOMY  04/13/1979   BREAST BIOPSY Left 06/30/2023   US  LT BREAST BX W LOC DEV 1ST LESION IMG BX SPEC US  GUIDE 06/30/2023 [facility] MAMMOGRAPHY   CARDIAC CATHETERIZATION     CARDIAC CATHETERIZATION N/A 08/13/2015   Procedure: Right/Left Heart Cath and Coronary Angiography;  Surgeon: Peter M Swaziland, MD;  Location: [facility] INVASIVE CV LAB;  Service: Cardiovascular;  Laterality: N/A;   MASTECTOMY PARTIAL / LUMPECTOMY Left    TONSILLECTOMY     TOTAL ABDOMINAL HYSTERECTOMY  04/13/1979    Family History  Problem Relation Age of Onset   Coronary artery disease Mother        MI   Heart disease Father        CHF   Diabetes Sister    Diabetes Sister    Thyroid  disease Sister    Arthritis Sister    Diabetes Sister   No family history of malignancy  Social History:  reports that she has never smoked. She has never used smokeless tobacco. She reports that she does not drink alcohol  and does not use drugs.The patient is accompanied by her husband today.  Allergies:  Allergies  Allergen Reactions   Advil [Ibuprofen] Swelling   Codeine Shortness Of Breath and Anaphylaxis    Throat Swelling  Throat Swelling,   Letairis  [Ambrisentan ] Shortness Of Breath   Levofloxacin Other (See Comments)   Oxybutynin Chloride Anaphylaxis and Other (See Comments)   Penicillins Swelling    Has patient had a PCN reaction causing immediate rash, facial/tongue/throat swelling, SOB or lightheadedness with hypotension: Yes  Has patient had a PCN reaction causing severe rash involving mucus membranes or skin necrosis: No  Has patient had a PCN reaction that required hospitalization No  Has patient had a PCN reaction occurring within the last 10 years: No  If all of the above answers are "NO", then may proceed with Cephalosporin use.  Has patient had a PCN reaction causing immediate rash, facial/tongue/throat swelling, SOB or lightheadedness with hypotension: Yes, Has patient had a PCN reaction causing severe rash involving mucus membranes or skin necrosis: No, Has patient had a PCN reaction that required hospitalization No, Has patient had a PCN reaction occurring within the last 10 years: No, If all of the above answers are "NO", then may proceed with Cephalosporin use., Has patient had a PCN reaction causing immediate rash, facial   Ranitidine Anaphylaxis   Sitagliptin Anaphylaxis and Swelling   Ace Inhibitors    Aleve [Naproxen Sodium]    Canagliflozin Other (See Comments)    Possible start of bladder cancer   Clonidine Derivatives    Fosamax [Alendronate]    Ibandronate Diarrhea and Swelling    Swelling  Tongue/throat swelling    Swelling   Januvia [Sitagliptin Phosphate]    Sulfa Antibiotics     Throat swells    Current Medications: Current Outpatient Medications  Medication Sig Dispense Refill   carvedilol  (COREG ) 25 MG tablet Take 25 mg by mouth 2 (two) times daily  with a meal.  1   glimepiride (AMARYL) 4 MG tablet Take 4 mg by mouth 2 (two) times daily.     hydrALAZINE  (APRESOLINE ) 100 MG tablet  TAKE 1 TABLET BY MOUTH TWICE A DAY (Patient taking differently: Takes 2 tablets twice a day) 180 tablet 2   losartan  (COZAAR ) 100 MG tablet Take 100 mg by mouth daily.     metFORMIN  (GLUCOPHAGE ) 500 MG tablet Take 500 mg by mouth 2 (two) times daily with a meal.  3   pravastatin  (PRAVACHOL ) 40 MG tablet Take 40 mg by mouth daily.     raloxifene  (EVISTA ) 60 MG tablet Take 1 tablet (60 mg total) by mouth daily. 30 tablet 5   No current facility-administered medications for this visit.    REVIEW OF SYSTEMS:  Review of Systems  Constitutional: Negative.  Negative for appetite change, chills, diaphoresis, fatigue, fever and unexpected weight change.  HENT:  Negative.  Negative for hearing loss, lump/mass, mouth sores, nosebleeds, sore throat, tinnitus, trouble swallowing and voice change.   Eyes: Negative.  Negative for eye problems and icterus.  Respiratory:  Positive for shortness of breath (with exertion). Negative for chest tightness, cough, hemoptysis and wheezing.        Orthopnea  Cardiovascular: Negative.  Negative for chest pain, leg swelling and palpitations.  Gastrointestinal: Negative.  Negative for abdominal distention, abdominal pain, blood in stool, constipation, diarrhea, nausea, rectal pain and vomiting.  Endocrine: Negative.   Genitourinary:  Positive for bladder incontinence. Negative for difficulty urinating, dyspareunia, dysuria, frequency, hematuria, menstrual problem, nocturia, pelvic pain, vaginal bleeding and vaginal discharge.   Musculoskeletal:  Negative for arthralgias, back pain, flank pain, gait problem, myalgias, neck pain and neck stiffness.  Skin: Negative.   Neurological:  Negative for dizziness, extremity weakness, gait problem, headaches, light-headedness, numbness, seizures and speech difficulty.  Hematological:  Negative for  adenopathy. Bruises/bleeds easily.  Psychiatric/Behavioral: Negative.  Negative for confusion, decreased concentration, depression, sleep disturbance and suicidal ideas. The patient is not nervous/anxious.     VITALS:   Blood pressure (!) 178/56, pulse 60, temperature 98 F (36.7 C), temperature source Oral, resp. rate 18, SpO2 95%.  Wt Readings from Last 3 Encounters:  07/18/23 121 lb 3.2 oz (55 kg)  08/06/22 124 lb 12.8 oz (56.6 kg)  05/12/21 136 lb 6.4 oz (61.9 kg)    There is no height or weight on file to calculate BMI.  Performance status (ECOG): 2 - Symptomatic, <50% confined to bed  PHYSICAL EXAM:  Physical Exam Vitals and nursing note reviewed. Exam conducted with a chaperone present.  Constitutional:      General: She is not in acute distress.    Appearance: Normal appearance. She is normal weight. She is not ill-appearing, toxic-appearing or diaphoretic.  HENT:     Head: Normocephalic and atraumatic.     Right Ear: Tympanic membrane, ear canal and external ear normal. There is no impacted cerumen.     Left Ear: Tympanic membrane, ear canal and external ear normal. There is no impacted cerumen.     Nose: Nose normal. No congestion or rhinorrhea.     Mouth/Throat:     Mouth: Mucous membranes are moist.     Pharynx: Oropharynx is clear. No oropharyngeal exudate or posterior oropharyngeal erythema.  Eyes:     General: No scleral icterus.       Right eye: No discharge.        Left eye: No discharge.     Extraocular Movements: Extraocular movements intact.     Conjunctiva/sclera: Conjunctivae normal.     Pupils: Pupils are equal, round, and reactive to light.  Neck:     Vascular: No carotid  bruit.  Cardiovascular:     Rate and Rhythm: Normal rate and regular rhythm.     Pulses: Normal pulses.     Heart sounds: Murmur heard.     Systolic murmur is present with a grade of 1/6.     No friction rub. No gallop.  Pulmonary:     Effort: Pulmonary effort is normal. No  respiratory distress.     Breath sounds: Normal breath sounds. No stridor. No wheezing, rhonchi or rales.  Chest:     Chest wall: No tenderness.     Comments: Seroma in the upper outer quadrant with healing incision Slight lymphedema of the medial left breast and mild ecchymosis.  Abdominal:     General: Bowel sounds are normal. There is no distension.     Palpations: Abdomen is soft. There is no hepatomegaly, splenomegaly or mass.     Tenderness: There is no abdominal tenderness. There is no right CVA tenderness, left CVA tenderness, guarding or rebound.     Hernia: No hernia is present.  Musculoskeletal:        General: No swelling, tenderness, deformity or signs of injury. Normal range of motion.     Cervical back: Normal range of motion and neck supple. No rigidity or tenderness.     Right lower leg: No edema.     Left lower leg: No edema.  Lymphadenopathy:     Cervical: No cervical adenopathy.     Right cervical: No superficial, deep or posterior cervical adenopathy.    Left cervical: No superficial, deep or posterior cervical adenopathy.     Upper Body:     Right upper body: No supraclavicular, axillary or pectoral adenopathy.     Left upper body: No supraclavicular, axillary or pectoral adenopathy.  Skin:    General: Skin is warm and dry.     Coloration: Skin is not jaundiced or pale.     Findings: No bruising, erythema, lesion or rash.  Neurological:     General: No focal deficit present.     Mental Status: She is alert and oriented to person, place, and time. Mental status is at baseline.     Cranial Nerves: No cranial nerve deficit.     Sensory: No sensory deficit.     Motor: No weakness.     Coordination: Coordination normal.     Gait: Gait normal.     Deep Tendon Reflexes: Reflexes normal.  Psychiatric:        Mood and Affect: Mood normal.        Behavior: Behavior normal.        Thought Content: Thought content normal.        Judgment: Judgment normal.    LABS:       Latest Ref Rng & Units 04/07/2017   11:59 AM 01/20/2016    2:22 AM 01/18/2016    8:52 AM  CBC  WBC 3.4 - 10.8 x10E3/uL 7.6  7.1  6.4   Hemoglobin 11.1 - 15.9 g/dL 09.8  9.7  9.6   Hematocrit 34.0 - 46.6 % 36.7  31.5  31.9   Platelets 150 - 379 x10E3/uL 261  273  251       Latest Ref Rng & Units 08/06/2022   10:58 AM 08/11/2020   11:11 AM 08/08/2020   11:06 AM  CMP  Glucose 70 - 99 mg/dL 119  147  829   BUN 8 - 27 mg/dL 30  25  25    Creatinine 0.57 - 1.00 mg/dL  1.41  1.28  1.24   Sodium 134 - 144 mmol/L 136  137  136   Potassium 3.5 - 5.2 mmol/L 5.3  5.3  5.8   Chloride 96 - 106 mmol/L 101  100  99   CO2 20 - 29 mmol/L 21  22  22    Calcium 8.7 - 10.3 mg/dL 16.1  9.9  09.6   Total Protein 6.0 - 8.5 g/dL 5.6   6.4   Total Bilirubin 0.0 - 1.2 mg/dL 0.4   0.4   Alkaline Phos 44 - 121 IU/L 60   43   AST 0 - 40 IU/L 13   11   ALT 0 - 32 IU/L 11   8    No results found for: "CEA1", "CEA" / No results found for: "CEA1", "CEA" No results found for: "PSA1" No results found for: "EAV409" No results found for: "CAN125"  No results found for: "TOTALPROTELP", "ALBUMINELP", "A1GS", "A2GS", "BETS", "BETA2SER", "GAMS", "MSPIKE", "SPEI" No results found for: "TIBC", "FERRITIN", "IRONPCTSAT" No results found for: "LDH"  STUDIES:  No results found.  Exam: 08/03/2023 Pathology:  Impression: Breast, lumpectomy, left breast mass Invasive ductal carcinoma, grade 2, 18mm Ribbon clip and biopsy reaction present Invasive tumor is 1mm from posterior margin Lymph node, sentinel, biopsy, left axillary #1 Tumor present in regional lymph node Lymph node, sentinel, biopsy, left axillary #2 Regional lymph node negative for tumor - All margins are clear  EXAM: 06/30/2023 ULTRASOUND GUIDED LEFT BREAST CORE NEEDLE BIOPSY ADDENDUM: Pathology revealed GRADE II INVASIVE MODERATELY DIFFERENTIATED DUCTAL ADENOCARCINOMA of the LEFT breast, 1.9 cm  EXAM: 06/23/2023 DIGITAL DIAGNOSTIC BILATERAL  MAMMOGRAM WITH TOMOSYNTHESIS AND CAD; ULTRASOUND LEFT BREAST LIMITED IMPRESSION: 1. Suspicious 1.9 cm UPPER-OUTER LEFT breast mass corresponding to the patient's palpable abnormality. Tissue sampling is recommended. No abnormal appearing LEFT axillary lymph nodes. RECOMMENDATION: Ultrasound-guided LEFT breast biopsy, which will be scheduled.   I,Ann M Hunter,acting as a scribe for Ann Baumgartner, MD.,have documented all relevant documentation on the behalf of Ann Baumgartner, MD,as directed by  Ann Baumgartner, MD while in the presence of Ann Baumgartner, MD.

## 2023-08-19 ENCOUNTER — Encounter: Payer: Self-pay | Admitting: Oncology

## 2023-08-19 ENCOUNTER — Other Ambulatory Visit: Payer: Self-pay | Admitting: Oncology

## 2023-08-19 ENCOUNTER — Inpatient Hospital Stay: Attending: Oncology | Admitting: Oncology

## 2023-08-19 VITALS — BP 178/56 | HR 60 | Temp 98.0°F | Resp 18

## 2023-08-19 DIAGNOSIS — Z923 Personal history of irradiation: Secondary | ICD-10-CM | POA: Diagnosis not present

## 2023-08-19 DIAGNOSIS — Z1722 Progesterone receptor negative status: Secondary | ICD-10-CM | POA: Diagnosis not present

## 2023-08-19 DIAGNOSIS — Z170 Estrogen receptor positive status [ER+]: Secondary | ICD-10-CM | POA: Diagnosis not present

## 2023-08-19 DIAGNOSIS — M87180 Osteonecrosis due to drugs, jaw: Secondary | ICD-10-CM | POA: Insufficient documentation

## 2023-08-19 DIAGNOSIS — Z1732 Human epidermal growth factor receptor 2 negative status: Secondary | ICD-10-CM | POA: Diagnosis not present

## 2023-08-19 DIAGNOSIS — Z79810 Long term (current) use of selective estrogen receptor modulators (SERMs): Secondary | ICD-10-CM | POA: Diagnosis not present

## 2023-08-19 DIAGNOSIS — C50412 Malignant neoplasm of upper-outer quadrant of left female breast: Secondary | ICD-10-CM

## 2023-08-19 DIAGNOSIS — Z8551 Personal history of malignant neoplasm of bladder: Secondary | ICD-10-CM | POA: Insufficient documentation

## 2023-08-19 DIAGNOSIS — M810 Age-related osteoporosis without current pathological fracture: Secondary | ICD-10-CM | POA: Insufficient documentation

## 2023-08-19 MED ORDER — RALOXIFENE HCL 60 MG PO TABS: 60.0000 mg | ORAL_TABLET | Freq: Every day | ORAL | 5 refills | Status: DC

## 2023-08-19 NOTE — Progress Notes (Signed)
 Face to face visit with pt and her husband in Med Onc exam room. Pt is here for her Med Onc consult with Dr. Almer Jacobson. Gave "The Breast Cancer Treatment Handbook" and discussed the Breast Cancer Support group. Navigation contact info given. Encouraged pt to call with questions or concerns.

## 2023-08-22 ENCOUNTER — Telehealth: Payer: Self-pay | Admitting: Oncology

## 2023-08-22 NOTE — Telephone Encounter (Signed)
 Patient has been scheduled for follow-up visit per 08/22/23 LOS.  Pt aware of scheduled appt details.

## 2023-08-28 ENCOUNTER — Other Ambulatory Visit: Payer: Self-pay | Admitting: Cardiology

## 2023-08-29 ENCOUNTER — Ambulatory Visit: Payer: PPO | Admitting: Cardiology

## 2023-09-01 ENCOUNTER — Other Ambulatory Visit: Payer: Self-pay

## 2023-09-01 NOTE — Progress Notes (Signed)
 This information is for the sake of discussion at Tumor Board only and not a part of the official treatment plan.

## 2023-09-02 ENCOUNTER — Ambulatory Visit (HOSPITAL_BASED_OUTPATIENT_CLINIC_OR_DEPARTMENT_OTHER)
Admission: RE | Admit: 2023-09-02 | Discharge: 2023-09-02 | Disposition: A | Discharge status: Home / Self Care | Source: Ambulatory Visit | Attending: Oncology | Admitting: Oncology

## 2023-09-02 DIAGNOSIS — M810 Age-related osteoporosis without current pathological fracture: Secondary | ICD-10-CM

## 2023-09-07 ENCOUNTER — Inpatient Hospital Stay (HOSPITAL_BASED_OUTPATIENT_CLINIC_OR_DEPARTMENT_OTHER): Admission: RE | Admit: 2023-09-07 | Source: Ambulatory Visit | Admitting: Radiology

## 2023-09-14 ENCOUNTER — Encounter (HOSPITAL_BASED_OUTPATIENT_CLINIC_OR_DEPARTMENT_OTHER): Payer: Self-pay

## 2023-09-14 ENCOUNTER — Inpatient Hospital Stay (HOSPITAL_BASED_OUTPATIENT_CLINIC_OR_DEPARTMENT_OTHER): Admission: RE | Admit: 2023-09-14 | Source: Ambulatory Visit | Admitting: Radiology

## 2023-11-05 DIAGNOSIS — B961 Klebsiella pneumoniae [K. pneumoniae] as the cause of diseases classified elsewhere: Secondary | ICD-10-CM | POA: Diagnosis not present

## 2023-11-05 DIAGNOSIS — Z881 Allergy status to other antibiotic agents status: Secondary | ICD-10-CM | POA: Diagnosis not present

## 2023-11-05 DIAGNOSIS — R069 Unspecified abnormalities of breathing: Secondary | ICD-10-CM | POA: Diagnosis not present

## 2023-11-05 DIAGNOSIS — R109 Unspecified abdominal pain: Secondary | ICD-10-CM | POA: Diagnosis not present

## 2023-11-05 DIAGNOSIS — R110 Nausea: Secondary | ICD-10-CM | POA: Diagnosis not present

## 2023-11-05 DIAGNOSIS — C50412 Malignant neoplasm of upper-outer quadrant of left female breast: Secondary | ICD-10-CM | POA: Diagnosis not present

## 2023-11-05 DIAGNOSIS — Z87442 Personal history of urinary calculi: Secondary | ICD-10-CM | POA: Diagnosis not present

## 2023-11-05 DIAGNOSIS — E11649 Type 2 diabetes mellitus with hypoglycemia without coma: Secondary | ICD-10-CM | POA: Diagnosis not present

## 2023-11-05 DIAGNOSIS — E7800 Pure hypercholesterolemia, unspecified: Secondary | ICD-10-CM | POA: Diagnosis not present

## 2023-11-05 DIAGNOSIS — E1165 Type 2 diabetes mellitus with hyperglycemia: Secondary | ICD-10-CM | POA: Diagnosis not present

## 2023-11-05 DIAGNOSIS — Z880 Allergy status to penicillin: Secondary | ICD-10-CM | POA: Diagnosis not present

## 2023-11-05 DIAGNOSIS — N179 Acute kidney failure, unspecified: Secondary | ICD-10-CM | POA: Diagnosis not present

## 2023-11-05 DIAGNOSIS — I110 Hypertensive heart disease with heart failure: Secondary | ICD-10-CM | POA: Diagnosis not present

## 2023-11-05 DIAGNOSIS — D1803 Hemangioma of intra-abdominal structures: Secondary | ICD-10-CM | POA: Diagnosis not present

## 2023-11-05 DIAGNOSIS — E872 Acidosis, unspecified: Secondary | ICD-10-CM | POA: Diagnosis not present

## 2023-11-05 DIAGNOSIS — R1110 Vomiting, unspecified: Secondary | ICD-10-CM | POA: Diagnosis not present

## 2023-11-05 DIAGNOSIS — I161 Hypertensive emergency: Secondary | ICD-10-CM | POA: Diagnosis not present

## 2023-11-05 DIAGNOSIS — N170 Acute kidney failure with tubular necrosis: Secondary | ICD-10-CM | POA: Diagnosis not present

## 2023-11-05 DIAGNOSIS — E860 Dehydration: Secondary | ICD-10-CM | POA: Diagnosis not present

## 2023-11-05 DIAGNOSIS — R9431 Abnormal electrocardiogram [ECG] [EKG]: Secondary | ICD-10-CM | POA: Diagnosis not present

## 2023-11-05 DIAGNOSIS — Z170 Estrogen receptor positive status [ER+]: Secondary | ICD-10-CM | POA: Diagnosis not present

## 2023-11-05 DIAGNOSIS — I5033 Acute on chronic diastolic (congestive) heart failure: Secondary | ICD-10-CM | POA: Diagnosis not present

## 2023-11-05 DIAGNOSIS — I2510 Atherosclerotic heart disease of native coronary artery without angina pectoris: Secondary | ICD-10-CM | POA: Diagnosis not present

## 2023-11-05 DIAGNOSIS — R531 Weakness: Secondary | ICD-10-CM | POA: Diagnosis not present

## 2023-11-05 DIAGNOSIS — Z743 Need for continuous supervision: Secondary | ICD-10-CM | POA: Diagnosis not present

## 2023-11-05 DIAGNOSIS — I509 Heart failure, unspecified: Secondary | ICD-10-CM | POA: Diagnosis not present

## 2023-11-05 DIAGNOSIS — J9601 Acute respiratory failure with hypoxia: Secondary | ICD-10-CM | POA: Diagnosis not present

## 2023-11-05 DIAGNOSIS — E875 Hyperkalemia: Secondary | ICD-10-CM | POA: Diagnosis not present

## 2023-11-05 DIAGNOSIS — R112 Nausea with vomiting, unspecified: Secondary | ICD-10-CM | POA: Diagnosis not present

## 2023-11-05 DIAGNOSIS — R0902 Hypoxemia: Secondary | ICD-10-CM | POA: Diagnosis not present

## 2023-11-05 DIAGNOSIS — N390 Urinary tract infection, site not specified: Secondary | ICD-10-CM | POA: Diagnosis not present

## 2023-11-05 DIAGNOSIS — D649 Anemia, unspecified: Secondary | ICD-10-CM | POA: Diagnosis not present

## 2023-11-05 DIAGNOSIS — Z885 Allergy status to narcotic agent status: Secondary | ICD-10-CM | POA: Diagnosis not present

## 2023-11-05 DIAGNOSIS — Z87440 Personal history of urinary (tract) infections: Secondary | ICD-10-CM | POA: Diagnosis not present

## 2023-11-05 DIAGNOSIS — J811 Chronic pulmonary edema: Secondary | ICD-10-CM | POA: Diagnosis not present

## 2023-11-15 DIAGNOSIS — E1122 Type 2 diabetes mellitus with diabetic chronic kidney disease: Secondary | ICD-10-CM | POA: Diagnosis not present

## 2023-11-15 DIAGNOSIS — J9601 Acute respiratory failure with hypoxia: Secondary | ICD-10-CM | POA: Diagnosis not present

## 2023-11-15 DIAGNOSIS — D631 Anemia in chronic kidney disease: Secondary | ICD-10-CM | POA: Diagnosis not present

## 2023-11-15 DIAGNOSIS — E872 Acidosis, unspecified: Secondary | ICD-10-CM | POA: Diagnosis not present

## 2023-11-15 DIAGNOSIS — N390 Urinary tract infection, site not specified: Secondary | ICD-10-CM | POA: Diagnosis not present

## 2023-11-15 DIAGNOSIS — E875 Hyperkalemia: Secondary | ICD-10-CM | POA: Diagnosis not present

## 2023-11-15 DIAGNOSIS — R0602 Shortness of breath: Secondary | ICD-10-CM | POA: Diagnosis not present

## 2023-11-15 DIAGNOSIS — E11649 Type 2 diabetes mellitus with hypoglycemia without coma: Secondary | ICD-10-CM | POA: Diagnosis not present

## 2023-11-15 DIAGNOSIS — C679 Malignant neoplasm of bladder, unspecified: Secondary | ICD-10-CM | POA: Diagnosis not present

## 2023-11-15 DIAGNOSIS — I272 Pulmonary hypertension, unspecified: Secondary | ICD-10-CM | POA: Diagnosis not present

## 2023-11-15 DIAGNOSIS — I161 Hypertensive emergency: Secondary | ICD-10-CM | POA: Diagnosis not present

## 2023-11-15 DIAGNOSIS — C779 Secondary and unspecified malignant neoplasm of lymph node, unspecified: Secondary | ICD-10-CM | POA: Diagnosis not present

## 2023-11-15 DIAGNOSIS — I509 Heart failure, unspecified: Secondary | ICD-10-CM | POA: Diagnosis not present

## 2023-11-15 DIAGNOSIS — B961 Klebsiella pneumoniae [K. pneumoniae] as the cause of diseases classified elsewhere: Secondary | ICD-10-CM | POA: Diagnosis not present

## 2023-11-15 DIAGNOSIS — N170 Acute kidney failure with tubular necrosis: Secondary | ICD-10-CM | POA: Diagnosis not present

## 2023-11-15 DIAGNOSIS — E1129 Type 2 diabetes mellitus with other diabetic kidney complication: Secondary | ICD-10-CM | POA: Diagnosis not present

## 2023-11-15 DIAGNOSIS — R2689 Other abnormalities of gait and mobility: Secondary | ICD-10-CM | POA: Diagnosis not present

## 2023-11-15 DIAGNOSIS — N1832 Chronic kidney disease, stage 3b: Secondary | ICD-10-CM | POA: Diagnosis not present

## 2023-11-15 DIAGNOSIS — R4189 Other symptoms and signs involving cognitive functions and awareness: Secondary | ICD-10-CM | POA: Diagnosis not present

## 2023-11-15 DIAGNOSIS — Z6820 Body mass index (BMI) 20.0-20.9, adult: Secondary | ICD-10-CM | POA: Diagnosis not present

## 2023-11-15 DIAGNOSIS — I2510 Atherosclerotic heart disease of native coronary artery without angina pectoris: Secondary | ICD-10-CM | POA: Diagnosis not present

## 2023-11-15 DIAGNOSIS — R5381 Other malaise: Secondary | ICD-10-CM | POA: Diagnosis not present

## 2023-11-15 DIAGNOSIS — J9690 Respiratory failure, unspecified, unspecified whether with hypoxia or hypercapnia: Secondary | ICD-10-CM | POA: Diagnosis not present

## 2023-11-15 DIAGNOSIS — E7800 Pure hypercholesterolemia, unspecified: Secondary | ICD-10-CM | POA: Diagnosis not present

## 2023-11-15 DIAGNOSIS — R531 Weakness: Secondary | ICD-10-CM | POA: Diagnosis not present

## 2023-11-15 DIAGNOSIS — N18 Chronic kidney disease (CKD): Secondary | ICD-10-CM | POA: Diagnosis not present

## 2023-11-15 DIAGNOSIS — I5033 Acute on chronic diastolic (congestive) heart failure: Secondary | ICD-10-CM | POA: Diagnosis not present

## 2023-11-15 DIAGNOSIS — E43 Unspecified severe protein-calorie malnutrition: Secondary | ICD-10-CM | POA: Diagnosis not present

## 2023-11-15 DIAGNOSIS — R627 Adult failure to thrive: Secondary | ICD-10-CM | POA: Diagnosis not present

## 2023-11-15 DIAGNOSIS — R2681 Unsteadiness on feet: Secondary | ICD-10-CM | POA: Diagnosis not present

## 2023-11-15 DIAGNOSIS — E1165 Type 2 diabetes mellitus with hyperglycemia: Secondary | ICD-10-CM | POA: Diagnosis not present

## 2023-11-15 DIAGNOSIS — C50412 Malignant neoplasm of upper-outer quadrant of left female breast: Secondary | ICD-10-CM | POA: Diagnosis not present

## 2023-11-15 DIAGNOSIS — I130 Hypertensive heart and chronic kidney disease with heart failure and stage 1 through stage 4 chronic kidney disease, or unspecified chronic kidney disease: Secondary | ICD-10-CM | POA: Diagnosis not present

## 2023-11-15 DIAGNOSIS — E873 Alkalosis: Secondary | ICD-10-CM | POA: Diagnosis not present

## 2023-11-17 DIAGNOSIS — E11649 Type 2 diabetes mellitus with hypoglycemia without coma: Secondary | ICD-10-CM | POA: Diagnosis not present

## 2023-11-17 DIAGNOSIS — R2689 Other abnormalities of gait and mobility: Secondary | ICD-10-CM | POA: Diagnosis not present

## 2023-11-17 DIAGNOSIS — C679 Malignant neoplasm of bladder, unspecified: Secondary | ICD-10-CM | POA: Diagnosis not present

## 2023-11-17 DIAGNOSIS — I491 Atrial premature depolarization: Secondary | ICD-10-CM | POA: Diagnosis not present

## 2023-11-17 DIAGNOSIS — J9690 Respiratory failure, unspecified, unspecified whether with hypoxia or hypercapnia: Secondary | ICD-10-CM | POA: Diagnosis not present

## 2023-11-17 DIAGNOSIS — I161 Hypertensive emergency: Secondary | ICD-10-CM | POA: Diagnosis not present

## 2023-11-17 DIAGNOSIS — D631 Anemia in chronic kidney disease: Secondary | ICD-10-CM | POA: Diagnosis not present

## 2023-11-17 DIAGNOSIS — C779 Secondary and unspecified malignant neoplasm of lymph node, unspecified: Secondary | ICD-10-CM | POA: Diagnosis not present

## 2023-11-17 DIAGNOSIS — N289 Disorder of kidney and ureter, unspecified: Secondary | ICD-10-CM | POA: Diagnosis not present

## 2023-11-17 DIAGNOSIS — R4189 Other symptoms and signs involving cognitive functions and awareness: Secondary | ICD-10-CM | POA: Diagnosis not present

## 2023-11-17 DIAGNOSIS — E1165 Type 2 diabetes mellitus with hyperglycemia: Secondary | ICD-10-CM | POA: Diagnosis not present

## 2023-11-17 DIAGNOSIS — N390 Urinary tract infection, site not specified: Secondary | ICD-10-CM | POA: Diagnosis not present

## 2023-11-17 DIAGNOSIS — N3000 Acute cystitis without hematuria: Secondary | ICD-10-CM | POA: Diagnosis not present

## 2023-11-17 DIAGNOSIS — I5033 Acute on chronic diastolic (congestive) heart failure: Secondary | ICD-10-CM | POA: Diagnosis not present

## 2023-11-17 DIAGNOSIS — C50412 Malignant neoplasm of upper-outer quadrant of left female breast: Secondary | ICD-10-CM | POA: Diagnosis not present

## 2023-11-17 DIAGNOSIS — N1832 Chronic kidney disease, stage 3b: Secondary | ICD-10-CM | POA: Diagnosis not present

## 2023-11-17 DIAGNOSIS — E43 Unspecified severe protein-calorie malnutrition: Secondary | ICD-10-CM | POA: Diagnosis not present

## 2023-11-17 DIAGNOSIS — Z170 Estrogen receptor positive status [ER+]: Secondary | ICD-10-CM | POA: Diagnosis not present

## 2023-11-17 DIAGNOSIS — R9431 Abnormal electrocardiogram [ECG] [EKG]: Secondary | ICD-10-CM | POA: Diagnosis not present

## 2023-11-17 DIAGNOSIS — I454 Nonspecific intraventricular block: Secondary | ICD-10-CM | POA: Diagnosis not present

## 2023-11-17 DIAGNOSIS — I509 Heart failure, unspecified: Secondary | ICD-10-CM | POA: Diagnosis not present

## 2023-11-17 DIAGNOSIS — R0602 Shortness of breath: Secondary | ICD-10-CM | POA: Diagnosis not present

## 2023-11-17 DIAGNOSIS — R739 Hyperglycemia, unspecified: Secondary | ICD-10-CM | POA: Diagnosis not present

## 2023-11-17 DIAGNOSIS — J90 Pleural effusion, not elsewhere classified: Secondary | ICD-10-CM | POA: Diagnosis not present

## 2023-11-17 DIAGNOSIS — Z6820 Body mass index (BMI) 20.0-20.9, adult: Secondary | ICD-10-CM | POA: Diagnosis not present

## 2023-11-17 DIAGNOSIS — I272 Pulmonary hypertension, unspecified: Secondary | ICD-10-CM | POA: Diagnosis not present

## 2023-11-17 DIAGNOSIS — D649 Anemia, unspecified: Secondary | ICD-10-CM | POA: Diagnosis not present

## 2023-11-17 DIAGNOSIS — I2510 Atherosclerotic heart disease of native coronary artery without angina pectoris: Secondary | ICD-10-CM | POA: Diagnosis not present

## 2023-11-17 DIAGNOSIS — R0902 Hypoxemia: Secondary | ICD-10-CM | POA: Diagnosis not present

## 2023-11-17 DIAGNOSIS — J811 Chronic pulmonary edema: Secondary | ICD-10-CM | POA: Diagnosis not present

## 2023-11-17 DIAGNOSIS — N170 Acute kidney failure with tubular necrosis: Secondary | ICD-10-CM | POA: Diagnosis not present

## 2023-11-17 DIAGNOSIS — E7800 Pure hypercholesterolemia, unspecified: Secondary | ICD-10-CM | POA: Diagnosis not present

## 2023-11-17 DIAGNOSIS — M6281 Muscle weakness (generalized): Secondary | ICD-10-CM | POA: Diagnosis not present

## 2023-11-17 DIAGNOSIS — E872 Acidosis, unspecified: Secondary | ICD-10-CM | POA: Diagnosis not present

## 2023-11-17 DIAGNOSIS — J189 Pneumonia, unspecified organism: Secondary | ICD-10-CM | POA: Diagnosis not present

## 2023-11-17 DIAGNOSIS — E1122 Type 2 diabetes mellitus with diabetic chronic kidney disease: Secondary | ICD-10-CM | POA: Diagnosis not present

## 2023-11-17 DIAGNOSIS — R5381 Other malaise: Secondary | ICD-10-CM | POA: Diagnosis not present

## 2023-11-17 DIAGNOSIS — R109 Unspecified abdominal pain: Secondary | ICD-10-CM | POA: Diagnosis not present

## 2023-11-17 DIAGNOSIS — B961 Klebsiella pneumoniae [K. pneumoniae] as the cause of diseases classified elsewhere: Secondary | ICD-10-CM | POA: Diagnosis not present

## 2023-11-17 DIAGNOSIS — R531 Weakness: Secondary | ICD-10-CM | POA: Diagnosis not present

## 2023-11-17 DIAGNOSIS — I130 Hypertensive heart and chronic kidney disease with heart failure and stage 1 through stage 4 chronic kidney disease, or unspecified chronic kidney disease: Secondary | ICD-10-CM | POA: Diagnosis not present

## 2023-11-17 DIAGNOSIS — E875 Hyperkalemia: Secondary | ICD-10-CM | POA: Diagnosis not present

## 2023-11-17 DIAGNOSIS — J9601 Acute respiratory failure with hypoxia: Secondary | ICD-10-CM | POA: Diagnosis not present

## 2023-11-17 DIAGNOSIS — A419 Sepsis, unspecified organism: Secondary | ICD-10-CM | POA: Diagnosis not present

## 2023-11-17 DIAGNOSIS — R627 Adult failure to thrive: Secondary | ICD-10-CM | POA: Diagnosis not present

## 2023-11-17 DIAGNOSIS — E873 Alkalosis: Secondary | ICD-10-CM | POA: Diagnosis not present

## 2023-11-18 ENCOUNTER — Inpatient Hospital Stay: Admitting: Oncology

## 2023-11-23 DIAGNOSIS — J189 Pneumonia, unspecified organism: Secondary | ICD-10-CM | POA: Diagnosis not present

## 2023-11-23 DIAGNOSIS — I509 Heart failure, unspecified: Secondary | ICD-10-CM | POA: Diagnosis not present

## 2023-11-25 DIAGNOSIS — R0602 Shortness of breath: Secondary | ICD-10-CM | POA: Diagnosis not present

## 2023-12-01 DIAGNOSIS — R0989 Other specified symptoms and signs involving the circulatory and respiratory systems: Secondary | ICD-10-CM | POA: Diagnosis not present

## 2023-12-02 DIAGNOSIS — J189 Pneumonia, unspecified organism: Secondary | ICD-10-CM | POA: Diagnosis not present

## 2023-12-02 DIAGNOSIS — E872 Acidosis, unspecified: Secondary | ICD-10-CM | POA: Diagnosis not present

## 2023-12-02 DIAGNOSIS — R531 Weakness: Secondary | ICD-10-CM | POA: Diagnosis not present

## 2023-12-02 DIAGNOSIS — Z743 Need for continuous supervision: Secondary | ICD-10-CM | POA: Diagnosis not present

## 2023-12-02 DIAGNOSIS — N390 Urinary tract infection, site not specified: Secondary | ICD-10-CM | POA: Diagnosis not present

## 2023-12-02 DIAGNOSIS — M6281 Muscle weakness (generalized): Secondary | ICD-10-CM | POA: Diagnosis not present

## 2023-12-02 DIAGNOSIS — Z170 Estrogen receptor positive status [ER+]: Secondary | ICD-10-CM | POA: Diagnosis not present

## 2023-12-02 DIAGNOSIS — D649 Anemia, unspecified: Secondary | ICD-10-CM | POA: Diagnosis not present

## 2023-12-06 DIAGNOSIS — N179 Acute kidney failure, unspecified: Secondary | ICD-10-CM | POA: Diagnosis not present

## 2023-12-06 DIAGNOSIS — R262 Difficulty in walking, not elsewhere classified: Secondary | ICD-10-CM | POA: Diagnosis not present

## 2023-12-06 DIAGNOSIS — N3000 Acute cystitis without hematuria: Secondary | ICD-10-CM | POA: Diagnosis not present

## 2023-12-06 DIAGNOSIS — R6889 Other general symptoms and signs: Secondary | ICD-10-CM | POA: Diagnosis not present

## 2023-12-06 DIAGNOSIS — J189 Pneumonia, unspecified organism: Secondary | ICD-10-CM | POA: Diagnosis not present

## 2023-12-06 DIAGNOSIS — N390 Urinary tract infection, site not specified: Secondary | ICD-10-CM | POA: Diagnosis not present

## 2023-12-07 DIAGNOSIS — I5023 Acute on chronic systolic (congestive) heart failure: Secondary | ICD-10-CM | POA: Diagnosis not present

## 2023-12-07 DIAGNOSIS — R718 Other abnormality of red blood cells: Secondary | ICD-10-CM | POA: Diagnosis not present

## 2023-12-07 DIAGNOSIS — Z13220 Encounter for screening for lipoid disorders: Secondary | ICD-10-CM | POA: Diagnosis not present

## 2023-12-09 DIAGNOSIS — J9690 Respiratory failure, unspecified, unspecified whether with hypoxia or hypercapnia: Secondary | ICD-10-CM | POA: Diagnosis not present

## 2023-12-09 DIAGNOSIS — N1832 Chronic kidney disease, stage 3b: Secondary | ICD-10-CM | POA: Diagnosis not present

## 2023-12-09 DIAGNOSIS — I509 Heart failure, unspecified: Secondary | ICD-10-CM | POA: Diagnosis not present

## 2024-01-06 DIAGNOSIS — J9621 Acute and chronic respiratory failure with hypoxia: Secondary | ICD-10-CM | POA: Diagnosis not present

## 2024-01-06 DIAGNOSIS — E1151 Type 2 diabetes mellitus with diabetic peripheral angiopathy without gangrene: Secondary | ICD-10-CM | POA: Diagnosis not present

## 2024-01-06 DIAGNOSIS — N1831 Chronic kidney disease, stage 3a: Secondary | ICD-10-CM | POA: Diagnosis not present

## 2024-01-06 DIAGNOSIS — D649 Anemia, unspecified: Secondary | ICD-10-CM | POA: Diagnosis not present

## 2024-01-18 ENCOUNTER — Other Ambulatory Visit: Payer: Self-pay | Admitting: Vascular Surgery

## 2024-01-18 DIAGNOSIS — Z1231 Encounter for screening mammogram for malignant neoplasm of breast: Secondary | ICD-10-CM

## 2024-01-24 DIAGNOSIS — N390 Urinary tract infection, site not specified: Secondary | ICD-10-CM | POA: Diagnosis not present

## 2024-01-28 DIAGNOSIS — Z79899 Other long term (current) drug therapy: Secondary | ICD-10-CM | POA: Diagnosis not present

## 2024-01-28 DIAGNOSIS — E11649 Type 2 diabetes mellitus with hypoglycemia without coma: Secondary | ICD-10-CM | POA: Diagnosis not present

## 2024-01-28 DIAGNOSIS — Z794 Long term (current) use of insulin: Secondary | ICD-10-CM | POA: Diagnosis not present

## 2024-01-30 DIAGNOSIS — M1990 Unspecified osteoarthritis, unspecified site: Secondary | ICD-10-CM | POA: Diagnosis not present

## 2024-01-30 DIAGNOSIS — E1122 Type 2 diabetes mellitus with diabetic chronic kidney disease: Secondary | ICD-10-CM | POA: Diagnosis not present

## 2024-01-30 DIAGNOSIS — E43 Unspecified severe protein-calorie malnutrition: Secondary | ICD-10-CM | POA: Diagnosis not present

## 2024-01-30 DIAGNOSIS — J9611 Chronic respiratory failure with hypoxia: Secondary | ICD-10-CM | POA: Diagnosis not present

## 2024-01-30 DIAGNOSIS — K5901 Slow transit constipation: Secondary | ICD-10-CM | POA: Diagnosis not present

## 2024-01-30 DIAGNOSIS — N1832 Chronic kidney disease, stage 3b: Secondary | ICD-10-CM | POA: Diagnosis not present

## 2024-01-30 DIAGNOSIS — I2510 Atherosclerotic heart disease of native coronary artery without angina pectoris: Secondary | ICD-10-CM | POA: Diagnosis not present

## 2024-01-30 DIAGNOSIS — E7800 Pure hypercholesterolemia, unspecified: Secondary | ICD-10-CM | POA: Diagnosis not present

## 2024-01-30 DIAGNOSIS — J984 Other disorders of lung: Secondary | ICD-10-CM | POA: Diagnosis not present

## 2024-01-30 DIAGNOSIS — I5042 Chronic combined systolic (congestive) and diastolic (congestive) heart failure: Secondary | ICD-10-CM | POA: Diagnosis not present

## 2024-01-30 DIAGNOSIS — E11319 Type 2 diabetes mellitus with unspecified diabetic retinopathy without macular edema: Secondary | ICD-10-CM | POA: Diagnosis not present

## 2024-01-30 DIAGNOSIS — E1165 Type 2 diabetes mellitus with hyperglycemia: Secondary | ICD-10-CM | POA: Diagnosis not present

## 2024-01-30 DIAGNOSIS — M419 Scoliosis, unspecified: Secondary | ICD-10-CM | POA: Diagnosis not present

## 2024-01-30 DIAGNOSIS — Z87440 Personal history of urinary (tract) infections: Secondary | ICD-10-CM | POA: Diagnosis not present

## 2024-01-30 DIAGNOSIS — Z794 Long term (current) use of insulin: Secondary | ICD-10-CM | POA: Diagnosis not present

## 2024-01-30 DIAGNOSIS — D631 Anemia in chronic kidney disease: Secondary | ICD-10-CM | POA: Diagnosis not present

## 2024-01-30 DIAGNOSIS — M810 Age-related osteoporosis without current pathological fracture: Secondary | ICD-10-CM | POA: Diagnosis not present

## 2024-01-30 DIAGNOSIS — I272 Pulmonary hypertension, unspecified: Secondary | ICD-10-CM | POA: Diagnosis not present

## 2024-01-30 DIAGNOSIS — G8929 Other chronic pain: Secondary | ICD-10-CM | POA: Diagnosis not present

## 2024-01-30 DIAGNOSIS — I480 Paroxysmal atrial fibrillation: Secondary | ICD-10-CM | POA: Diagnosis not present

## 2024-01-30 DIAGNOSIS — I130 Hypertensive heart and chronic kidney disease with heart failure and stage 1 through stage 4 chronic kidney disease, or unspecified chronic kidney disease: Secondary | ICD-10-CM | POA: Diagnosis not present

## 2024-01-30 DIAGNOSIS — I700 Atherosclerosis of aorta: Secondary | ICD-10-CM | POA: Diagnosis not present

## 2024-01-30 DIAGNOSIS — Z7984 Long term (current) use of oral hypoglycemic drugs: Secondary | ICD-10-CM | POA: Diagnosis not present

## 2024-01-30 DIAGNOSIS — Z8701 Personal history of pneumonia (recurrent): Secondary | ICD-10-CM | POA: Diagnosis not present

## 2024-01-31 DIAGNOSIS — M810 Age-related osteoporosis without current pathological fracture: Secondary | ICD-10-CM | POA: Diagnosis not present

## 2024-01-31 DIAGNOSIS — E113299 Type 2 diabetes mellitus with mild nonproliferative diabetic retinopathy without macular edema, unspecified eye: Secondary | ICD-10-CM | POA: Diagnosis not present

## 2024-01-31 DIAGNOSIS — N1832 Chronic kidney disease, stage 3b: Secondary | ICD-10-CM | POA: Diagnosis not present

## 2024-01-31 DIAGNOSIS — R5381 Other malaise: Secondary | ICD-10-CM | POA: Diagnosis not present

## 2024-01-31 DIAGNOSIS — I10 Essential (primary) hypertension: Secondary | ICD-10-CM | POA: Diagnosis not present

## 2024-01-31 DIAGNOSIS — C50912 Malignant neoplasm of unspecified site of left female breast: Secondary | ICD-10-CM | POA: Diagnosis not present

## 2024-01-31 DIAGNOSIS — I5032 Chronic diastolic (congestive) heart failure: Secondary | ICD-10-CM | POA: Diagnosis not present

## 2024-04-12 DEATH — deceased
